# Patient Record
Sex: Male | Born: 1975
Health system: Southern US, Community
[De-identification: ages and names within clinical notes are randomized; demographics above are authoritative.]

## PROBLEM LIST (undated history)

## (undated) DIAGNOSIS — E119 Type 2 diabetes mellitus without complications: Secondary | ICD-10-CM

## (undated) DIAGNOSIS — I10 Essential (primary) hypertension: Secondary | ICD-10-CM

## (undated) DIAGNOSIS — Q893 Situs inversus: Secondary | ICD-10-CM

## (undated) DIAGNOSIS — E669 Obesity, unspecified: Secondary | ICD-10-CM

## (undated) DIAGNOSIS — E785 Hyperlipidemia, unspecified: Secondary | ICD-10-CM

## (undated) DIAGNOSIS — J45909 Unspecified asthma, uncomplicated: Secondary | ICD-10-CM

## (undated) DIAGNOSIS — R809 Proteinuria, unspecified: Secondary | ICD-10-CM

## (undated) DIAGNOSIS — E1169 Type 2 diabetes mellitus with other specified complication: Secondary | ICD-10-CM

## (undated) HISTORY — DX: Obesity, unspecified: E66.9

## (undated) HISTORY — DX: Type 2 diabetes mellitus without complications: E11.9

## (undated) HISTORY — DX: Essential (primary) hypertension: I10

## (undated) HISTORY — DX: Proteinuria, unspecified: R80.9

## (undated) HISTORY — DX: Hyperlipidemia, unspecified: E78.5

## (undated) HISTORY — DX: Type 2 diabetes mellitus with other specified complication: E11.69

## (undated) HISTORY — PX: RHINOPLASTY: SUR1284

## (undated) HISTORY — PX: NASAL SINUS SURGERY: SHX719

## (undated) HISTORY — DX: Unspecified asthma, uncomplicated: J45.909

## (undated) HISTORY — PX: BILIARY TUBE PLACEMENT (ARMC HX): HXRAD1689

---

## 2004-10-17 ENCOUNTER — Emergency Department: Payer: Self-pay | Admitting: Internal Medicine

## 2005-03-19 ENCOUNTER — Emergency Department: Payer: Self-pay | Admitting: Emergency Medicine

## 2006-10-14 ENCOUNTER — Emergency Department: Payer: Self-pay | Admitting: Emergency Medicine

## 2007-05-20 ENCOUNTER — Emergency Department: Payer: Self-pay | Admitting: Emergency Medicine

## 2007-10-14 ENCOUNTER — Emergency Department: Payer: Self-pay | Admitting: Emergency Medicine

## 2008-06-16 ENCOUNTER — Emergency Department: Payer: Self-pay | Admitting: Emergency Medicine

## 2010-05-27 ENCOUNTER — Emergency Department: Payer: Self-pay | Admitting: Emergency Medicine

## 2012-02-05 ENCOUNTER — Emergency Department: Payer: Self-pay | Admitting: Emergency Medicine

## 2012-08-03 ENCOUNTER — Emergency Department: Payer: Self-pay | Admitting: Emergency Medicine

## 2013-03-19 ENCOUNTER — Emergency Department: Payer: Self-pay | Admitting: Emergency Medicine

## 2013-05-04 ENCOUNTER — Ambulatory Visit: Payer: Self-pay | Admitting: Emergency Medicine

## 2013-05-04 LAB — DOT URINE DIP
Blood: NEGATIVE
Glucose,UR: 100 mg/dL (ref 0–75)
Protein: NEGATIVE

## 2014-04-25 ENCOUNTER — Emergency Department: Payer: Self-pay | Admitting: Emergency Medicine

## 2014-08-18 ENCOUNTER — Emergency Department: Payer: Self-pay | Admitting: Emergency Medicine

## 2014-08-18 LAB — CBC
HCT: 40.1 % (ref 40.0–52.0)
HGB: 13.5 g/dL (ref 13.0–18.0)
MCH: 28.7 pg (ref 26.0–34.0)
MCHC: 33.6 g/dL (ref 32.0–36.0)
MCV: 86 fL (ref 80–100)
PLATELETS: 324 10*3/uL (ref 150–440)
RBC: 4.69 10*6/uL (ref 4.40–5.90)
RDW: 14 % (ref 11.5–14.5)
WBC: 11.5 10*3/uL — ABNORMAL HIGH (ref 3.8–10.6)

## 2014-08-18 LAB — COMPREHENSIVE METABOLIC PANEL
ALK PHOS: 84 U/L
ALT: 21 U/L
Albumin: 3.3 g/dL — ABNORMAL LOW (ref 3.4–5.0)
Anion Gap: 9 (ref 7–16)
BUN: 17 mg/dL (ref 7–18)
Bilirubin,Total: 0.5 mg/dL (ref 0.2–1.0)
CHLORIDE: 104 mmol/L (ref 98–107)
Calcium, Total: 9 mg/dL (ref 8.5–10.1)
Co2: 25 mmol/L (ref 21–32)
Creatinine: 1.12 mg/dL (ref 0.60–1.30)
EGFR (African American): >60
EGFR (Non-African Amer.): >60
Glucose: 131 mg/dL — ABNORMAL HIGH (ref 65–99)
Osmolality: 279 (ref 275–301)
POTASSIUM: 3.7 mmol/L (ref 3.5–5.1)
SGOT(AST): 26 U/L (ref 15–37)
SODIUM: 138 mmol/L (ref 136–145)
TOTAL PROTEIN: 7.9 g/dL (ref 6.4–8.2)

## 2014-08-18 LAB — APTT: ACTIVATED PTT: 31 s (ref 23.6–35.9)

## 2014-08-18 LAB — PROTIME-INR
INR: 1
PROTHROMBIN TIME: 12.9 s (ref 11.5–14.7)

## 2015-03-30 DIAGNOSIS — I10 Essential (primary) hypertension: Secondary | ICD-10-CM | POA: Insufficient documentation

## 2015-03-30 DIAGNOSIS — J4520 Mild intermittent asthma, uncomplicated: Secondary | ICD-10-CM | POA: Insufficient documentation

## 2015-03-30 DIAGNOSIS — R809 Proteinuria, unspecified: Secondary | ICD-10-CM | POA: Insufficient documentation

## 2015-03-30 DIAGNOSIS — J45909 Unspecified asthma, uncomplicated: Secondary | ICD-10-CM

## 2015-03-30 DIAGNOSIS — H9190 Unspecified hearing loss, unspecified ear: Secondary | ICD-10-CM | POA: Insufficient documentation

## 2015-03-30 DIAGNOSIS — E1169 Type 2 diabetes mellitus with other specified complication: Secondary | ICD-10-CM

## 2015-03-30 DIAGNOSIS — E785 Hyperlipidemia, unspecified: Secondary | ICD-10-CM | POA: Insufficient documentation

## 2015-03-30 DIAGNOSIS — E66811 Obesity, class 1: Secondary | ICD-10-CM | POA: Insufficient documentation

## 2015-03-30 DIAGNOSIS — E669 Obesity, unspecified: Secondary | ICD-10-CM | POA: Insufficient documentation

## 2015-03-30 HISTORY — DX: Proteinuria, unspecified: R80.9

## 2015-03-30 HISTORY — DX: Type 2 diabetes mellitus with other specified complication: E11.69

## 2015-03-30 HISTORY — DX: Unspecified asthma, uncomplicated: J45.909

## 2015-03-30 HISTORY — DX: Type 2 diabetes mellitus with other specified complication: E66.9

## 2015-03-30 HISTORY — DX: Essential (primary) hypertension: I10

## 2015-06-13 ENCOUNTER — Ambulatory Visit (INDEPENDENT_AMBULATORY_CARE_PROVIDER_SITE_OTHER): Payer: BLUE CROSS/BLUE SHIELD | Admitting: Family Medicine

## 2015-06-13 ENCOUNTER — Encounter (INDEPENDENT_AMBULATORY_CARE_PROVIDER_SITE_OTHER): Payer: Self-pay

## 2015-06-13 ENCOUNTER — Encounter: Payer: Self-pay | Admitting: Family Medicine

## 2015-06-13 VITALS — BP 114/80 | HR 58 | Temp 97.5°F | Ht 70.0 in | Wt 223.8 lb

## 2015-06-13 DIAGNOSIS — E119 Type 2 diabetes mellitus without complications: Secondary | ICD-10-CM | POA: Diagnosis not present

## 2015-06-13 DIAGNOSIS — E785 Hyperlipidemia, unspecified: Secondary | ICD-10-CM | POA: Diagnosis not present

## 2015-06-13 DIAGNOSIS — E669 Obesity, unspecified: Secondary | ICD-10-CM

## 2015-06-13 DIAGNOSIS — I10 Essential (primary) hypertension: Secondary | ICD-10-CM | POA: Diagnosis not present

## 2015-06-13 DIAGNOSIS — E1169 Type 2 diabetes mellitus with other specified complication: Secondary | ICD-10-CM

## 2015-06-13 MED ORDER — AMLODIPINE BESYLATE 10 MG PO TABS: 10.0000 mg | ORAL_TABLET | Freq: Every day | ORAL | Status: DC

## 2015-06-13 MED ORDER — ATORVASTATIN CALCIUM 20 MG PO TABS: 20.0000 mg | ORAL_TABLET | Freq: Every day | ORAL | Status: DC

## 2015-06-13 NOTE — Progress Notes (Signed)
Name: Andrew Sawyer   MRN: [id number]    DOB: 11-12-1976   Date:06/13/2015       Progress Note  Subjective  Chief Complaint  Chief Complaint  Patient presents with  . Follow-up    diabetes and hypertension    Diabetes He presents for his follow-up diabetic visit. He has type 2 diabetes mellitus. His disease course has been stable (Last A1c of 6.1%). There are no hypoglycemic associated symptoms. Pertinent negatives for hypoglycemia include no dizziness, headaches, hunger or nervousness/anxiousness. Pertinent negatives for diabetes include no chest pain, no fatigue, no foot paresthesias, no polydipsia and no polyuria. There are no hypoglycemic complications. There are no diabetic complications. Pertinent negatives for diabetic complications include no CVA. Current diabetic treatment includes oral agent (monotherapy). His weight is stable. He is following a diabetic diet. He rarely participates in exercise. His breakfast blood glucose is taken between 8-9 am. His breakfast blood glucose range is generally 110-130 mg/dl. An ACE inhibitor/angiotensin II receptor blocker is not being taken. He does not see a podiatrist.Eye exam is not current.  Hypertension This is a chronic problem. The problem is unchanged. The problem is controlled. Pertinent negatives include no chest pain, headaches, malaise/fatigue, palpitations or peripheral edema. Past treatments include calcium channel blockers. The current treatment provides significant improvement. There are no compliance problems.  There is no history of angina, kidney disease, CAD/MI or CVA.  Hyperlipidemia This is a chronic problem. The problem is uncontrolled. Recent lipid tests were reviewed and are high. Exacerbating diseases include diabetes and obesity. He has no history of hypothyroidism. Pertinent negatives include no chest pain. Current antihyperlipidemic treatment includes statins. The current treatment provides moderate improvement of lipids.  There are no compliance problems.       Past Medical History  Diagnosis Date  . Diabetes mellitus without complication   . Hyperlipidemia   . Hypertension     Past Surgical History  Procedure Laterality Date  . Rhinoplasty      Family History  Problem Relation Age of Onset  . Diabetes Mother   . Hypertension Father   . Diabetes Brother   . Depression Paternal Grandmother   . Hypertension Paternal Grandfather   . Heart disease Paternal Grandfather   . Cancer Paternal Grandfather     Prostate    History   Social History  . Marital Status: Married    Spouse Name: N/A  . Number of Children: N/A  . Years of Education: N/A   Occupational History  . Not on file.   Social History Main Topics  . Smoking status: Never Smoker   . Smokeless tobacco: Never Used  . Alcohol Use: No  . Drug Use: No  . Sexual Activity: Not on file   Other Topics Concern  . Not on file   Social History Narrative     Current outpatient prescriptions:  .  amLODipine (NORVASC) 10 MG tablet, Take by mouth., Disp: , Rfl:  .  atorvastatin (LIPITOR) 20 MG tablet, Take by mouth., Disp: , Rfl:  .  metFORMIN (GLUCOPHAGE) 500 MG tablet, Take by mouth., Disp: , Rfl:   No Known Allergies   Review of Systems  Constitutional: Negative for malaise/fatigue and fatigue.  Cardiovascular: Negative for chest pain and palpitations.  Neurological: Negative for dizziness and headaches.  Endo/Heme/Allergies: Negative for polydipsia.  Psychiatric/Behavioral: The patient is not nervous/anxious.       Objective  Filed Vitals:   06/13/15 0803  BP: 114/80  Pulse: 58  Temp:  97.5 F (36.4 C)  TempSrc: Oral  Height:  (1.778 m)  Weight: 223 lb 12.8 oz (101.515 kg)  SpO2: 95%    Physical Exam  Constitutional: He is well-developed, well-nourished, and in no distress.  HENT:  Head: Normocephalic and atraumatic.  Eyes: Conjunctivae are normal. Pupils are equal, round, and reactive to light.   Cardiovascular: Normal rate and regular rhythm.   Pulmonary/Chest: Effort normal and breath sounds normal.  Abdominal: Soft. Bowel sounds are normal.  Nursing note and vitals reviewed.      No results found for this or any previous visit (from the past 2160 hour(s)).   Assessment & Plan 1. Benign hypertension Continue present management blood pressure is well controlled. - amLODipine (NORVASC) 10 MG tablet; Take 1 tablet (10 mg total) by mouth daily.  Dispense: 90 tablet; Refill: 0  2. Diabetes mellitus type 2 in obese Repeat A1c and patient will continue on present dose of metformin. - HgB A1c  3. Dyslipidemia  - atorvastatin (LIPITOR) 20 MG tablet; Take 1 tablet (20 mg total) by mouth daily at 6 PM.  Dispense: 90 tablet; Refill: 0 - Lipid Profile - Comprehensive Metabolic Panel (CMET)  There are no diagnoses linked to this encounter.  Odelia Graciano Asad A[facility] 06/13/2015 8:19 AM

## 2015-06-14 ENCOUNTER — Other Ambulatory Visit: Payer: Self-pay | Admitting: *Deleted

## 2015-06-14 DIAGNOSIS — R748 Abnormal levels of other serum enzymes: Secondary | ICD-10-CM

## 2015-06-14 LAB — COMPREHENSIVE METABOLIC PANEL
A/G RATIO: 2 (ref 1.1–2.5)
ALK PHOS: 84 IU/L (ref 39–117)
ALT: 53 IU/L — ABNORMAL HIGH (ref 0–44)
AST: 29 IU/L (ref 0–40)
Albumin: 5 g/dL (ref 3.5–5.5)
BILIRUBIN TOTAL: 0.6 mg/dL (ref 0.0–1.2)
BUN / CREAT RATIO: 17 (ref 9–20)
BUN: 17 mg/dL (ref 6–24)
CO2: 24 mmol/L (ref 18–29)
CREATININE: 1.01 mg/dL (ref 0.76–1.27)
Calcium: 9.9 mg/dL (ref 8.7–10.2)
Chloride: 99 mmol/L (ref 97–108)
GFR calc non Af Amer: 93 mL/min/{1.73_m2} (ref >59)
GFR, EST AFRICAN AMERICAN: 107 mL/min/{1.73_m2} (ref >59)
GLOBULIN, TOTAL: 2.5 g/dL (ref 1.5–4.5)
Glucose: 122 mg/dL — ABNORMAL HIGH (ref 65–99)
Potassium: 4.6 mmol/L (ref 3.5–5.2)
Sodium: 141 mmol/L (ref 134–144)
Total Protein: 7.5 g/dL (ref 6.0–8.5)

## 2015-06-14 LAB — HEMOGLOBIN A1C
ESTIMATED AVERAGE GLUCOSE: 131 mg/dL
Hgb A1c MFr Bld: 6.2 % — ABNORMAL HIGH (ref 4.8–5.6)

## 2015-06-14 LAB — LIPID PANEL
Chol/HDL Ratio: 4.1 ratio units (ref 0.0–5.0)
Cholesterol, Total: 150 mg/dL (ref 100–199)
HDL: 37 mg/dL — ABNORMAL LOW (ref >39)
LDL Calculated: 81 mg/dL (ref 0–99)
Triglycerides: 159 mg/dL — ABNORMAL HIGH (ref 0–149)
VLDL Cholesterol Cal: 32 mg/dL (ref 5–40)

## 2015-06-14 NOTE — Progress Notes (Signed)
Called pt., informed of lab results and need to have CMP in 4 weeks.  Issued order for future labs.

## 2015-07-11 ENCOUNTER — Other Ambulatory Visit: Payer: Self-pay | Admitting: Family Medicine

## 2015-07-12 LAB — COMPREHENSIVE METABOLIC PANEL
ALBUMIN: 4.6 g/dL (ref 3.5–5.5)
ALT: 70 IU/L — AB (ref 0–44)
AST: 38 IU/L (ref 0–40)
Albumin/Globulin Ratio: 1.8 (ref 1.1–2.5)
Alkaline Phosphatase: 81 IU/L (ref 39–117)
BUN/Creatinine Ratio: 13 (ref 9–20)
BUN: 14 mg/dL (ref 6–24)
Bilirubin Total: 0.6 mg/dL (ref 0.0–1.2)
CALCIUM: 9.7 mg/dL (ref 8.7–10.2)
CO2: 22 mmol/L (ref 18–29)
Chloride: 101 mmol/L (ref 97–108)
Creatinine, Ser: 1.09 mg/dL (ref 0.76–1.27)
GFR calc non Af Amer: 84 mL/min/{1.73_m2} (ref >59)
GFR, EST AFRICAN AMERICAN: 98 mL/min/{1.73_m2} (ref >59)
GLOBULIN, TOTAL: 2.6 g/dL (ref 1.5–4.5)
Glucose: 130 mg/dL — ABNORMAL HIGH (ref 65–99)
Potassium: 4.5 mmol/L (ref 3.5–5.2)
Sodium: 141 mmol/L (ref 134–144)
TOTAL PROTEIN: 7.2 g/dL (ref 6.0–8.5)

## 2015-07-25 ENCOUNTER — Encounter: Payer: Self-pay | Admitting: Family Medicine

## 2015-07-25 ENCOUNTER — Ambulatory Visit (INDEPENDENT_AMBULATORY_CARE_PROVIDER_SITE_OTHER): Payer: BLUE CROSS/BLUE SHIELD | Admitting: Family Medicine

## 2015-07-25 VITALS — BP 119/68 | HR 66 | Temp 97.9°F | Resp 19 | Ht 70.0 in | Wt 223.6 lb

## 2015-07-25 DIAGNOSIS — R748 Abnormal levels of other serum enzymes: Secondary | ICD-10-CM | POA: Insufficient documentation

## 2015-07-25 NOTE — Progress Notes (Signed)
Name: Andrew Sawyer   MRN: [id number]    DOB: [birth year]   Date:07/25/2015       Progress Note  Subjective  Chief Complaint  Chief Complaint  Patient presents with  . Annual Exam    CPE  . Diabetes  . Asthma  . Hyperlipidemia  . Hypertension    HPI Elevated liver enzymes. ALT is progressively elevated from 53 in June 2016 to 70 in July 2016. He has no hx of liver disease know, denies alcohol use, takes Tylenol 500 mg approx 10 times a month. No nausea, vomiting, jaundice, abdominal pain, or other symptoms. Past Medical History  Diagnosis Date  . Diabetes mellitus without complication   . Hyperlipidemia   . Hypertension     Past Surgical History  Procedure Laterality Date  . Rhinoplasty      Family History  Problem Relation Age of Onset  . Diabetes Mother   . Hypertension Father   . Diabetes Brother   . Depression Paternal Grandmother   . Hypertension Paternal Grandfather   . Heart disease Paternal Grandfather   . Cancer Paternal Grandfather     Prostate    History   Social History  . Marital Status: Married    Spouse Name: N/A  . Number of Children: N/A  . Years of Education: N/A   Occupational History  . Not on file.   Social History Main Topics  . Smoking status: Never Smoker   . Smokeless tobacco: Never Used  . Alcohol Use: No  . Drug Use: No  . Sexual Activity: Not on file   Other Topics Concern  . Not on file   Social History Narrative     Current outpatient prescriptions:  .  amLODipine (NORVASC) 10 MG tablet, Take 1 tablet (10 mg total) by mouth daily., Disp: 90 tablet, Rfl: 0 .  aspirin 81 MG chewable tablet, Chew 81 mg by mouth daily., Disp: , Rfl:  .  atorvastatin (LIPITOR) 20 MG tablet, Take 1 tablet (20 mg total) by mouth daily at 6 PM., Disp: 90 tablet, Rfl: 0 .  metFORMIN (GLUCOPHAGE) 500 MG tablet, Take by mouth., Disp: , Rfl:   No Known Allergies   Review of Systems  Constitutional: Negative for fever, chills and weight  loss.  Gastrointestinal: Negative for nausea, vomiting, abdominal pain and blood in stool.  Genitourinary: Negative for dysuria, urgency and frequency.  Musculoskeletal: Negative for back pain and neck pain.     Objective  Filed Vitals:   07/25/15 1137  BP: 119/68  Pulse: 66  Temp: 97.9 F (36.6 C)  TempSrc: Oral  Resp: 19  Height: 5\' 10"  (1.778 m)  Weight: 223 lb 9.6 oz (101.424 kg)  SpO2: 96%    Physical Exam  Constitutional: He is well-developed, well-nourished, and in no distress.  Cardiovascular: Normal rate and regular rhythm.   Pulmonary/Chest: Effort normal and breath sounds normal.  Abdominal: Soft. Bowel sounds are normal. There is no hepatomegaly. There is no tenderness.  Pt. Has situs inversus and he has been told that all internal organs are opposite of where they should be.  Nursing note and vitals reviewed.    Assessment & Plan 1. Elevated liver enzymes Obtain workup for elevated liver enzymes. We will advise on statin therapy after obtaining repeat enzymes today. - Basic Metabolic Panel (BMET) - Hepatic function panel - Gamma GT - Hepatitis panel, acute - US Abdomen Complete; Future    Sayda Grable [facility] 07/25/2015 12:29 PM

## 2015-07-26 LAB — HEPATIC FUNCTION PANEL
ALK PHOS: 94 IU/L (ref 39–117)
ALT: 79 IU/L — AB (ref 0–44)
AST: 43 IU/L — AB (ref 0–40)
Albumin: 5.1 g/dL (ref 3.5–5.5)
Bilirubin Total: 0.6 mg/dL (ref 0.0–1.2)
Bilirubin, Direct: 0.18 mg/dL (ref 0.00–0.40)
TOTAL PROTEIN: 8 g/dL (ref 6.0–8.5)

## 2015-07-26 LAB — BASIC METABOLIC PANEL
BUN/Creatinine Ratio: 16 (ref 9–20)
BUN: 16 mg/dL (ref 6–24)
CHLORIDE: 97 mmol/L (ref 97–108)
CO2: 25 mmol/L (ref 18–29)
CREATININE: 1 mg/dL (ref 0.76–1.27)
Calcium: 10.3 mg/dL — ABNORMAL HIGH (ref 8.7–10.2)
GFR calc Af Amer: 108 mL/min/{1.73_m2} (ref >59)
GFR, EST NON AFRICAN AMERICAN: 94 mL/min/{1.73_m2} (ref >59)
Glucose: 98 mg/dL (ref 65–99)
POTASSIUM: 4.8 mmol/L (ref 3.5–5.2)
SODIUM: 139 mmol/L (ref 134–144)

## 2015-07-26 LAB — HEPATITIS PANEL, ACUTE
Hep A IgM: NEGATIVE
Hep B C IgM: NEGATIVE
Hepatitis B Surface Ag: NEGATIVE

## 2015-07-26 LAB — GAMMA GT: GGT: 72 IU/L — AB (ref 0–65)

## 2015-07-29 ENCOUNTER — Ambulatory Visit
Admission: RE | Admit: 2015-07-29 | Discharge: 2015-07-29 | Disposition: A | Discharge status: Home / Self Care | Payer: BLUE CROSS/BLUE SHIELD | Source: Ambulatory Visit | Attending: Family Medicine | Admitting: Family Medicine

## 2015-07-29 DIAGNOSIS — R945 Abnormal results of liver function studies: Secondary | ICD-10-CM | POA: Insufficient documentation

## 2015-07-29 DIAGNOSIS — Q893 Situs inversus: Secondary | ICD-10-CM | POA: Diagnosis present

## 2015-07-29 DIAGNOSIS — K769 Liver disease, unspecified: Secondary | ICD-10-CM | POA: Diagnosis not present

## 2015-07-29 DIAGNOSIS — R748 Abnormal levels of other serum enzymes: Secondary | ICD-10-CM

## 2015-08-15 ENCOUNTER — Other Ambulatory Visit: Payer: Self-pay | Admitting: Family Medicine

## 2015-08-15 DIAGNOSIS — R748 Abnormal levels of other serum enzymes: Secondary | ICD-10-CM

## 2015-08-16 LAB — COMPREHENSIVE METABOLIC PANEL
ALBUMIN: 4.6 g/dL (ref 3.5–5.5)
ALT: 89 IU/L — ABNORMAL HIGH (ref 0–44)
AST: 43 IU/L — AB (ref 0–40)
Albumin/Globulin Ratio: 1.7 (ref 1.1–2.5)
Alkaline Phosphatase: 80 IU/L (ref 39–117)
BUN / CREAT RATIO: 18 (ref 9–20)
BUN: 19 mg/dL (ref 6–24)
Bilirubin Total: 0.5 mg/dL (ref 0.0–1.2)
CALCIUM: 9.5 mg/dL (ref 8.7–10.2)
CO2: 23 mmol/L (ref 18–29)
CREATININE: 1.06 mg/dL (ref 0.76–1.27)
Chloride: 99 mmol/L (ref 97–108)
GFR calc Af Amer: 101 mL/min/{1.73_m2} (ref >59)
GFR calc non Af Amer: 87 mL/min/{1.73_m2} (ref >59)
GLOBULIN, TOTAL: 2.7 g/dL (ref 1.5–4.5)
Glucose: 129 mg/dL — ABNORMAL HIGH (ref 65–99)
Potassium: 4.3 mmol/L (ref 3.5–5.2)
SODIUM: 141 mmol/L (ref 134–144)
Total Protein: 7.3 g/dL (ref 6.0–8.5)

## 2015-08-17 NOTE — Addendum Note (Signed)
Addended bySherryll Burger, Charon Akamine A A on: 08/17/2015 06:33 PM   Modules accepted: Orders

## 2015-08-19 ENCOUNTER — Encounter: Payer: Self-pay | Admitting: Gastroenterology

## 2015-08-21 ENCOUNTER — Emergency Department: Payer: BLUE CROSS/BLUE SHIELD

## 2015-08-21 ENCOUNTER — Encounter: Payer: Self-pay | Admitting: Emergency Medicine

## 2015-08-21 ENCOUNTER — Emergency Department
Admission: EM | Admit: 2015-08-21 | Discharge: 2015-08-21 | Disposition: A | Discharge status: Home / Self Care | Payer: BLUE CROSS/BLUE SHIELD | Attending: Emergency Medicine | Admitting: Emergency Medicine

## 2015-08-21 DIAGNOSIS — E119 Type 2 diabetes mellitus without complications: Secondary | ICD-10-CM | POA: Insufficient documentation

## 2015-08-21 DIAGNOSIS — Z79899 Other long term (current) drug therapy: Secondary | ICD-10-CM | POA: Diagnosis not present

## 2015-08-21 DIAGNOSIS — M545 Low back pain, unspecified: Secondary | ICD-10-CM

## 2015-08-21 DIAGNOSIS — Z87828 Personal history of other (healed) physical injury and trauma: Secondary | ICD-10-CM | POA: Diagnosis not present

## 2015-08-21 DIAGNOSIS — Z7982 Long term (current) use of aspirin: Secondary | ICD-10-CM | POA: Insufficient documentation

## 2015-08-21 DIAGNOSIS — I10 Essential (primary) hypertension: Secondary | ICD-10-CM | POA: Insufficient documentation

## 2015-08-21 MED ORDER — DIAZEPAM 2 MG PO TABS
2.0000 mg | ORAL_TABLET | Freq: Once | ORAL | Status: AC
  Administered 2015-08-21: 2 mg via ORAL
  Filled 2015-08-21: qty 1

## 2015-08-21 MED ORDER — ETODOLAC 400 MG PO TABS: 400.0000 mg | ORAL_TABLET | Freq: Two times a day (BID) | ORAL | Status: DC

## 2015-08-21 MED ORDER — KETOROLAC TROMETHAMINE 60 MG/2ML IM SOLN
60.0000 mg | Freq: Once | INTRAMUSCULAR | Status: AC
  Administered 2015-08-21: 60 mg via INTRAMUSCULAR
  Filled 2015-08-21: qty 2

## 2015-08-21 NOTE — ED Notes (Signed)
AAOx3.  Skin warm and dry.  NAD.  Ambulates with easy and steady gait.  Moving all extremities.  Posture relaxed.

## 2015-08-21 NOTE — ED Provider Notes (Signed)
[facility] Emergency Department Provider Note  ____________________________________________  Time seen: Approximately 9:30 PM  I have reviewed the triage vital signs and the nursing notes.   HISTORY  Chief Complaint Back Pain   HPI Andrew Sawyer is a 39 y.o. male is here with complaint of back pain for the last week. He states he has not had any recent injury but does recall he had an injury 15 years ago. He generally sees chiropractor for this. He has taken occasional over-the-counter medication but not recently. He denies any urinary symptoms. There is been no incontinence of bowel or bladder. He denies any paresthesias into his lower extremities. He continues to walk without any difficulty. Currently his pain is 7 out of 10.   Past Medical History  Diagnosis Date  . Diabetes mellitus without complication   . Hyperlipidemia   . Hypertension     Patient Active Problem List   Diagnosis Date Noted  . Elevated liver enzymes 07/25/2015  . Asthma, mild 03/30/2015  . Diabetes mellitus type 2 in obese 03/30/2015  . Routine general medical examination at a health care facility 03/30/2015  . Difficulty hearing 03/30/2015  . Brash 03/30/2015  . Elevated cholesterol with elevated triglycerides 03/30/2015  . Blood glucose elevated 03/30/2015  . Benign hypertension 03/30/2015  . Flu vaccine need 03/30/2015  . Adiposity 03/30/2015  . Pneumococcal vaccination given 03/30/2015  . Abnormal presence of protein in urine 03/30/2015    Past Surgical History  Procedure Laterality Date  . Rhinoplasty    . Nasal sinus surgery      Current Outpatient Rx  Name  Route  Sig  Dispense  Refill  . amLODipine (NORVASC) 10 MG tablet   Oral   Take 1 tablet (10 mg total) by mouth daily.   90 tablet   0   . aspirin 81 MG chewable tablet   Oral   Chew 81 mg by mouth daily.         Marland Kitchen atorvastatin (LIPITOR) 20 MG tablet   Oral   Take 1 tablet (20 mg total) by  mouth daily at 6 PM.   90 tablet   0   . etodolac (LODINE) 400 MG tablet   Oral   Take 1 tablet (400 mg total) by mouth 2 (two) times daily.   20 tablet   0   . metFORMIN (GLUCOPHAGE) 500 MG tablet   Oral   Take by mouth.           Allergies Review of patient's allergies indicates no known allergies.  Family History  Problem Relation Age of Onset  . Diabetes Mother   . Hypertension Father   . Diabetes Brother   . Depression Paternal Grandmother   . Hypertension Paternal Grandfather   . Heart disease Paternal Grandfather   . Cancer Paternal Grandfather     Prostate    Social History Social History  Substance Use Topics  . Smoking status: Never Smoker   . Smokeless tobacco: Never Used  . Alcohol Use: No    Review of Systems Constitutional: No fever/chills Cardiovascular: Denies chest pain. Respiratory: Denies shortness of breath. Gastrointestinal: No abdominal pain.  No nausea, no vomiting.  Genitourinary: Negative for dysuria. Musculoskeletal: Positive for back pain. Skin: Negative for rash. Neurological: Negative for headaches, focal weakness or numbness. 10-point ROS otherwise negative.  ____________________________________________   PHYSICAL EXAM:  VITAL SIGNS: ED Triage Vitals  Enc Vitals Group     BP 08/21/15 2038 132/83 mmHg  Pulse Rate 08/21/15 2038 85     Resp 08/21/15 2038 18     Temp 08/21/15 2038 98.2 F (36.8 C)     Temp Source 08/21/15 2038 Oral     SpO2 08/21/15 2038 96 %     Weight 08/21/15 2038 220 lb (99.791 kg)     Height 08/21/15 2038 5\' 10"  (1.778 m)     Head Cir --      Peak Flow --      Pain Score 08/21/15 2039 7     Pain Loc --      Pain Edu? --      Excl. in GC? --     Constitutional: Alert and oriented. Well appearing and in no acute distress. Eyes: Conjunctivae are normal. PERRL. EOMI. Head: Atraumatic. Nose: No congestion/rhinnorhea. Neck: No stridor.   Cardiovascular: Normal rate, regular rhythm. Grossly  normal heart sounds.  Good peripheral circulation. Respiratory: Normal respiratory effort.  No retractions. Lungs CTAB. Gastrointestinal: Soft and nontender. No distention.  No CVA tenderness. Musculoskeletal: Back exam no gross deformity but range of motion is slightly restricted secondary to pain. There is no active muscle spasm seen. Moderate tenderness on palpation of the lumbar spine along with bilateral paravertebral muscles. Straight leg raises were negative. No lower extremity tenderness nor edema.  No joint effusions. Neurologic:  Normal speech and language. No gross focal neurologic deficits are appreciated. No gait instability. Reflexes 1+ bilaterally Skin:  Skin is warm, dry and intact. No rash noted. Psychiatric: Mood and affect are normal. Speech and behavior are normal.  ____________________________________________   LABS (all labs ordered are listed, but only abnormal results are displayed)  Labs Reviewed - No data to display _RADIOLOGY  Lumbar spine per radiologist negative ____________________________________________   PROCEDURES  Procedure(s) performed: None  Critical Care performed: No  ____________________________________________   INITIAL IMPRESSION / ASSESSMENT AND PLAN / ED COURSE  Pertinent labs & imaging results that were available during my care of the patient were reviewed by me and considered in my medical decision making (see chart for details).  Patient was reassured that his x-rays were normal. Patient was started on etodolac after receiving Toradol 60 mg IM in the emergency room. He is to follow-up with Dr. Hyacinth Meeker if any continued problems with his back. ____________________________________________   FINAL CLINICAL IMPRESSION(S) / ED DIAGNOSES  Final diagnoses:  Bilateral low back pain without sciatica      Tommi Rumps, PA-C 08/21/15 2302  Emily Filbert, MD 08/22/15 [phone]

## 2015-08-21 NOTE — ED Notes (Signed)
Pt. States chronic back pain worse in the past week.

## 2015-09-12 ENCOUNTER — Ambulatory Visit: Payer: BLUE CROSS/BLUE SHIELD | Admitting: Family Medicine

## 2015-09-12 ENCOUNTER — Other Ambulatory Visit: Payer: Self-pay | Admitting: Family Medicine

## 2015-09-12 ENCOUNTER — Telehealth: Payer: Self-pay | Admitting: Family Medicine

## 2015-09-12 DIAGNOSIS — E669 Obesity, unspecified: Secondary | ICD-10-CM

## 2015-09-12 DIAGNOSIS — I10 Essential (primary) hypertension: Secondary | ICD-10-CM

## 2015-09-12 DIAGNOSIS — E1169 Type 2 diabetes mellitus with other specified complication: Secondary | ICD-10-CM

## 2015-09-12 MED ORDER — AMLODIPINE BESYLATE 10 MG PO TABS: 10.0000 mg | ORAL_TABLET | Freq: Every day | ORAL | Status: DC

## 2015-09-12 MED ORDER — METFORMIN HCL 500 MG PO TABS: 500.0000 mg | ORAL_TABLET | Freq: Two times a day (BID) | ORAL | Status: DC

## 2015-09-12 NOTE — Telephone Encounter (Signed)
Prescription for metformin and amlodipine sent to pharmacy

## 2015-09-12 NOTE — Telephone Encounter (Signed)
Patient is completely out of Metformin  and amlodipine . Please send to [facility] [address]. 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

## 2015-09-12 NOTE — Telephone Encounter (Signed)
Pt aware that medication refill has been completed.[place]

## 2015-09-26 ENCOUNTER — Encounter: Payer: Self-pay | Admitting: Family Medicine

## 2015-09-26 ENCOUNTER — Other Ambulatory Visit: Payer: Self-pay

## 2015-09-26 ENCOUNTER — Encounter: Payer: Self-pay | Admitting: Gastroenterology

## 2015-09-26 ENCOUNTER — Ambulatory Visit (INDEPENDENT_AMBULATORY_CARE_PROVIDER_SITE_OTHER): Payer: BLUE CROSS/BLUE SHIELD | Admitting: Gastroenterology

## 2015-09-26 ENCOUNTER — Ambulatory Visit (INDEPENDENT_AMBULATORY_CARE_PROVIDER_SITE_OTHER): Payer: BLUE CROSS/BLUE SHIELD | Admitting: Family Medicine

## 2015-09-26 VITALS — BP 143/75 | HR 78 | Temp 97.9°F | Ht 70.0 in | Wt 226.0 lb

## 2015-09-26 VITALS — BP 130/77 | HR 87 | Temp 98.1°F | Resp 18 | Ht 70.0 in | Wt 226.3 lb

## 2015-09-26 DIAGNOSIS — I10 Essential (primary) hypertension: Secondary | ICD-10-CM | POA: Diagnosis not present

## 2015-09-26 DIAGNOSIS — E669 Obesity, unspecified: Secondary | ICD-10-CM | POA: Diagnosis not present

## 2015-09-26 DIAGNOSIS — E1169 Type 2 diabetes mellitus with other specified complication: Secondary | ICD-10-CM

## 2015-09-26 DIAGNOSIS — R748 Abnormal levels of other serum enzymes: Secondary | ICD-10-CM | POA: Diagnosis not present

## 2015-09-26 DIAGNOSIS — E119 Type 2 diabetes mellitus without complications: Secondary | ICD-10-CM | POA: Diagnosis not present

## 2015-09-26 LAB — POCT GLYCOSYLATED HEMOGLOBIN (HGB A1C): Hemoglobin A1C: 5.9

## 2015-09-26 NOTE — Progress Notes (Signed)
Gastroenterology Consultation  Referring Provider:     Ellyn Hack, MD Primary Care Physician:  Brayton El, MD Primary Gastroenterologist:  Dr. Servando Snare     Reason for Consultation:     Abnormal Liver enzymes        HPI:   Andrew Sawyer is a 39 y.o. y/o male referred for consultation & management of abnormal liver enzymes by Dr. Brayton El, MD.  This patient comes today with a history of Situs inversus totalis. He was found to have abnormal liver enzymes. The patient suffers from hypercholesterolemia and diabetes. The patient is also overweight. The patient denies any alcohol abuse. There is no report of any abdominal pain nausea vomiting fevers or chills. The patient does report that he has long-standing heartburn controlled by diet and over-the-counter acid suppression medication. The patient has not had any unexplained weight loss. He denies ever being told that he had abnormal liver enzymes in the past. The patient's hepatitis A, B, and C were tested and were negative.  Past Medical History  Diagnosis Date  . Diabetes mellitus without complication (HCC)   . Hyperlipidemia   . Hypertension   . Benign hypertension 03/30/2015  . Asthma, mild 03/30/2015  . Diabetes mellitus type 2 in obese (HCC) 03/30/2015    Past Surgical History  Procedure Laterality Date  . Rhinoplasty    . Nasal sinus surgery      Prior to Admission medications   Medication Sig Start Date End Date Taking? Authorizing Provider  amLODipine (NORVASC) 10 MG tablet Take 1 tablet (10 mg total) by mouth daily. 09/12/15  Yes Ellyn Hack, MD  aspirin 81 MG chewable tablet Chew 81 mg by mouth daily.   Yes Historical Provider, MD  metFORMIN (GLUCOPHAGE) 500 MG tablet Take 1 tablet (500 mg total) by mouth 2 (two) times daily with a meal. 09/12/15  Yes Ellyn Hack, MD  atorvastatin (LIPITOR) 20 MG tablet Take 1 tablet (20 mg total) by mouth daily at 6 PM. Patient not taking: Reported on 09/26/2015 06/13/15   Ellyn Hack, MD  etodolac (LODINE) 400 MG tablet Take 1 tablet (400 mg total) by mouth 2 (two) times daily. Patient not taking: Reported on 09/26/2015 08/21/15   Tommi Rumps, PA-C    Family History  Problem Relation Age of Onset  . Diabetes Mother   . Hypertension Father   . Diabetes Brother   . Depression Paternal Grandmother   . Hypertension Paternal Grandfather   . Heart disease Paternal Grandfather   . Cancer Paternal Grandfather     Prostate     Social History  Substance Use Topics  . Smoking status: Never Smoker   . Smokeless tobacco: Never Used  . Alcohol Use: No    Allergies as of 09/26/2015  . (No Known Allergies)    Review of Systems:    All systems reviewed and negative except where noted in HPI.   Physical Exam:  BP 143/75 mmHg  Pulse 78  Temp(Src) 97.9 F (36.6 C) (Oral)  Ht  (1.778 m)  Wt 226 lb (102.513 kg)  BMI 32.43 kg/m2 No LMP for male patient. Psych:  Alert and cooperative. Normal mood and affect. General:   Alert,  Well-developed, well-nourished, pleasant and cooperative in NAD Head:  Normocephalic and atraumatic. Eyes:  Sclera clear, no icterus.   Conjunctiva pink. Ears:  Normal auditory acuity. Nose:  No deformity, discharge, or lesions. Mouth:  No deformity  or lesions,oropharynx pink & moist. Neck:  Supple; no masses or thyromegaly. Lungs:  Respirations even and unlabored.  Clear throughout to auscultation.   No wheezes, crackles, or rhonchi. No acute distress. Heart:  Regular rate and rhythm; no murmurs, clicks, rubs, or gallops. Of note the heart was on the opposite side Abdomen:  Normal bowel sounds.  No bruits.  Soft, non-tender and non-distended without masses, hepatosplenomegaly or hernias noted.  No guarding or rebound tenderness.  Negative Carnett sign.  Of note the intestinal organs were on the opposite side Rectal:  Deferred.  Msk:  Symmetrical without gross deformities.  Good, equal movement & strength bilaterally. Pulses:   Normal pulses noted. Extremities:  No clubbing or edema.  No cyanosis. Neurologic:  Alert and oriented x3;  grossly normal neurologically. Skin:  Intact without significant lesions or rashes.  No jaundice. Lymph Nodes:  No significant cervical adenopathy. Psych:  Alert and cooperative. Normal mood and affect.  Imaging Studies: No results found.  Assessment and Plan:   Andrew Sawyer is a 39 y.o. y/o male who comes in today with a ultrasound showed fatty liver and abnormal liver enzymes. The patient also has hypercholesterolemia and diabetes. The patient will have his blood sent off for other possible causes of abnormal liver enzymes. He will also have his liver enzymes checked again. If everything is negative the patient will likely have nonalcoholic steatohepatitis and has been told to keep his cholesterol diabetes and weight under control to best decrease the amount of fat in his liver. The patient has been explained the plan and agrees with it.   Note: This dictation was prepared with Dragon dictation along with smaller phrase technology. Any transcriptional errors that result from this process are unintentional.

## 2015-09-26 NOTE — Progress Notes (Signed)
Name: Andrew Sawyer   MRN: [id number]    DOB: [birth year]   Date:09/26/2015       Progress Note  Subjective  Chief Complaint  Chief Complaint  Patient presents with  . Follow-up    3 mo  . Diabetes    Diabetes He presents for his follow-up diabetic visit. He has type 2 diabetes mellitus. There are no hypoglycemic associated symptoms. Pertinent negatives for hypoglycemia include no headaches. Pertinent negatives for diabetes include no blurred vision, no chest pain, no fatigue, no polydipsia and no polyuria. Pertinent negatives for diabetic complications include no CVA. Current diabetic treatment includes oral agent (monotherapy). His weight is stable. His breakfast blood glucose range is generally 110-130 mg/dl.  Hypertension This is a chronic problem. The problem is unchanged. The problem is controlled. Pertinent negatives include no blurred vision, chest pain, headaches, palpitations or shortness of breath. Past treatments include calcium channel blockers. There is no history of kidney disease, CAD/MI or CVA.  Hyperlipidemia This is a chronic problem. The problem is controlled. Recent lipid tests were reviewed and are normal. Exacerbating diseases include diabetes. Pertinent negatives include no chest pain or shortness of breath. Current antihyperlipidemic treatment includes statins (Lipitor on hold until work up for elevated liver enzymes is complete.).   Past Medical History  Diagnosis Date  . Diabetes mellitus without complication (HCC)   . Hyperlipidemia   . Hypertension   . Benign hypertension 03/30/2015  . Asthma, mild 03/30/2015  . Diabetes mellitus type 2 in obese (HCC) 03/30/2015    Past Surgical History  Procedure Laterality Date  . Rhinoplasty    . Nasal sinus surgery     Family History  Problem Relation Age of Onset  . Diabetes Mother   . Hypertension Father   . Diabetes Brother   . Depression Paternal Grandmother   . Hypertension Paternal Grandfather   . Heart  disease Paternal Grandfather   . Cancer Paternal Grandfather     Prostate    Social History   Social History  . Marital Status: Married    Spouse Name: N/A  . Number of Children: N/A  . Years of Education: N/A   Occupational History  . Not on file.   Social History Main Topics  . Smoking status: Never Smoker   . Smokeless tobacco: Never Used  . Alcohol Use: No  . Drug Use: No  . Sexual Activity: Not on file   Other Topics Concern  . Not on file   Social History Narrative     Current outpatient prescriptions:  .  amLODipine (NORVASC) 10 MG tablet, Take 1 tablet (10 mg total) by mouth daily., Disp: 90 tablet, Rfl: 0 .  aspirin 81 MG chewable tablet, Chew 81 mg by mouth daily., Disp: , Rfl:  .  atorvastatin (LIPITOR) 20 MG tablet, Take 1 tablet (20 mg total) by mouth daily at 6 PM., Disp: 90 tablet, Rfl: 0 .  etodolac (LODINE) 400 MG tablet, Take 1 tablet (400 mg total) by mouth 2 (two) times daily., Disp: 20 tablet, Rfl: 0 .  metFORMIN (GLUCOPHAGE) 500 MG tablet, Take 1 tablet (500 mg total) by mouth 2 (two) times daily with a meal., Disp: 180 tablet, Rfl: 0  No Known Allergies  Review of Systems  Constitutional: Negative for fatigue.  Eyes: Negative for blurred vision.  Respiratory: Negative for shortness of breath.   Cardiovascular: Negative for chest pain and palpitations.  Neurological: Negative for headaches.  Endo/Heme/Allergies: Negative for polydipsia.  Objective  Filed Vitals:   09/26/15 1231  BP: 130/77  Pulse: 87  Temp: 98.1 F (36.7 C)  TempSrc: Oral  Resp: 18  Height:  (1.778 m)  Weight: 226 lb 4.8 oz (102.649 kg)  SpO2: 94%    Physical Exam  Constitutional: He is well-developed, well-nourished, and in no distress.  HENT:  Head: Normocephalic and atraumatic.  Cardiovascular: Normal rate and regular rhythm.   Pulmonary/Chest: Effort normal and breath sounds normal.  Nursing note and vitals reviewed.  Assessment & Plan  1.  Diabetes mellitus type 2 in obese (HCC)  - POCT HgB A1C  2. Benign hypertension Blood pressure is stable and controlled on present therapy.  3. Elevated liver enzymes Patient he'll follow up with gastroenterology for elevated liver enzymes. Lipitor is on hold for now until further workup is complete.     Savahna Casados Asad A[facility] 09/26/2015 12:54 PM

## 2015-09-27 LAB — FERRITIN: FERRITIN: 690 ng/mL — AB (ref 30–400)

## 2015-09-27 LAB — HEPATIC FUNCTION PANEL
ALBUMIN: 4.8 g/dL (ref 3.5–5.5)
ALK PHOS: 86 IU/L (ref 39–117)
ALT: 80 IU/L — ABNORMAL HIGH (ref 0–44)
AST: 48 IU/L — ABNORMAL HIGH (ref 0–40)
BILIRUBIN TOTAL: 0.7 mg/dL (ref 0.0–1.2)
BILIRUBIN, DIRECT: 0.18 mg/dL (ref 0.00–0.40)
TOTAL PROTEIN: 7.8 g/dL (ref 6.0–8.5)

## 2015-09-27 LAB — CERULOPLASMIN: CERULOPLASMIN: 29.2 mg/dL (ref 16.0–31.0)

## 2015-09-27 LAB — EXTRACTABLE NUCLEAR ANTIGEN ANTIBODY
ENA RNP Ab: 0.6 AI (ref 0.0–0.9)
ENA SM Ab Ser-aCnc: <0.2 AI (ref 0.0–0.9)
ENA SSA (RO) Ab: <0.2 AI (ref 0.0–0.9)
ENA SSB (LA) Ab: <0.2 AI (ref 0.0–0.9)
Scleroderma SCL-70: <0.2 AI (ref 0.0–0.9)

## 2015-09-27 LAB — ALPHA-1-ANTITRYPSIN: A1 ANTITRYPSIN: 131 mg/dL (ref 90–200)

## 2015-09-27 LAB — IRON AND TIBC
IRON SATURATION: 19 % (ref 15–55)
Iron: 62 ug/dL (ref 38–169)
Total Iron Binding Capacity: 319 ug/dL (ref 250–450)
UIBC: 257 ug/dL (ref 111–343)

## 2015-09-27 LAB — ANTI-SMOOTH MUSCLE ANTIBODY, IGG: SMOOTH MUSCLE AB: 9 U (ref 0–19)

## 2015-09-27 LAB — MITOCHONDRIAL ANTIBODIES: Mitochondrial Ab: 23.1 Units — ABNORMAL HIGH (ref 0.0–20.0)

## 2015-10-04 ENCOUNTER — Other Ambulatory Visit: Payer: Self-pay

## 2015-10-04 ENCOUNTER — Telehealth: Payer: Self-pay

## 2015-10-04 DIAGNOSIS — R748 Abnormal levels of other serum enzymes: Secondary | ICD-10-CM

## 2015-10-04 NOTE — Telephone Encounter (Signed)
Pt notified of results. Contacted Revonda Standard in Radiology to schedule liver biopsy. She will call me back this afternoon with date.

## 2015-10-04 NOTE — Telephone Encounter (Signed)
Liver biopsy scheduled for Monday, Oct 17th at 10:00am at [facility]. Pt was given instructions for prep as well as date, time and location. All questions were answered.

## 2015-10-04 NOTE — Telephone Encounter (Signed)
-----   Message from Darren Wohl, MD sent at 10/04/2015  6:14 AM EDT ----- Let the patient know that one of his blood test was weakly positive for another possible cause of the increased liver enzymes besides fatty liver. The treatment would be different and he should be set up for a liver biopsy to see if he has it. 

## 2015-10-05 ENCOUNTER — Telehealth: Payer: Self-pay

## 2015-10-05 NOTE — Telephone Encounter (Signed)
Pt notified. See other messages for liver biopsy appt information.

## 2015-10-05 NOTE — Telephone Encounter (Signed)
-----   Message from Midge Miniumarren Wohl, MD sent at 10/04/2015  6:14 AM EDT ----- Let the patient know that one of his blood test was weakly positive for another possible cause of the increased liver enzymes besides fatty liver. The treatment would be different and he should be set up for a liver biopsy to see if he has it.

## 2015-10-07 ENCOUNTER — Other Ambulatory Visit: Payer: Self-pay | Admitting: Radiology

## 2015-10-10 ENCOUNTER — Ambulatory Visit
Admission: RE | Admit: 2015-10-10 | Discharge: 2015-10-10 | Disposition: A | Discharge status: Home / Self Care | Payer: BLUE CROSS/BLUE SHIELD | Source: Ambulatory Visit | Attending: Gastroenterology | Admitting: Gastroenterology

## 2015-10-10 DIAGNOSIS — R748 Abnormal levels of other serum enzymes: Secondary | ICD-10-CM

## 2015-10-10 HISTORY — DX: Situs inversus: Q89.3

## 2015-10-10 LAB — CBC
HCT: 45.2 % (ref 40.0–52.0)
HEMOGLOBIN: 15.5 g/dL (ref 13.0–18.0)
MCH: 28.7 pg (ref 26.0–34.0)
MCHC: 34.2 g/dL (ref 32.0–36.0)
MCV: 84 fL (ref 80.0–100.0)
PLATELETS: 326 10*3/uL (ref 150–440)
RBC: 5.38 MIL/uL (ref 4.40–5.90)
RDW: 14 % (ref 11.5–14.5)
WBC: 9.6 10*3/uL (ref 3.8–10.6)

## 2015-10-10 LAB — PROTIME-INR
INR: 0.98
PROTHROMBIN TIME: 13.2 s (ref 11.4–15.0)

## 2015-10-10 LAB — APTT: aPTT: 27 seconds (ref 24–36)

## 2015-10-10 LAB — GLUCOSE, CAPILLARY: GLUCOSE-CAPILLARY: 80 mg/dL (ref 65–99)

## 2015-10-10 MED ORDER — MIDAZOLAM HCL 5 MG/5ML IJ SOLN
INTRAMUSCULAR | Status: AC
  Filled 2015-10-10: qty 10

## 2015-10-10 MED ORDER — HYDROCODONE-ACETAMINOPHEN 5-325 MG PO TABS
ORAL_TABLET | ORAL | Status: AC
  Filled 2015-10-10: qty 1

## 2015-10-10 MED ORDER — HYDROCODONE-ACETAMINOPHEN 5-325 MG PO TABS
1.0000 | ORAL_TABLET | ORAL | Status: DC | PRN
  Administered 2015-10-10: 1 via ORAL
  Filled 2015-10-10: qty 2

## 2015-10-10 MED ORDER — SODIUM CHLORIDE 0.9 % IV SOLN
INTRAVENOUS | Status: DC
  Administered 2015-10-10: 10 mL/h via INTRAVENOUS

## 2015-10-10 MED ORDER — MIDAZOLAM HCL 2 MG/2ML IJ SOLN
INTRAMUSCULAR | Status: AC | PRN
  Administered 2015-10-10: 1 mg via INTRAVENOUS

## 2015-10-10 MED ORDER — FENTANYL CITRATE (PF) 100 MCG/2ML IJ SOLN
INTRAMUSCULAR | Status: AC
  Filled 2015-10-10: qty 4

## 2015-10-10 MED ORDER — FENTANYL CITRATE (PF) 100 MCG/2ML IJ SOLN
INTRAMUSCULAR | Status: AC | PRN
  Administered 2015-10-10: 25 ug via INTRAVENOUS
  Administered 2015-10-10: 50 ug via INTRAVENOUS
  Administered 2015-10-10: 25 ug via INTRAVENOUS

## 2015-10-10 NOTE — Discharge Instructions (Signed)
Liver Biopsy °The liver is a large organ in the upper right-hand side of your abdomen. A liver biopsy is a procedure in which a tissue sample is taken from the liver and examined under a microscope. The procedure is done to confirm a suspected problem. °There are three types of liver biopsies: °· Percutaneous. In this type, an incision is made in your abdomen. The sample is removed through the incision with a needle. °· Laparoscopic. In this type, several incisions are made in the abdomen. A tiny camera is passed through one of the incisions to help guide the health care provider. The sample is removed through the other incision or incisions. °· Transjugular. In this type, an incision is made in the neck. A tube is passed through the incision to the liver. The sample is removed through the tube with a needle. °LET YOUR HEALTH CARE PROVIDER KNOW ABOUT: °· Any allergies you have. °· All medicines you are taking, including vitamins, herbs, eye drops, creams, and over-the-counter medicines. °· Previous problems you or members of your family have had with the use of anesthetics. °· Any blood disorders you have. °· Previous surgeries you have had. °· Medical conditions you have. °· Possibility of pregnancy, if this applies. °RISKS AND COMPLICATIONS °Generally, this is a safe procedure. However, problems can occur and include: °· Bleeding. °· Infection. °· Bruising. °· Collapsed lung. °· Leak of digestive juices (bile) from the liver or gallbladder. °· Problems with heart rhythm. °· Pain at the biopsy site or in the right shoulder. °· Low blood pressure (hypotension). °· Injury to nearby organs or tissues. °BEFORE THE PROCEDURE °· Your health care provider may do some blood or urine tests. These will help your health care provider learn how well your kidneys and liver are working and how well your blood clots. °· Ask your health care provider if you will be able to go home the day of the procedure. Arrange for someone to  take you home and stay with you for at least 24 hours. °· Do not eat or drink anything after midnight on the night before the procedure or as directed by your health care provider. °· Ask your health care provider about: °¨ Changing or stopping your regular medicines. This is especially important if you are taking diabetes medicines or blood thinners. °¨ Taking medicines such as aspirin and ibuprofen. These medicines can thin your blood. Do not take these medicines before your procedure if your health care provider asks you not to. °PROCEDURE °Regardless of the type of biopsy that will be done, you will have an IV line placed. Through this line, you will receive fluids and medicine to relax you. If you will be having a laparoscopic biopsy, you may also receive medicine through this line to make you sleep during the procedure (general anesthetic). °Percutaneous Liver Biopsy °· You will positioned on your back, with your right hand over your head. °· A health care provider will locate your liver by tapping and pressing on the right side of your abdomen or with the help of an ultrasound machine or CT scan. °· An area at the bottom of your last right rib will be numbed. °· An incision will be made in the numbed area. °· The biopsy needle will be inserted into the incision. °· Several samples of liver tissue will be taken with the biopsy needle. You will be asked to hold your breath as each sample is taken. °Laparoscopic Liver Biopsy °· You will be   positioned on your back. °· Several small incisions will be made in your abdomen. °· Your doctor will pass a tiny camera through one incision. The camera will allow the liver to be viewed on a TV monitor in the operating room. °· Tools will be passed through the other incision or incisions. These tools will be used to remove samples of liver tissue. °Transjugular Liver Biopsy °· You will be positioned on your back on an X-ray table, with your head turned to your left. °· An  area on your neck just over your jugular vein will be numbed. °· An incision will be made in the numbed area. °· A tiny tube will be inserted through the incision. It will be pushed through the jugular vein to a blood vessel in the liver called the hepatic vein. °· Dye will be inserted through the tube, and X-rays will be taken. The dye will make the blood vessels in the liver light up on the X-rays. °· The biopsy needle will be pushed through the tube until it reaches the liver. °· Samples of liver tissue will be taken with the biopsy needle. °· The needle and the tube will be removed. °After the samples are obtained, the incision or incisions will be closed. °AFTER THE PROCEDURE °· You will be taken to a recovery area. °· You may have to lie on your right side for 1-2 hours. This will prevent bleeding from the biopsy site. °· Your progress will be watched. Your blood pressure, pulse, and the biopsy site will be checked often. °· You may have some pain or feel sick. If this happens, tell your health care provider. °· As you begin to feel better, you will be offered ice and beverages. °· You may be allowed to go home when the medicines have worn off and you can walk, drink, eat, and use the bathroom. °  °This information is not intended to replace advice given to you by your health care provider. Make sure you discuss any questions you have with your health care provider. °  °Document Released: 03/01/2004 Document Revised: 12/31/2014 Document Reviewed: 02/05/2014 °Elsevier Interactive Patient Education ©2016 [employer] ° °

## 2015-10-10 NOTE — Procedures (Signed)
Procedure and risks discussed with patient. Informed consent obtained. Will perform US-guided liver biopsy. 

## 2015-10-10 NOTE — Procedures (Signed)
Under US guidance, core biopsy was performed of hepatic parenchyma. No immediate complications.

## 2015-10-11 LAB — SURGICAL PATHOLOGY

## 2015-11-22 ENCOUNTER — Encounter: Payer: Self-pay | Admitting: Gastroenterology

## 2015-11-22 ENCOUNTER — Ambulatory Visit: Payer: Self-pay | Admitting: Gastroenterology

## 2015-11-22 ENCOUNTER — Ambulatory Visit (INDEPENDENT_AMBULATORY_CARE_PROVIDER_SITE_OTHER): Payer: BLUE CROSS/BLUE SHIELD | Admitting: Gastroenterology

## 2015-11-22 VITALS — BP 130/79 | HR 82 | Temp 98.0°F | Ht 70.0 in | Wt 224.4 lb

## 2015-11-22 DIAGNOSIS — K7581 Nonalcoholic steatohepatitis (NASH): Secondary | ICD-10-CM

## 2015-11-22 NOTE — Progress Notes (Signed)
   Primary Care Physician: Brayton ElSyed Shah, MD  Primary Gastroenterologist:  Dr. Midge Miniumarren Letonia Stead  Chief Complaint  Patient presents with  . Follow up liver biopsy results    HPI: Andrew HarryJoseph A Fudala is a 39 y.o. male here for follow-up of his liver biopsy. The patient was found to have abnormal liver enzymes and suffers from diabetes and high cholesterol. The patient had a liver biopsy that showed a grade 1 stage 1 biopsy. The patient has been told that he has mild inflammation with mild fibrosis and should try to keep his diabetes under control, keep his weight under control and keep his cholesterol under control.  Current Outpatient Prescriptions  Medication Sig Dispense Refill  . amLODipine (NORVASC) 10 MG tablet Take 1 tablet (10 mg total) by mouth daily. 90 tablet 0  . aspirin 81 MG chewable tablet Chew 81 mg by mouth daily.    . metFORMIN (GLUCOPHAGE) 500 MG tablet Take 1 tablet (500 mg total) by mouth 2 (two) times daily with a meal. 180 tablet 0  . atorvastatin (LIPITOR) 20 MG tablet Take 1 tablet (20 mg total) by mouth daily at 6 PM. (Patient not taking: Reported on 09/26/2015) 90 tablet 0  . etodolac (LODINE) 400 MG tablet Take 1 tablet (400 mg total) by mouth 2 (two) times daily. (Patient not taking: Reported on 09/26/2015) 20 tablet 0   No current facility-administered medications for this visit.    Allergies as of 11/22/2015  . (No Known Allergies)    ROS:  General: Negative for anorexia, weight loss, fever, chills, fatigue, weakness. ENT: Negative for hoarseness, difficulty swallowing , nasal congestion. CV: Negative for chest pain, angina, palpitations, dyspnea on exertion, peripheral edema.  Respiratory: Negative for dyspnea at rest, dyspnea on exertion, cough, sputum, wheezing.  GI: See history of present illness. GU:  Negative for dysuria, hematuria, urinary incontinence, urinary frequency, nocturnal urination.  Endo: Negative for unusual weight change.    Physical  Examination:   BP 130/79 mmHg  Pulse 82  Temp(Src) 98 F (36.7 C) (Oral)  Ht 5\' 10"  (1.778 m)  Wt 224 lb 6.4 oz (101.787 kg)  BMI 32.20 kg/m2  General: Well-nourished, well-developed in no acute distress.  Eyes: No icterus. Conjunctivae pink. Mouth: Oropharyngeal mucosa moist and pink , no lesions erythema or exudate. Lungs: Clear to auscultation bilaterally. Non-labored. Neuro: Alert and oriented x 3.  Grossly intact. Skin: Warm and dry, no jaundice.   Psych: Alert and cooperative, normal mood and affect.  Labs:    Imaging Studies: No results found.  Assessment and Plan:   Andrew Sawyer is a 39 y.o. y/o male who comes in today after liver biopsy. The patient's liver biopsy showed steatohepatitis with minimal fibrosis and minimal inflammation. The patient has been given the results and has been told to keep his diabetes and hypercholesterolemia under control. The patient has also been told to try and lose weight or at least keep his weight down to what he is at now. The patient has been explained the plan and agrees with it.   Note: This dictation was prepared with Dragon dictation along with smaller phrase technology. Any transcriptional errors that result from this process are unintentional.

## 2015-12-05 ENCOUNTER — Other Ambulatory Visit: Payer: Self-pay | Admitting: Family Medicine

## 2016-01-02 ENCOUNTER — Ambulatory Visit: Payer: BLUE CROSS/BLUE SHIELD | Admitting: Family Medicine

## 2016-01-06 ENCOUNTER — Telehealth: Payer: Self-pay | Admitting: Family Medicine

## 2016-01-06 DIAGNOSIS — E785 Hyperlipidemia, unspecified: Secondary | ICD-10-CM

## 2016-01-06 MED ORDER — ATORVASTATIN CALCIUM 20 MG PO TABS: 20.0000 mg | ORAL_TABLET | Freq: Every day | ORAL | Status: DC

## 2016-01-06 NOTE — Telephone Encounter (Signed)
Medication has been refilled and sent to [facility] [address] 

## 2016-01-06 NOTE — Telephone Encounter (Signed)
Patient is requesting refill on Atorvastatin. [facility] Have enough for a couple of days.

## 2016-01-23 ENCOUNTER — Ambulatory Visit (INDEPENDENT_AMBULATORY_CARE_PROVIDER_SITE_OTHER): Payer: BLUE CROSS/BLUE SHIELD | Admitting: Family Medicine

## 2016-01-23 ENCOUNTER — Encounter: Payer: Self-pay | Admitting: Family Medicine

## 2016-01-23 VITALS — BP 127/79 | HR 77 | Temp 97.9°F | Resp 19 | Ht 70.0 in | Wt 225.3 lb

## 2016-01-23 DIAGNOSIS — E119 Type 2 diabetes mellitus without complications: Secondary | ICD-10-CM

## 2016-01-23 DIAGNOSIS — E1169 Type 2 diabetes mellitus with other specified complication: Secondary | ICD-10-CM

## 2016-01-23 DIAGNOSIS — R748 Abnormal levels of other serum enzymes: Secondary | ICD-10-CM

## 2016-01-23 DIAGNOSIS — I10 Essential (primary) hypertension: Secondary | ICD-10-CM | POA: Diagnosis not present

## 2016-01-23 DIAGNOSIS — L309 Dermatitis, unspecified: Secondary | ICD-10-CM | POA: Diagnosis not present

## 2016-01-23 DIAGNOSIS — E785 Hyperlipidemia, unspecified: Secondary | ICD-10-CM

## 2016-01-23 DIAGNOSIS — Z23 Encounter for immunization: Secondary | ICD-10-CM | POA: Diagnosis not present

## 2016-01-23 DIAGNOSIS — E669 Obesity, unspecified: Secondary | ICD-10-CM

## 2016-01-23 LAB — POCT GLYCOSYLATED HEMOGLOBIN (HGB A1C): HEMOGLOBIN A1C: 6.3

## 2016-01-23 LAB — GLUCOSE, POCT (MANUAL RESULT ENTRY): POC GLUCOSE: 156 mg/dL — AB (ref 70–99)

## 2016-01-23 MED ORDER — TRIAMCINOLONE ACETONIDE 0.5 % EX OINT: 1.0000 "application " | TOPICAL_OINTMENT | Freq: Two times a day (BID) | CUTANEOUS | Status: DC

## 2016-01-23 MED ORDER — METFORMIN HCL 850 MG PO TABS: 850.0000 mg | ORAL_TABLET | Freq: Two times a day (BID) | ORAL | Status: DC

## 2016-01-23 NOTE — Progress Notes (Signed)
Name: Andrew Sawyer   MRN: [id number]    DOB: Feb 09, 1976   Date:01/23/2016       Progress Note  Subjective  Chief Complaint  Chief Complaint  Patient presents with  . Follow-up    3 mo  . Diabetes  . Hypertension  . Hyperlipidemia    Diabetes He presents for his follow-up diabetic visit. He has type 2 diabetes mellitus. His disease course has been stable. Pertinent negatives for hypoglycemia include no headaches. Pertinent negatives for diabetes include no chest pain and no fatigue. Symptoms are stable. Current diabetic treatment includes oral agent (monotherapy).  Hypertension This is a chronic problem. The problem is unchanged. The problem is controlled. Pertinent negatives include no chest pain, headaches, palpitations or shortness of breath. Past treatments include calcium channel blockers.  Hyperlipidemia This is a chronic problem. The problem is controlled. Recent lipid tests were reviewed and are normal. Pertinent negatives include no chest pain, leg pain, myalgias or shortness of breath. Current antihyperlipidemic treatment includes statins (Restarted taking Lipitor after clearance from GI, has steatohepatitis, elevated liver enzymes.). The current treatment provides moderate improvement of lipids.  Rash This is a chronic problem. The current episode started more than 1 month ago (6 months ago). The problem has been waxing and waning since onset. The affected locations include the left lower leg and right lowerleg. The rash is characterized by itchiness, dryness and redness. Pertinent negatives include no fatigue, fever, joint pain or shortness of breath. Past treatments include moisturizer (Has used moisturizer cream which has not helped.).    Past Medical History  Diagnosis Date  . Diabetes mellitus without complication (HCC)   . Hyperlipidemia   . Hypertension   . Benign hypertension 03/30/2015  . Asthma, mild 03/30/2015  . Diabetes mellitus type 2 in obese (HCC) 03/30/2015  .  Situs inversus     Past Surgical History  Procedure Laterality Date  . Rhinoplasty    . Nasal sinus surgery      Family History  Problem Relation Age of Onset  . Diabetes Mother   . Hypertension Father   . Diabetes Brother   . Depression Paternal Grandmother   . Hypertension Paternal Grandfather   . Heart disease Paternal Grandfather   . Cancer Paternal Grandfather     Prostate    Social History   Social History  . Marital Status: Married    Spouse Name: N/A  . Number of Children: N/A  . Years of Education: N/A   Occupational History  . Not on file.   Social History Main Topics  . Smoking status: Never Smoker   . Smokeless tobacco: Never Used  . Alcohol Use: No  . Drug Use: No  . Sexual Activity: Not on file   Other Topics Concern  . Not on file   Social History Narrative     Current outpatient prescriptions:  .  amLODipine (NORVASC) 10 MG tablet, TAKE ONE TABLET BY MOUTH ONCE DAILY, Disp: 90 tablet, Rfl: 0 .  aspirin 81 MG chewable tablet, Chew 81 mg by mouth daily., Disp: , Rfl:  .  atorvastatin (LIPITOR) 20 MG tablet, Take 1 tablet (20 mg total) by mouth daily at 6 PM., Disp: 30 tablet, Rfl: 0 .  etodolac (LODINE) 400 MG tablet, Take 1 tablet (400 mg total) by mouth 2 (two) times daily., Disp: 20 tablet, Rfl: 0 .  metFORMIN (GLUCOPHAGE) 500 MG tablet, TAKE ONE TABLET BY MOUTH TWICE DAILY WITH MEALS, Disp: 180 tablet, Rfl: 0  No Known Allergies   Review of Systems  Constitutional: Negative for fever and fatigue.  Respiratory: Negative for shortness of breath.   Cardiovascular: Negative for chest pain and palpitations.  Musculoskeletal: Negative for myalgias and joint pain.  Skin: Positive for rash.  Neurological: Negative for headaches.    Objective  Filed Vitals:   01/23/16 1132  BP: 127/79  Pulse: 77  Temp: 97.9 F (36.6 C)  TempSrc: Oral  Resp: 19  Height:  (1.778 m)  Weight: 225 lb 4.8 oz (102.195 kg)  SpO2: 97%    Physical  Exam  Constitutional: He is oriented to person, place, and time and well-developed, well-nourished, and in no distress.  Cardiovascular: Normal rate, regular rhythm and normal heart sounds.   Pulmonary/Chest: Effort normal and breath sounds normal.  Abdominal: Soft. Bowel sounds are normal. There is no tenderness.  Neurological: He is alert and oriented to person, place, and time.  Skin:     maculo-papular erythematous pruritic rash on posterior lower extremities behind the knees.  Psychiatric: Mood, memory, affect and judgment normal.  Nursing note and vitals reviewed.   Assessment & Plan  1. Need for immunization against influenza  - Flu Vaccine QUAD 36+ mos PF IM (Fluarix & Fluzone Quad PF)  2. Diabetes mellitus type 2 in obese (HCC) A1c increased from 5.9% to 6.3%over last 3 months. Increase Metformin from 500 twice a day to 850 twice a day.  - metFORMIN (GLUCOPHAGE) 850 MG tablet; Take 1 tablet (850 mg total) by mouth 2 (two) times daily with a meal.  Dispense: 60 tablet; Refill: 2  3. Benign hypertension BP stable and controlled on pharmacotherapy.  4. Elevated liver enzymes Seen by GI, diagnosed with steatohepatitis. Recheck enzymes today. - Comprehensive Metabolic Panel (CMET)  5. Dermatitis of lower extremity  - triamcinolone ointment (KENALOG) 0.5 %; Apply 1 application topically 2 (two) times daily.  Dispense: 30 g; Refill: 0  6. Dyslipidemia Has been restarted on Lipitor after approval from gastroenterologist. Recheck lipids today. - Lipid Profile   Andrew Sawyer A. [facility] 01/23/2016 11:42 AM

## 2016-02-01 LAB — COMPREHENSIVE METABOLIC PANEL
A/G RATIO: 2.1 (ref 1.1–2.5)
ALK PHOS: 76 IU/L (ref 39–117)
ALT: 42 IU/L (ref 0–44)
AST: 22 IU/L (ref 0–40)
Albumin: 4.8 g/dL (ref 3.5–5.5)
BUN / CREAT RATIO: 18 (ref 9–20)
BUN: 17 mg/dL (ref 6–24)
Bilirubin Total: 0.4 mg/dL (ref 0.0–1.2)
CO2: 25 mmol/L (ref 18–29)
CREATININE: 0.97 mg/dL (ref 0.76–1.27)
Calcium: 9.3 mg/dL (ref 8.7–10.2)
Chloride: 98 mmol/L (ref 96–106)
GFR calc Af Amer: 112 mL/min/{1.73_m2} (ref >59)
GFR calc non Af Amer: 97 mL/min/{1.73_m2} (ref >59)
GLOBULIN, TOTAL: 2.3 g/dL (ref 1.5–4.5)
Glucose: 116 mg/dL — ABNORMAL HIGH (ref 65–99)
Potassium: 4.2 mmol/L (ref 3.5–5.2)
SODIUM: 141 mmol/L (ref 134–144)
Total Protein: 7.1 g/dL (ref 6.0–8.5)

## 2016-02-01 LAB — LIPID PANEL
CHOL/HDL RATIO: 4.5 ratio (ref 0.0–5.0)
CHOLESTEROL TOTAL: 153 mg/dL (ref 100–199)
HDL: 34 mg/dL — ABNORMAL LOW (ref >39)
LDL CALC: 82 mg/dL (ref 0–99)
Triglycerides: 187 mg/dL — ABNORMAL HIGH (ref 0–149)
VLDL CHOLESTEROL CAL: 37 mg/dL (ref 5–40)

## 2016-02-07 ENCOUNTER — Other Ambulatory Visit: Payer: Self-pay | Admitting: Family Medicine

## 2016-03-08 ENCOUNTER — Other Ambulatory Visit: Payer: Self-pay | Admitting: Family Medicine

## 2016-03-11 ENCOUNTER — Other Ambulatory Visit: Payer: Self-pay | Admitting: Family Medicine

## 2016-04-12 ENCOUNTER — Other Ambulatory Visit: Payer: Self-pay | Admitting: Family Medicine

## 2016-04-23 ENCOUNTER — Telehealth: Payer: Self-pay

## 2016-04-23 ENCOUNTER — Encounter: Payer: Self-pay | Admitting: Family Medicine

## 2016-04-23 ENCOUNTER — Ambulatory Visit (INDEPENDENT_AMBULATORY_CARE_PROVIDER_SITE_OTHER): Payer: BLUE CROSS/BLUE SHIELD | Admitting: Family Medicine

## 2016-04-23 VITALS — BP 122/73 | HR 69 | Temp 97.4°F | Resp 15 | Ht 70.0 in | Wt 224.8 lb

## 2016-04-23 DIAGNOSIS — E1169 Type 2 diabetes mellitus with other specified complication: Secondary | ICD-10-CM

## 2016-04-23 DIAGNOSIS — E785 Hyperlipidemia, unspecified: Secondary | ICD-10-CM

## 2016-04-23 DIAGNOSIS — E669 Obesity, unspecified: Secondary | ICD-10-CM | POA: Diagnosis not present

## 2016-04-23 DIAGNOSIS — E119 Type 2 diabetes mellitus without complications: Secondary | ICD-10-CM

## 2016-04-23 LAB — GLUCOSE, POCT (MANUAL RESULT ENTRY): POC Glucose: 113 mg/dl — AB (ref 70–99)

## 2016-04-23 LAB — POCT GLYCOSYLATED HEMOGLOBIN (HGB A1C): HEMOGLOBIN A1C: 6

## 2016-04-23 MED ORDER — METFORMIN HCL 850 MG PO TABS: 850.0000 mg | ORAL_TABLET | Freq: Two times a day (BID) | ORAL | Status: DC

## 2016-04-23 NOTE — Telephone Encounter (Signed)
Medication has been refilled and patient has prescription

## 2016-04-23 NOTE — Progress Notes (Signed)
Name: Andrew Sawyer   MRN: [id number]    DOB: December 28, 1975   Date:04/23/2016       Progress Note  Subjective  Chief Complaint  Chief Complaint  Patient presents with  . Follow-up    3 mo  . Diabetes    Diabetes He presents for his follow-up diabetic visit. He has type 2 diabetes mellitus. His disease course has been stable. Associated symptoms include polydipsia and polyuria. Pertinent negatives for diabetes include no chest pain, no fatigue and no foot paresthesias. Current diabetic treatment includes oral agent (monotherapy). He is following a diabetic diet. His breakfast blood glucose range is generally 110-130 mg/dl. An ACE inhibitor/angiotensin II receptor blocker is not being taken.  Hyperlipidemia This is a chronic problem. The problem is uncontrolled. Recent lipid tests were reviewed and are high. Pertinent negatives include no chest pain, leg pain or myalgias. Current antihyperlipidemic treatment includes statins.    Past Medical History  Diagnosis Date  . Diabetes mellitus without complication (HCC)   . Hyperlipidemia   . Hypertension   . Benign hypertension 03/30/2015  . Asthma, mild 03/30/2015  . Diabetes mellitus type 2 in obese (HCC) 03/30/2015  . Situs inversus     Past Surgical History  Procedure Laterality Date  . Rhinoplasty    . Nasal sinus surgery      Family History  Problem Relation Age of Onset  . Diabetes Mother   . Hypertension Father   . Diabetes Brother   . Depression Paternal Grandmother   . Hypertension Paternal Grandfather   . Heart disease Paternal Grandfather   . Cancer Paternal Grandfather     Prostate    Social History   Social History  . Marital Status: Married    Spouse Name: N/A  . Number of Children: N/A  . Years of Education: N/A   Occupational History  . Not on file.   Social History Main Topics  . Smoking status: Never Smoker   . Smokeless tobacco: Never Used  . Alcohol Use: No  . Drug Use: No  . Sexual Activity: Not on  file   Other Topics Concern  . Not on file   Social History Narrative     Current outpatient prescriptions:  .  amLODipine (NORVASC) 10 MG tablet, TAKE ONE TABLET BY MOUTH ONCE DAILY, Disp: 90 tablet, Rfl: 0 .  aspirin 81 MG chewable tablet, Chew 81 mg by mouth daily., Disp: , Rfl:  .  atorvastatin (LIPITOR) 20 MG tablet, TAKE ONE TABLET BY MOUTH ONCE DAILY AT  6  PM, Disp: 30 tablet, Rfl: 0 .  triamcinolone ointment (KENALOG) 0.5 %, Apply 1 application topically 2 (two) times daily., Disp: 30 g, Rfl: 0 .  etodolac (LODINE) 400 MG tablet, Take 1 tablet (400 mg total) by mouth 2 (two) times daily. (Patient not taking: Reported on 04/23/2016), Disp: 20 tablet, Rfl: 0 .  metFORMIN (GLUCOPHAGE) 850 MG tablet, Take 1 tablet (850 mg total) by mouth 2 (two) times daily with a meal., Disp: 60 tablet, Rfl: 2  No Known Allergies   Review of Systems  Constitutional: Negative for fever, chills and fatigue.  Cardiovascular: Negative for chest pain.  Gastrointestinal: Negative for abdominal pain.  Musculoskeletal: Negative for myalgias, back pain and joint pain.  Endo/Heme/Allergies: Positive for polydipsia.    Objective  Filed Vitals:   04/23/16 0922  BP: 122/73  Pulse: 69  Temp: 97.4 F (36.3 C)  TempSrc: Oral  Resp: 15  Height:  (1.778 m)  Weight: 224 lb 12.8 oz (101.969 kg)  SpO2: 99%    Physical Exam  Constitutional: He is oriented to person, place, and time and well-developed, well-nourished, and in no distress.  HENT:  Head: Normocephalic and atraumatic.  Cardiovascular: Normal rate and regular rhythm.   Pulmonary/Chest: Effort normal and breath sounds normal.  Abdominal: Soft. Bowel sounds are normal.  Musculoskeletal: He exhibits no edema.  Neurological: He is alert and oriented to person, place, and time.  Psychiatric: Mood, memory, affect and judgment normal.  Nursing note and vitals reviewed.    Assessment & Plan  1. Diabetes mellitus type 2 in obese  (HCC) A1c is at goal, continue present therapy. - POCT HgB A1C - POCT Glucose (CBG) - POCT UA - Microalbumin  2. Dyslipidemia Continue on statin for primary prevention, obtain FLP. - Lipid Profile   Kelise Kuch Asad A. Faylene KurtzShah [facility] 04/23/2016 9:34 AM

## 2016-04-24 LAB — LIPID PANEL
CHOL/HDL RATIO: 4.2 ratio (ref 0.0–5.0)
Cholesterol, Total: 186 mg/dL (ref 100–199)
HDL: 44 mg/dL (ref >39)
LDL CALC: 105 mg/dL — AB (ref 0–99)
TRIGLYCERIDES: 185 mg/dL — AB (ref 0–149)
VLDL Cholesterol Cal: 37 mg/dL (ref 5–40)

## 2016-05-04 ENCOUNTER — Telehealth: Payer: Self-pay

## 2016-05-04 MED ORDER — ATORVASTATIN CALCIUM 40 MG PO TABS: 40.0000 mg | ORAL_TABLET | Freq: Every day | ORAL | Status: DC

## 2016-05-04 NOTE — Telephone Encounter (Signed)
Lab results have been reported to patient and a prescription for At[facility]vastatin 40 mg at [facility] [address] [facility] Dr. Sherryll BurgerShah, patient has been notified and verbalized understanding

## 2016-06-07 ENCOUNTER — Other Ambulatory Visit: Payer: Self-pay | Admitting: Family Medicine

## 2016-07-17 ENCOUNTER — Other Ambulatory Visit: Payer: Self-pay | Admitting: Family Medicine

## 2016-07-17 DIAGNOSIS — E669 Obesity, unspecified: Principal | ICD-10-CM

## 2016-07-17 DIAGNOSIS — E1169 Type 2 diabetes mellitus with other specified complication: Secondary | ICD-10-CM

## 2016-07-30 ENCOUNTER — Encounter: Payer: Self-pay | Admitting: Family Medicine

## 2016-07-30 ENCOUNTER — Ambulatory Visit (INDEPENDENT_AMBULATORY_CARE_PROVIDER_SITE_OTHER): Payer: BLUE CROSS/BLUE SHIELD | Admitting: Family Medicine

## 2016-07-30 VITALS — BP 120/71 | HR 73 | Temp 98.3°F | Resp 16 | Ht 70.0 in | Wt 223.7 lb

## 2016-07-30 DIAGNOSIS — I10 Essential (primary) hypertension: Secondary | ICD-10-CM | POA: Diagnosis not present

## 2016-07-30 DIAGNOSIS — E119 Type 2 diabetes mellitus without complications: Secondary | ICD-10-CM | POA: Diagnosis not present

## 2016-07-30 DIAGNOSIS — E669 Obesity, unspecified: Secondary | ICD-10-CM

## 2016-07-30 DIAGNOSIS — E785 Hyperlipidemia, unspecified: Secondary | ICD-10-CM | POA: Diagnosis not present

## 2016-07-30 DIAGNOSIS — E1169 Type 2 diabetes mellitus with other specified complication: Secondary | ICD-10-CM

## 2016-07-30 LAB — POCT GLYCOSYLATED HEMOGLOBIN (HGB A1C): HEMOGLOBIN A1C: 6.6

## 2016-07-30 LAB — GLUCOSE, POCT (MANUAL RESULT ENTRY): POC GLUCOSE: 139 mg/dL — AB (ref 70–99)

## 2016-07-30 MED ORDER — ATORVASTATIN CALCIUM 40 MG PO TABS: 40.0000 mg | ORAL_TABLET | Freq: Every day | ORAL | 0 refills | Status: DC

## 2016-07-30 NOTE — Progress Notes (Signed)
Name: Andrew Sawyer   MRN: [id number]    DOB: May 29, 1976   Date:07/30/2016       Progress Note  Subjective  Chief Complaint  Chief Complaint  Patient presents with  . Follow-up    3 mo  . Medication Refill    Diabetes  He presents for his follow-up diabetic visit. He has type 2 diabetes mellitus. His disease course has been stable. Pertinent negatives for hypoglycemia include no headaches. Pertinent negatives for diabetes include no blurred vision, no chest pain, no foot paresthesias, no polydipsia and no polyuria. Current diabetic treatment includes oral agent (monotherapy). His breakfast blood glucose range is generally 110-130 mg/dl. An ACE inhibitor/angiotensin II receptor blocker is not being taken.     Past Medical History:  Diagnosis Date  . Asthma, mild 03/30/2015  . Benign hypertension 03/30/2015  . Diabetes mellitus type 2 in obese (HCC) 03/30/2015  . Diabetes mellitus without complication (HCC)   . Hyperlipidemia   . Hypertension   . Situs inversus     Past Surgical History:  Procedure Laterality Date  . NASAL SINUS SURGERY    . RHINOPLASTY      Family History  Problem Relation Age of Onset  . Diabetes Mother   . Hypertension Father   . Diabetes Brother   . Depression Paternal Grandmother   . Hypertension Paternal Grandfather   . Heart disease Paternal Grandfather   . Cancer Paternal Grandfather     Prostate    Social History   Social History  . Marital status: Married    Spouse name: N/A  . Number of children: N/A  . Years of education: N/A   Occupational History  . Not on file.   Social History Main Topics  . Smoking status: Never Smoker  . Smokeless tobacco: Never Used  . Alcohol use No  . Drug use: No  . Sexual activity: Not on file   Other Topics Concern  . Not on file   Social History Narrative  . No narrative on file     Current Outpatient Prescriptions:  .  amLODipine (NORVASC) 10 MG tablet, TAKE ONE TABLET BY MOUTH ONCE DAILY,  Disp: 90 tablet, Rfl: 0 .  aspirin 81 MG chewable tablet, Chew 81 mg by mouth daily., Disp: , Rfl:  .  atorvastatin (LIPITOR) 40 MG tablet, Take 1 tablet (40 mg total) by mouth at bedtime., Disp: 90 tablet, Rfl: 0 .  metFORMIN (GLUCOPHAGE) 850 MG tablet, TAKE ONE TABLET BY MOUTH TWICE DAILY WITH MEALS, Disp: 60 tablet, Rfl: 0 .  triamcinolone ointment (KENALOG) 0.5 %, Apply 1 application topically 2 (two) times daily., Disp: 30 g, Rfl: 0  No Known Allergies   Review of Systems  Eyes: Negative for blurred vision.  Cardiovascular: Negative for chest pain and leg swelling.  Neurological: Negative for headaches.  Endo/Heme/Allergies: Negative for polydipsia.    Objective  Vitals:   07/30/16 0842  BP: 120/71  Pulse: 73  Resp: 16  Temp: 98.3 F (36.8 C)  TempSrc: Oral  SpO2: 96%  Weight: 223 lb 11.2 oz (101.5 kg)  Height:  (1.778 m)    Physical Exam  Constitutional: He is oriented to person, place, and time and well-developed, well-nourished, and in no distress.  HENT:  Head: Normocephalic and atraumatic.  Cardiovascular: Normal rate, regular rhythm and normal heart sounds.   No murmur heard. Pulmonary/Chest: Effort normal and breath sounds normal. He has no wheezes.  Musculoskeletal: He exhibits no edema.  Neurological: He  is alert and oriented to person, place, and time.  Psychiatric: Mood, memory, affect and judgment normal.  Nursing note and vitals reviewed.     Assessment & Plan  1. Dyslipidemia Elevated triglycerides and below normal HDL on lab work from February 2017. Recheck today. - Comprehensive Metabolic Panel (CMET) - Lipid Profile - atorvastatin (LIPITOR) 40 MG tablet; Take 1 tablet (40 mg total) by mouth at bedtime.  Dispense: 90 tablet; Refill: 0  2. Diabetes mellitus type 2 in obese (HCC) Hemoglobin A1c 6.6%, at goal. - POCT Glucose (CBG) - POCT HgB A1C - Urine Microalbumin w/creat. ratio  3. Benign hypertension BP stable and controlled on  anti-hypertensive therapy.   Andrew Sawyer A. Andrew Sawyer [facility] 07/30/2016 8:59 AM

## 2016-07-31 LAB — COMPREHENSIVE METABOLIC PANEL
ALBUMIN: 4.9 g/dL (ref 3.6–5.1)
ALT: 49 U/L — ABNORMAL HIGH (ref 9–46)
AST: 29 U/L (ref 10–40)
Alkaline Phosphatase: 67 U/L (ref 40–115)
BUN: 16 mg/dL (ref 7–25)
CALCIUM: 9.8 mg/dL (ref 8.6–10.3)
CHLORIDE: 102 mmol/L (ref 98–110)
CO2: 27 mmol/L (ref 20–31)
Creat: 0.98 mg/dL (ref 0.60–1.35)
GLUCOSE: 121 mg/dL — AB (ref 65–99)
Potassium: 4.3 mmol/L (ref 3.5–5.3)
SODIUM: 140 mmol/L (ref 135–146)
Total Bilirubin: 0.8 mg/dL (ref 0.2–1.2)
Total Protein: 7.1 g/dL (ref 6.1–8.1)

## 2016-07-31 LAB — LIPID PANEL
CHOL/HDL RATIO: 3.9 ratio (ref <=5.0)
CHOLESTEROL: 162 mg/dL (ref 125–200)
HDL: 42 mg/dL (ref >=40)
LDL Cholesterol: 77 mg/dL (ref <130)
TRIGLYCERIDES: 215 mg/dL — AB (ref <150)
VLDL: 43 mg/dL — ABNORMAL HIGH (ref <30)

## 2016-07-31 LAB — MICROALBUMIN / CREATININE URINE RATIO
Creatinine, Urine: 273 mg/dL (ref 20–370)
MICROALB UR: 3 mg/dL
Microalb Creat Ratio: 11 mcg/mg creat (ref <30)

## 2016-08-13 ENCOUNTER — Ambulatory Visit (INDEPENDENT_AMBULATORY_CARE_PROVIDER_SITE_OTHER): Payer: BLUE CROSS/BLUE SHIELD | Admitting: Family Medicine

## 2016-08-13 ENCOUNTER — Encounter: Payer: Self-pay | Admitting: Family Medicine

## 2016-08-13 DIAGNOSIS — E669 Obesity, unspecified: Secondary | ICD-10-CM | POA: Diagnosis not present

## 2016-08-13 DIAGNOSIS — E119 Type 2 diabetes mellitus without complications: Secondary | ICD-10-CM | POA: Diagnosis not present

## 2016-08-13 DIAGNOSIS — E1169 Type 2 diabetes mellitus with other specified complication: Secondary | ICD-10-CM

## 2016-08-13 DIAGNOSIS — E781 Pure hyperglyceridemia: Secondary | ICD-10-CM | POA: Diagnosis not present

## 2016-08-13 DIAGNOSIS — E785 Hyperlipidemia, unspecified: Secondary | ICD-10-CM

## 2016-08-13 LAB — TSH: TSH: 1.6 m[IU]/L (ref 0.40–4.50)

## 2016-08-13 MED ORDER — METFORMIN HCL 850 MG PO TABS: 850.0000 mg | ORAL_TABLET | Freq: Two times a day (BID) | ORAL | 0 refills | Status: DC

## 2016-08-13 MED ORDER — ICOSAPENT ETHYL 1 G PO CAPS: 2.0000 | ORAL_CAPSULE | Freq: Two times a day (BID) | ORAL | 0 refills | Status: DC

## 2016-08-13 NOTE — Progress Notes (Signed)
Name: Andrew Sawyer   MRN: [id number]    DOB: 13-Jul-1976   Date:08/13/2016       Progress Note  Subjective  Chief Complaint  Chief Complaint  Patient presents with  . Hyperlipidemia    elevated labs    HPI  Hypertriglyceridemia: Pt. Presents for elevated triglycerides levels, now over 200mg /dL, steadily increasing over the last 9 months. Patient has Diabetes Mellitus and is considered obese based on BMI which are two well-known risk factors for elevated triglycerides.    Past Medical History:  Diagnosis Date  . Asthma, mild 03/30/2015  . Benign hypertension 03/30/2015  . Diabetes mellitus type 2 in obese (HCC) 03/30/2015  . Diabetes mellitus without complication (HCC)   . Hyperlipidemia   . Hypertension   . Situs inversus     Past Surgical History:  Procedure Laterality Date  . NASAL SINUS SURGERY    . RHINOPLASTY      Family History  Problem Relation Age of Onset  . Diabetes Mother   . Hypertension Father   . Diabetes Brother   . Depression Paternal Grandmother   . Hypertension Paternal Grandfather   . Heart disease Paternal Grandfather   . Cancer Paternal Grandfather     Prostate    Social History   Social History  . Marital status: Married    Spouse name: N/A  . Number of children: N/A  . Years of education: N/A   Occupational History  . Not on file.   Social History Main Topics  . Smoking status: Never Smoker  . Smokeless tobacco: Never Used  . Alcohol use No  . Drug use: No  . Sexual activity: Not on file   Other Topics Concern  . Not on file   Social History Narrative  . No narrative on file     Current Outpatient Prescriptions:  .  amLODipine (NORVASC) 10 MG tablet, TAKE ONE TABLET BY MOUTH ONCE DAILY, Disp: 90 tablet, Rfl: 0 .  aspirin 81 MG chewable tablet, Chew 81 mg by mouth daily., Disp: , Rfl:  .  atorvastatin (LIPITOR) 40 MG tablet, Take 1 tablet (40 mg total) by mouth at bedtime., Disp: 90 tablet, Rfl: 0 .  metFORMIN (GLUCOPHAGE)  850 MG tablet, TAKE ONE TABLET BY MOUTH TWICE DAILY WITH MEALS, Disp: 60 tablet, Rfl: 0 .  triamcinolone ointment (KENALOG) 0.5 %, Apply 1 application topically 2 (two) times daily. (Patient not taking: Reported on 08/13/2016), Disp: 30 g, Rfl: 0  No Known Allergies   Review of Systems  Constitutional: Negative for chills and fever.  Respiratory: Negative for shortness of breath.   Cardiovascular: Negative for chest pain.  Gastrointestinal: Negative for abdominal pain, nausea and vomiting.      Objective  Vitals:   08/13/16 1053  BP: 138/70  Pulse: 80  Resp: 18  Temp: 98 F (36.7 C)  TempSrc: Oral  SpO2: 96%  Weight: 226 lb 9.6 oz (102.8 kg)  Height: 5\' 10"  (1.778 m)    Physical Exam  Constitutional: He is oriented to person, place, and time and well-developed, well-nourished, and in no distress.  Cardiovascular: Normal rate, regular rhythm and normal heart sounds.   No murmur heard. Pulmonary/Chest: Effort normal and breath sounds normal. He has no wheezes.  Abdominal: Soft. Bowel sounds are normal. There is no tenderness.  Neurological: He is alert and oriented to person, place, and time.  Psychiatric: Mood, memory, affect and judgment normal.  Nursing note and vitals reviewed.  Assessment & Plan  1. Hypertriglyceridemia Obtaining workup for elevated triglycerides, start on Vascepa.  - TSH - Icosapent Ethyl (VASCEPA) 1 g CAPS; Take 2 capsules by mouth 2 (two) times daily after a meal.  Dispense: 360 capsule; Refill: 0  2. Diabetes mellitus type 2 in obese (HCC) Refills for metformin provided - metFORMIN (GLUCOPHAGE) 850 MG tablet; Take 1 tablet (850 mg total) by mouth 2 (two) times daily with a meal.  Dispense: 180 tablet; Refill: 0   Paraskevi Funez Asad A. Faylene KurtzShah [facility] 08/13/2016 11:27 AM

## 2016-08-14 ENCOUNTER — Telehealth: Payer: Self-pay | Admitting: Family Medicine

## 2016-08-14 NOTE — Telephone Encounter (Signed)
He should find out from his pharmacy and insurance plan as to which alternative medications to lower triglycerides are covered

## 2016-08-15 NOTE — Telephone Encounter (Signed)
Told him that and he is to call us back once he finds that information out

## 2016-08-17 NOTE — Telephone Encounter (Signed)
Gemfibrozil cannot be added on because he is already on high-dose statin therapy. If his insurance company does not approve Vascepa, he can continue with Atorvastatin, increase physical activity, avoid fatty foods and recheck in 3 months.

## 2016-08-20 NOTE — Telephone Encounter (Signed)
Pt informed and understands directions

## 2016-09-09 ENCOUNTER — Other Ambulatory Visit: Payer: Self-pay | Admitting: Family Medicine

## 2016-09-23 ENCOUNTER — Emergency Department: Payer: BLUE CROSS/BLUE SHIELD

## 2016-09-23 ENCOUNTER — Emergency Department
Admission: EM | Admit: 2016-09-23 | Discharge: 2016-09-23 | Disposition: A | Discharge status: Home / Self Care | Payer: BLUE CROSS/BLUE SHIELD | Attending: Emergency Medicine | Admitting: Emergency Medicine

## 2016-09-23 ENCOUNTER — Encounter: Payer: Self-pay | Admitting: Emergency Medicine

## 2016-09-23 DIAGNOSIS — Z7982 Long term (current) use of aspirin: Secondary | ICD-10-CM | POA: Diagnosis not present

## 2016-09-23 DIAGNOSIS — R05 Cough: Secondary | ICD-10-CM | POA: Diagnosis not present

## 2016-09-23 DIAGNOSIS — I10 Essential (primary) hypertension: Secondary | ICD-10-CM | POA: Insufficient documentation

## 2016-09-23 DIAGNOSIS — E119 Type 2 diabetes mellitus without complications: Secondary | ICD-10-CM | POA: Insufficient documentation

## 2016-09-23 DIAGNOSIS — R042 Hemoptysis: Secondary | ICD-10-CM

## 2016-09-23 DIAGNOSIS — Z7984 Long term (current) use of oral hypoglycemic drugs: Secondary | ICD-10-CM | POA: Diagnosis not present

## 2016-09-23 DIAGNOSIS — J45909 Unspecified asthma, uncomplicated: Secondary | ICD-10-CM | POA: Insufficient documentation

## 2016-09-23 DIAGNOSIS — Z79899 Other long term (current) drug therapy: Secondary | ICD-10-CM | POA: Diagnosis not present

## 2016-09-23 LAB — COMPREHENSIVE METABOLIC PANEL
ALT: 62 U/L (ref 17–63)
AST: 32 U/L (ref 15–41)
Albumin: 4.6 g/dL (ref 3.5–5.0)
Alkaline Phosphatase: 66 U/L (ref 38–126)
Anion gap: 8 (ref 5–15)
BUN: 12 mg/dL (ref 6–20)
CALCIUM: 9.3 mg/dL (ref 8.9–10.3)
CHLORIDE: 106 mmol/L (ref 101–111)
CO2: 25 mmol/L (ref 22–32)
CREATININE: 0.94 mg/dL (ref 0.61–1.24)
Glucose, Bld: 128 mg/dL — ABNORMAL HIGH (ref 65–99)
Potassium: 3.9 mmol/L (ref 3.5–5.1)
Sodium: 139 mmol/L (ref 135–145)
Total Bilirubin: 0.6 mg/dL (ref 0.3–1.2)
Total Protein: 7.9 g/dL (ref 6.5–8.1)

## 2016-09-23 LAB — CBC WITH DIFFERENTIAL/PLATELET
Basophils Absolute: 0.1 10*3/uL (ref 0–0.1)
Basophils Relative: 1 %
EOS PCT: 4 %
Eosinophils Absolute: 0.4 10*3/uL (ref 0–0.7)
HCT: 46.4 % (ref 40.0–52.0)
Hemoglobin: 15.5 g/dL (ref 13.0–18.0)
LYMPHS ABS: 2.5 10*3/uL (ref 1.0–3.6)
LYMPHS PCT: 25 %
MCH: 28.2 pg (ref 26.0–34.0)
MCHC: 33.5 g/dL (ref 32.0–36.0)
MCV: 84.2 fL (ref 80.0–100.0)
MONO ABS: 0.8 10*3/uL (ref 0.2–1.0)
MONOS PCT: 9 %
Neutro Abs: 6.2 10*3/uL (ref 1.4–6.5)
Neutrophils Relative %: 61 %
PLATELETS: 305 10*3/uL (ref 150–440)
RBC: 5.52 MIL/uL (ref 4.40–5.90)
RDW: 13.8 % (ref 11.5–14.5)
WBC: 10 10*3/uL (ref 3.8–10.6)

## 2016-09-23 MED ORDER — IOPAMIDOL (ISOVUE-370) INJECTION 76%
75.0000 mL | Freq: Once | INTRAVENOUS | Status: AC | PRN
  Administered 2016-09-23: 75 mL via INTRAVENOUS
  Filled 2016-09-23: qty 75

## 2016-09-23 NOTE — ED Notes (Signed)
Sputum sample sent to lab

## 2016-09-23 NOTE — ED Triage Notes (Signed)
States coughed up blood this am.

## 2016-09-23 NOTE — ED Triage Notes (Signed)
Pt coughed and spit up mucous that was noted to be bloody

## 2016-09-23 NOTE — ED Provider Notes (Signed)
[facility] Emergency Department Provider Note   ____________________________________________    I have reviewed the triage vital signs and the nursing notes.   HISTORY  Chief Complaint Hemoptysis    HPI Andrew Sawyer is a 40 y.o. male who presents with complaints of hemoptysis. Patient reports he developed a cough this morning and initially it was mucus and then turned to primarily blood. Otherwise he feels well. He denies pleuritic pain. No fevers or chills. No history of DVT or PE. Patient is a Marine scientistlong-haul truck driver. He denies shortness of breath. No calf pain or swelling. History of situs inversus   Past Medical History:  Diagnosis Date  . Asthma, mild 03/30/2015  . Benign hypertension 03/30/2015  . Diabetes mellitus type 2 in obese (HCC) 03/30/2015  . Diabetes mellitus without complication (HCC)   . Hyperlipidemia   . Hypertension   . Situs inversus     Patient Active Problem List   Diagnosis Date Noted  . Hypertriglyceridemia 08/13/2016  . Dermatitis of lower extremity 01/23/2016  . Elevated liver enzymes 07/25/2015  . Asthma, mild 03/30/2015  . Diabetes mellitus type 2 in obese (HCC) 03/30/2015  . Routine general medical examination at a health care facility 03/30/2015  . Difficulty hearing 03/30/2015  . Brash 03/30/2015  . Dyslipidemia 03/30/2015  . Blood glucose elevated 03/30/2015  . Benign hypertension 03/30/2015  . Flu vaccine need 03/30/2015  . Adiposity 03/30/2015  . Pneumococcal vaccination given 03/30/2015  . Abnormal presence of protein in urine 03/30/2015    Past Surgical History:  Procedure Laterality Date  . NASAL SINUS SURGERY    . RHINOPLASTY      Prior to Admission medications   Medication Sig Start Date End Date Taking? Authorizing Provider  amLODipine (NORVASC) 10 MG tablet TAKE ONE TABLET BY MOUTH ONCE DAILY 09/10/16   Ellyn HackSyed Asad A Shah, MD  aspirin 81 MG chewable tablet Chew 81 mg by mouth daily.     Historical Provider, MD  atorvastatin (LIPITOR) 40 MG tablet Take 1 tablet (40 mg total) by mouth at bedtime. 07/30/16   Ellyn HackSyed Asad A Shah, MD  Icosapent Ethyl (VASCEPA) 1 g CAPS Take 2 capsules by mouth 2 (two) times daily after a meal. 08/13/16   Ellyn HackSyed Asad A Shah, MD  metFORMIN (GLUCOPHAGE) 850 MG tablet Take 1 tablet (850 mg total) by mouth 2 (two) times daily with a meal. 08/13/16   Ellyn HackSyed Asad A Shah, MD  triamcinolone ointment (KENALOG) 0.5 % Apply 1 application topically 2 (two) times daily. Patient not taking: Reported on 08/13/2016 01/23/16   Ellyn HackSyed Asad A Shah, MD     Allergies Review of patient's allergies indicates no known allergies.  Family History  Problem Relation Age of Onset  . Diabetes Mother   . Hypertension Father   . Diabetes Brother   . Depression Paternal Grandmother   . Hypertension Paternal Grandfather   . Heart disease Paternal Grandfather   . Cancer Paternal Grandfather     Prostate    Social History Social History  Substance Use Topics  . Smoking status: Never Smoker  . Smokeless tobacco: Never Used  . Alcohol use No    Review of Systems  Constitutional: No fever/chills Eyes: No visual changes.  ENT: No sore throat. Cardiovascular: Denies chest pain. Respiratory: Denies shortness of breath.Hemoptysis as above Gastrointestinal: No abdominal pain.   Musculoskeletal: Negative for back pain. Skin: Negative for rash. Neurological: Negative for headaches or weakness  10-point ROS otherwise  negative.  ____________________________________________   PHYSICAL EXAM:  VITAL SIGNS: ED Triage Vitals [09/23/16 1525]  Enc Vitals Group     BP (!) 139/97     Pulse Rate 92     Resp 18     Temp 98.4 F (36.9 C)     Temp src      SpO2 98 %     Weight 210 lb (95.3 kg)     Height 5\' 10"  (1.778 m)     Head Circumference      Peak Flow      Pain Score      Pain Loc      Pain Edu?      Excl. in GC?     Constitutional: Alert and oriented. No acute  distress. Pleasant and interactive Eyes: Conjunctivae are normal.   Nose: No congestion/rhinnorhea. Mouth/Throat: Mucous membranes are moist.    Cardiovascular: Normal rate, regular rhythm. Grossly normal heart sounds.  Good peripheral circulation. Respiratory: Normal respiratory effort.  No retractions. Lungs CTAB. Gastrointestinal: Soft and nontender. No distention.  No CVA tenderness. Genitourinary: deferred Musculoskeletal: No lower extremity tenderness nor edema.  Warm and well perfused Neurologic:  Normal speech and language. No gross focal neurologic deficits are appreciated.  Skin:  Skin is warm, dry and intact. No rash noted. Psychiatric: Mood and affect are normal. Speech and behavior are normal.  ____________________________________________   LABS (all labs ordered are listed, but only abnormal results are displayed)  Labs Reviewed  COMPREHENSIVE METABOLIC PANEL - Abnormal; Notable for the following:       Result Value   Glucose, Bld 128 (*)    All other components within normal limits  CBC WITH DIFFERENTIAL/PLATELET   ____________________________________________  EKG  None ____________________________________________  RADIOLOGY  Chest x-ray no acute distress ____________________________________________   PROCEDURES  Procedure(s) performed: No    Critical Care performed: No ____________________________________________   INITIAL IMPRESSION / ASSESSMENT AND PLAN / ED COURSE  Pertinent labs & imaging results that were available during my care of the patient were reviewed by me and considered in my medical decision making (see chart for details).  Patient with unremarkable x-ray, benign labs. However he has had acute onset of hemoptysis and he is a Marine scientist. Given his risk factors I feel ruling out PE is necessary, we will send for CT angiography.  Clinical Course  ____________________________________________ Patient CT scan  unremarkable, no PE. No hemoptysis in the emergency department. Stable for discharge. Return precautions discussed  FINAL CLINICAL IMPRESSION(S) / ED DIAGNOSES  Final diagnoses:  Hemoptysis      NEW MEDICATIONS STARTED DURING THIS VISIT:  New Prescriptions   No medications on file     Note:  This document was prepared using Dragon voice recognition software and may include unintentional dictation errors.    Jene Every, MD 09/23/16 [phone]

## 2016-10-29 ENCOUNTER — Encounter: Payer: Self-pay | Admitting: Family Medicine

## 2016-10-29 ENCOUNTER — Ambulatory Visit (INDEPENDENT_AMBULATORY_CARE_PROVIDER_SITE_OTHER): Payer: BLUE CROSS/BLUE SHIELD | Admitting: Family Medicine

## 2016-10-29 VITALS — BP 128/71 | HR 71 | Temp 97.9°F | Resp 16 | Ht 70.0 in | Wt 227.7 lb

## 2016-10-29 DIAGNOSIS — Z23 Encounter for immunization: Secondary | ICD-10-CM | POA: Diagnosis not present

## 2016-10-29 DIAGNOSIS — E1169 Type 2 diabetes mellitus with other specified complication: Secondary | ICD-10-CM

## 2016-10-29 DIAGNOSIS — E785 Hyperlipidemia, unspecified: Secondary | ICD-10-CM

## 2016-10-29 DIAGNOSIS — E669 Obesity, unspecified: Secondary | ICD-10-CM

## 2016-10-29 DIAGNOSIS — I10 Essential (primary) hypertension: Secondary | ICD-10-CM

## 2016-10-29 LAB — LIPID PANEL
CHOL/HDL RATIO: 4.5 ratio (ref <5.0)
CHOLESTEROL: 172 mg/dL (ref <200)
HDL: 38 mg/dL — AB (ref >40)
LDL CALC: 80 mg/dL
TRIGLYCERIDES: 272 mg/dL — AB (ref <150)
VLDL: 54 mg/dL — AB (ref <30)

## 2016-10-29 LAB — POCT GLYCOSYLATED HEMOGLOBIN (HGB A1C): Hemoglobin A1C: 6.1

## 2016-10-29 MED ORDER — METFORMIN HCL 850 MG PO TABS: 850.0000 mg | ORAL_TABLET | Freq: Two times a day (BID) | ORAL | 0 refills | Status: DC

## 2016-10-29 MED ORDER — AMLODIPINE BESYLATE 10 MG PO TABS: 10.0000 mg | ORAL_TABLET | Freq: Every day | ORAL | 0 refills | Status: DC

## 2016-10-29 MED ORDER — ATORVASTATIN CALCIUM 40 MG PO TABS: 40.0000 mg | ORAL_TABLET | Freq: Every day | ORAL | 0 refills | Status: DC

## 2016-10-29 NOTE — Progress Notes (Signed)
Name: Andrew Sawyer A Sewell   MRN: [id number]    DOB: [birth year]   Date:10/29/2016       Progress Note  Subjective  Chief Complaint  Chief Complaint  Patient presents with  . Follow-up    3 mo  . Medication Refill    atorvastatin     Diabetes  He presents for his follow-up diabetic visit. He has type 2 diabetes mellitus. His disease course has been improving. There are no hypoglycemic associated symptoms. Pertinent negatives for hypoglycemia include no headaches. Pertinent negatives for diabetes include no blurred vision, no chest pain, no fatigue, no polydipsia and no polyuria. Pertinent negatives for diabetic complications include no CVA. Current diabetic treatment includes oral agent (monotherapy). He is following a diabetic diet. An ACE inhibitor/angiotensin II receptor blocker is not being taken. Eye exam is not current.  Hypertension  This is a chronic problem. The problem is unchanged. The problem is controlled. Pertinent negatives include no blurred vision, chest pain, headaches, palpitations or shortness of breath. Past treatments include calcium channel blockers. There is no history of kidney disease, CAD/MI or CVA.  Hyperlipidemia  This is a chronic problem. The problem is controlled. Recent lipid tests were reviewed and are normal. Pertinent negatives include no chest pain, leg pain, myalgias or shortness of breath. Current antihyperlipidemic treatment includes statins.     Past Medical History:  Diagnosis Date  . Asthma, mild 03/30/2015  . Benign hypertension 03/30/2015  . Diabetes mellitus type 2 in obese (HCC) 03/30/2015  . Diabetes mellitus without complication (HCC)   . Hyperlipidemia   . Hypertension   . Situs inversus     Past Surgical History:  Procedure Laterality Date  . NASAL SINUS SURGERY    . RHINOPLASTY      Family History  Problem Relation Age of Onset  . Diabetes Mother   . Hypertension Father   . Diabetes Brother   . Depression Paternal Grandmother   .  Hypertension Paternal Grandfather   . Heart disease Paternal Grandfather   . Cancer Paternal Grandfather     Prostate    Social History   Social History  . Marital status: Married    Spouse name: N/A  . Number of children: N/A  . Years of education: N/A   Occupational History  . Not on file.   Social History Main Topics  . Smoking status: Never Smoker  . Smokeless tobacco: Never Used  . Alcohol use No  . Drug use: No  . Sexual activity: Not on file   Other Topics Concern  . Not on file   Social History Narrative  . No narrative on file     Current Outpatient Prescriptions:  .  amLODipine (NORVASC) 10 MG tablet, TAKE ONE TABLET BY MOUTH ONCE DAILY, Disp: 90 tablet, Rfl: 0 .  aspirin 81 MG chewable tablet, Chew 81 mg by mouth daily., Disp: , Rfl:  .  atorvastatin (LIPITOR) 40 MG tablet, Take 1 tablet (40 mg total) by mouth at bedtime., Disp: 90 tablet, Rfl: 0 .  Icosapent Ethyl (VASCEPA) 1 g CAPS, Take 2 capsules by mouth 2 (two) times daily after a meal., Disp: 360 capsule, Rfl: 0 .  metFORMIN (GLUCOPHAGE) 850 MG tablet, Take 1 tablet (850 mg total) by mouth 2 (two) times daily with a meal., Disp: 180 tablet, Rfl: 0 .  triamcinolone ointment (KENALOG) 0.5 %, Apply 1 application topically 2 (two) times daily., Disp: 30 g, Rfl: 0  No Known Allergies   Review of  Systems  Constitutional: Negative for fatigue.  Eyes: Negative for blurred vision.  Respiratory: Negative for shortness of breath.   Cardiovascular: Negative for chest pain and palpitations.  Musculoskeletal: Negative for myalgias.  Neurological: Negative for headaches.  Endo/Heme/Allergies: Negative for polydipsia.      Objective  Vitals:   10/29/16 0810  BP: 128/71  Pulse: 71  Resp: 16  Temp: 97.9 F (36.6 C)  TempSrc: Oral  SpO2: 98%  Weight: 227 lb 11.2 oz (103.3 kg)  Height: 5\' 10"  (1.778 m)    Physical Exam  Constitutional: He is oriented to person, place, and time and well-developed,  well-nourished, and in no distress.  HENT:  Head: Normocephalic and atraumatic.  Cardiovascular: Normal rate, regular rhythm and normal heart sounds.   No murmur heard. Pulmonary/Chest: Effort normal and breath sounds normal. He has no wheezes.  Musculoskeletal: He exhibits no edema.  Neurological: He is alert and oriented to person, place, and time.  Psychiatric: Mood, memory, affect and judgment normal.  Nursing note and vitals reviewed.   Assessment & Plan  1. Diabetes mellitus type 2 in obese (HCC) A1c is 6.1%, diabetes at goal, continue present pharmacotherapy - metFORMIN (GLUCOPHAGE) 850 MG tablet; Take 1 tablet (850 mg total) by mouth 2 (two) times daily with a meal.  Dispense: 180 tablet; Refill: 0 - POCT HgB A1C  2. Dyslipidemia Repeat fasting lipid profile, consider treatment for elevated triglycerides - atorvastatin (LIPITOR) 40 MG tablet; Take 1 tablet (40 mg total) by mouth at bedtime.  Dispense: 90 tablet; Refill: 0 - Lipid Profile  3. Benign hypertension  - amLODipine (NORVASC) 10 MG tablet; Take 1 tablet (10 mg total) by mouth daily.  Dispense: 90 tablet; Refill: 0  4. Need for influenza vaccination  - Flu Vaccine QUAD 36+ mos PF IM (Fluarix & Fluzone Quad PF)   Nadezhda Pollitt Asad A. Faylene KurtzShah [facility] 10/29/2016 8:28 AM

## 2016-12-10 DIAGNOSIS — E119 Type 2 diabetes mellitus without complications: Secondary | ICD-10-CM | POA: Diagnosis not present

## 2016-12-21 ENCOUNTER — Encounter: Payer: Self-pay | Admitting: Family Medicine

## 2017-01-09 IMAGING — US US ABDOMEN COMPLETE
1 series · 14 of 25 positions shown · non-contrast
Comparison: None in PACs

CLINICAL DATA: Elevated liver enzymes, known solitus and versus

EXAM:
ULTRASOUND ABDOMEN COMPLETE

[Series 1: us abdomen complete · 0.26mm/px · 14 of 82 slices shown]
[im 1/82]
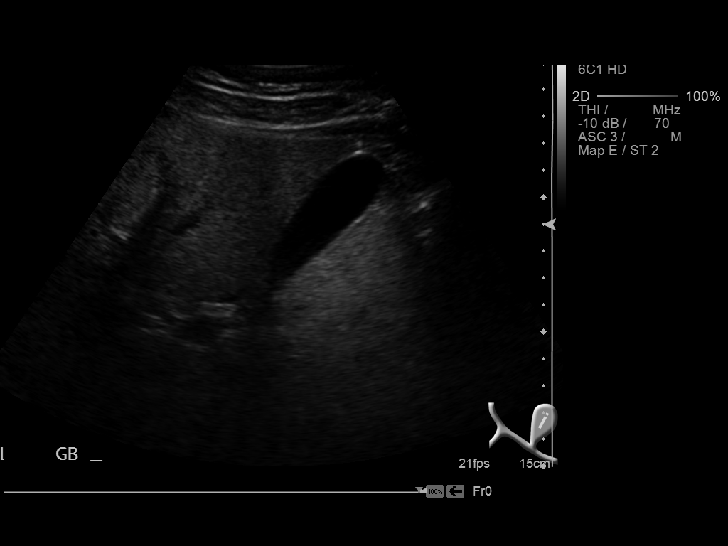
[im 7/82]
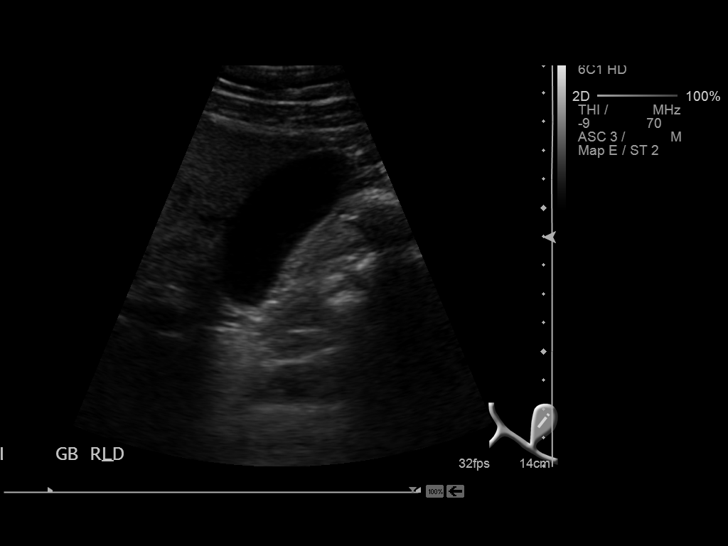
[im 14/82]
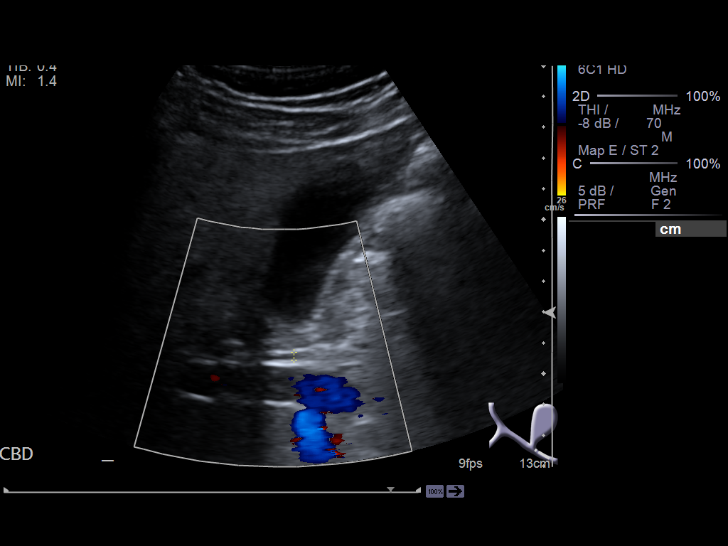
[im 21/82]
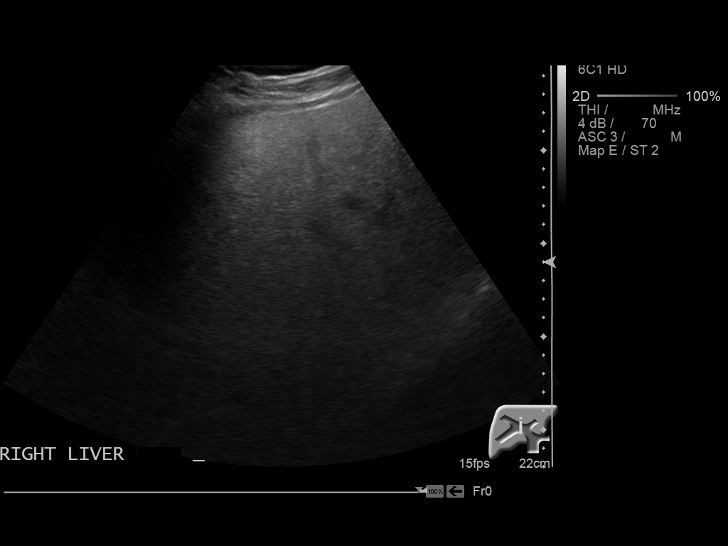
[im 28/82]
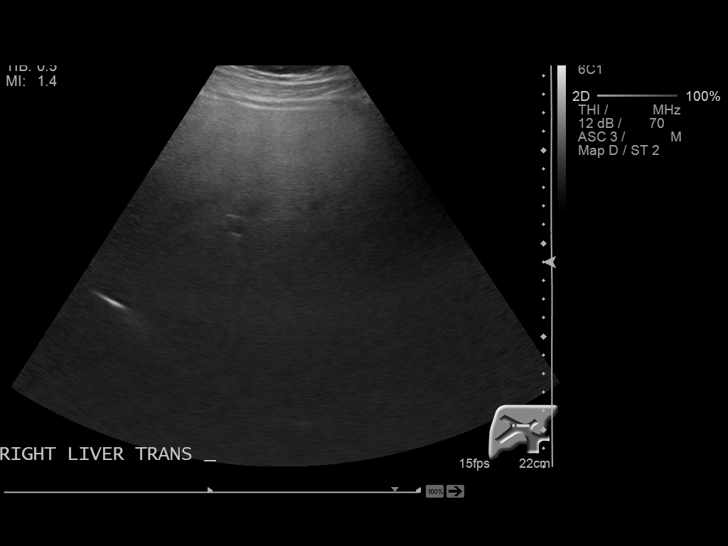
[im 31/82]
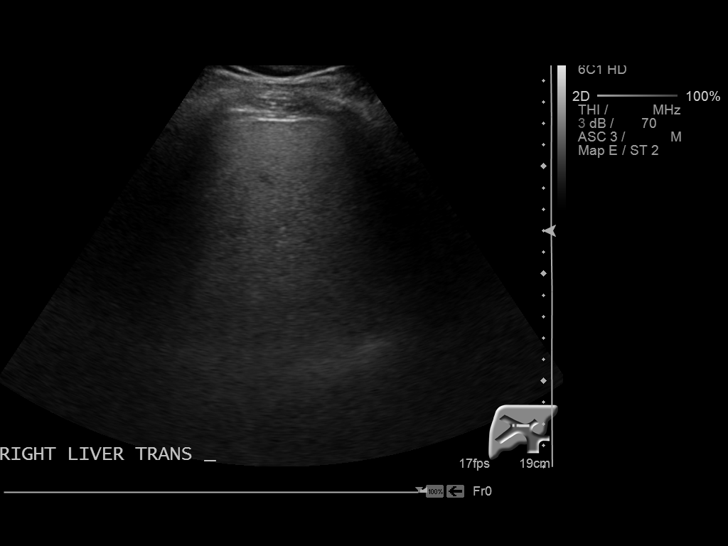
[im 38/82]
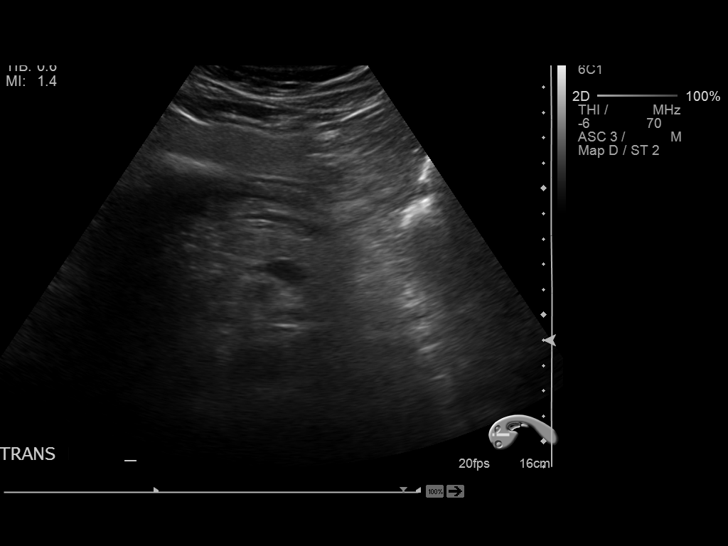
[im 44/82]
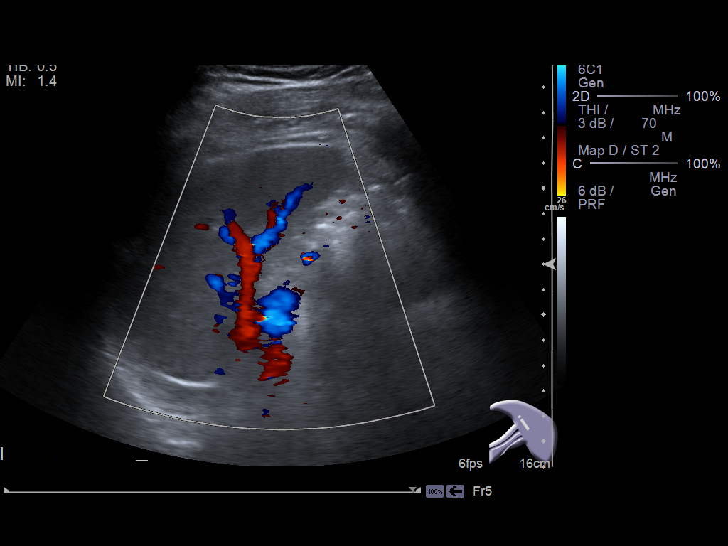
[im 51/82]
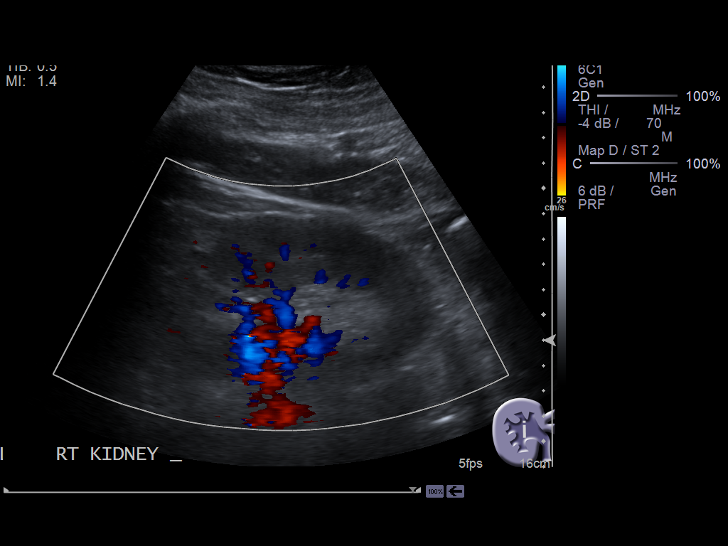
[im 55/82]
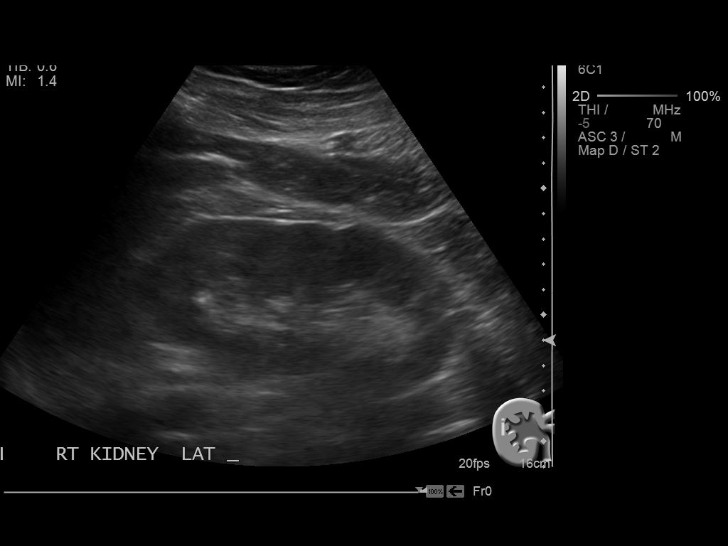
[im 61/82]
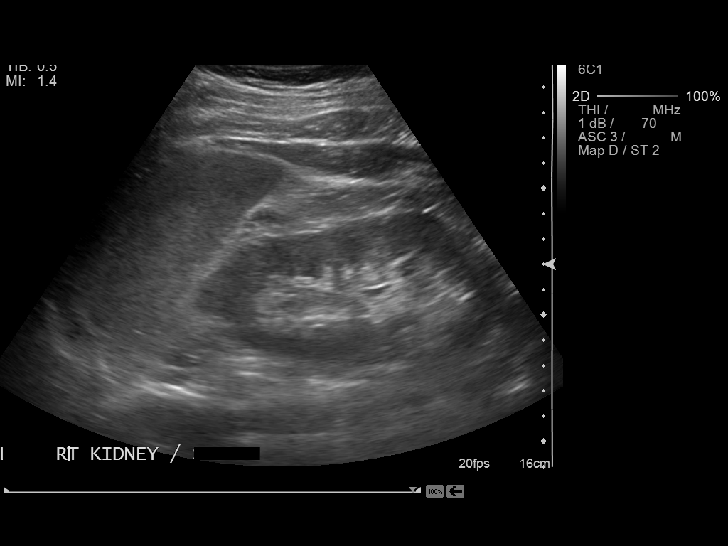
[im 68/82]
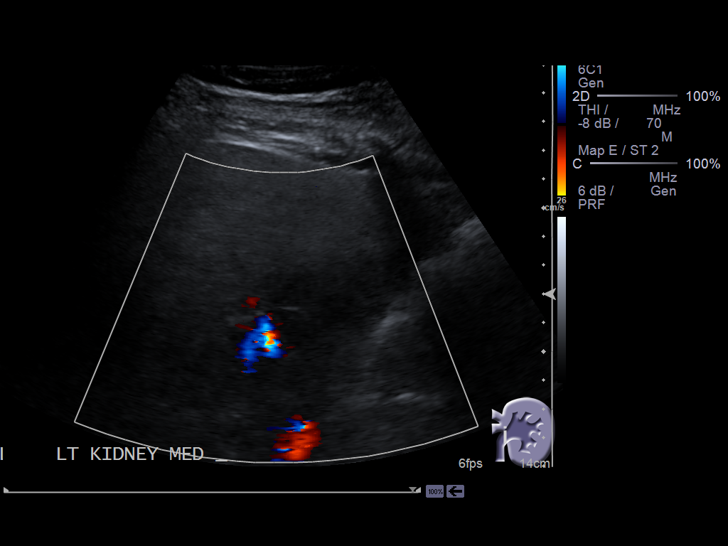
[im 75/82]
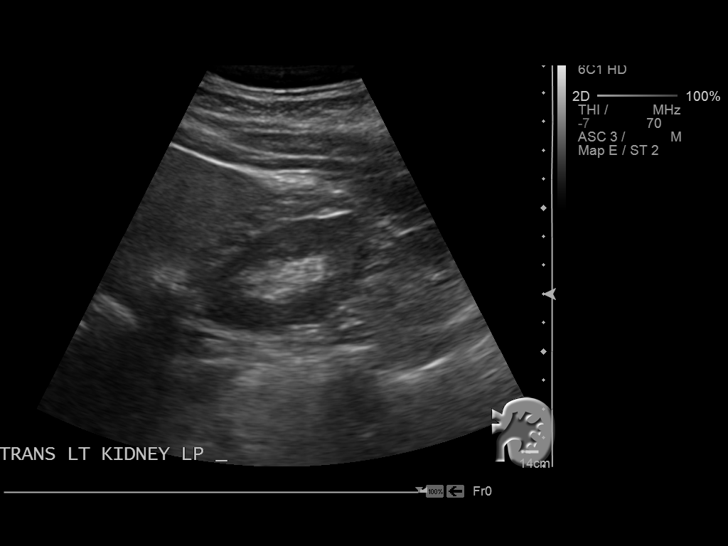
[im 82/82]
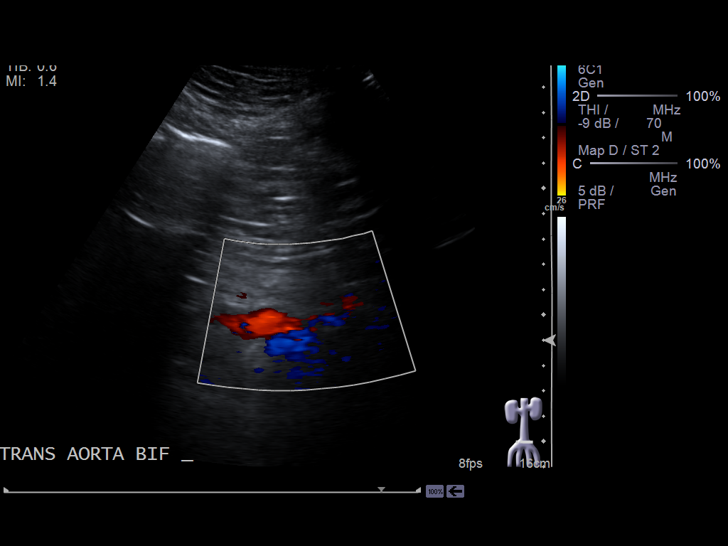

[14 of 25 positions shown; findings below may reference images not displayed]

FINDINGS: Gallbladder: No gallstones or wall thickening visualized. No
sonographic Murphy sign noted.

Common bile duct: Diameter: 2.5 mm

Liver: The which are too the hepatic tissue is situated to the left
of midline. The hepatic echotexture is mildly increased. There is no
focal mass or ductal dilation.

IVC: No abnormality visualized.

Pancreas: Visualized portion unremarkable. The tail was obscured by
bowel gas.

Spleen: The spleen is located to the right of midline. Size and
appearance within normal limits.

Right Kidney: Length: 12.8 cm. Echogenicity within normal limits. No
mass or hydronephrosis visualized.

Left Kidney: Length: 12.9 cm. Echogenicity within normal limits. No
mass or hydronephrosis visualized.

Abdominal aorta: No aneurysm visualized.

Other findings: There is no ascites.
IMPRESSION: 1. Situs inversus of the abdominal organs.
2. Increased hepatic echotexture consistent with fatty infiltrative
change.

## 2017-02-04 ENCOUNTER — Ambulatory Visit (INDEPENDENT_AMBULATORY_CARE_PROVIDER_SITE_OTHER): Payer: BLUE CROSS/BLUE SHIELD | Admitting: Family Medicine

## 2017-02-04 ENCOUNTER — Ambulatory Visit
Admission: EM | Admit: 2017-02-04 | Discharge: 2017-02-04 | Discharge status: Absent without leave | Payer: BLUE CROSS/BLUE SHIELD

## 2017-02-04 ENCOUNTER — Encounter: Payer: Self-pay | Admitting: Family Medicine

## 2017-02-04 DIAGNOSIS — I10 Essential (primary) hypertension: Secondary | ICD-10-CM | POA: Diagnosis not present

## 2017-02-04 DIAGNOSIS — E785 Hyperlipidemia, unspecified: Secondary | ICD-10-CM | POA: Diagnosis not present

## 2017-02-04 DIAGNOSIS — E669 Obesity, unspecified: Secondary | ICD-10-CM | POA: Diagnosis not present

## 2017-02-04 DIAGNOSIS — E1169 Type 2 diabetes mellitus with other specified complication: Secondary | ICD-10-CM | POA: Diagnosis not present

## 2017-02-04 LAB — COMPLETE METABOLIC PANEL WITH GFR
ALBUMIN: 4.6 g/dL (ref 3.6–5.1)
ALK PHOS: 72 U/L (ref 40–115)
ALT: 42 U/L (ref 9–46)
AST: 25 U/L (ref 10–40)
BUN: 14 mg/dL (ref 7–25)
CHLORIDE: 103 mmol/L (ref 98–110)
CO2: 27 mmol/L (ref 20–31)
Calcium: 9.5 mg/dL (ref 8.6–10.3)
Creat: 0.96 mg/dL (ref 0.60–1.35)
GFR, Est African American: >89 mL/min (ref >=60)
GLUCOSE: 128 mg/dL — AB (ref 65–99)
POTASSIUM: 4.1 mmol/L (ref 3.5–5.3)
SODIUM: 139 mmol/L (ref 135–146)
Total Bilirubin: 0.5 mg/dL (ref 0.2–1.2)
Total Protein: 7.6 g/dL (ref 6.1–8.1)

## 2017-02-04 LAB — LIPID PANEL
CHOL/HDL RATIO: 4 ratio (ref <5.0)
Cholesterol: 161 mg/dL (ref <200)
HDL: 40 mg/dL — AB (ref >40)
LDL Cholesterol: 90 mg/dL (ref <100)
Triglycerides: 155 mg/dL — ABNORMAL HIGH (ref <150)
VLDL: 31 mg/dL — AB (ref <30)

## 2017-02-04 LAB — POCT CBG (FASTING - GLUCOSE)-MANUAL ENTRY: GLUCOSE FASTING, POC: 139 mg/dL — AB (ref 70–99)

## 2017-02-04 LAB — POCT GLYCOSYLATED HEMOGLOBIN (HGB A1C): HEMOGLOBIN A1C: 6.6

## 2017-02-04 MED ORDER — METFORMIN HCL 850 MG PO TABS: 850.0000 mg | ORAL_TABLET | Freq: Two times a day (BID) | ORAL | 0 refills | Status: DC

## 2017-02-04 MED ORDER — ATORVASTATIN CALCIUM 40 MG PO TABS
40.0000 mg | ORAL_TABLET | Freq: Every day | ORAL | 0 refills | Status: DC
Start: 2017-02-04 — End: 2017-05-06

## 2017-02-04 MED ORDER — AMLODIPINE BESYLATE 10 MG PO TABS: 10.0000 mg | ORAL_TABLET | Freq: Every day | ORAL | 0 refills | Status: DC

## 2017-02-04 NOTE — Progress Notes (Signed)
Name: Andrew HarryJoseph A Sawyer   MRN: [id number]    DOB: 1976-09-09   Date:02/04/2017       Progress Note  Subjective  Chief Complaint  Chief Complaint  Patient presents with  . Follow-up    3 mo  . Medication Refill    Diabetes  He presents for his follow-up diabetic visit. He has type 2 diabetes mellitus. His disease course has been stable. There are no hypoglycemic associated symptoms. Pertinent negatives for hypoglycemia include no headaches. Pertinent negatives for diabetes include no blurred vision, no chest pain, no fatigue, no polydipsia and no polyuria. Pertinent negatives for diabetic complications include no CVA. Current diabetic treatment includes oral agent (monotherapy). He is following a generally healthy diet. His breakfast blood glucose range is generally 110-130 mg/dl. An ACE inhibitor/angiotensin II receptor blocker is not being taken. Eye exam is not current.  Hypertension  This is a chronic problem. The problem is unchanged. The problem is controlled. Pertinent negatives include no blurred vision, chest pain, headaches, palpitations or shortness of breath. Past treatments include calcium channel blockers. There is no history of kidney disease, CAD/MI or CVA.  Hyperlipidemia  This is a chronic problem. The problem is controlled. Recent lipid tests were reviewed and are normal. Pertinent negatives include no chest pain, leg pain, myalgias or shortness of breath. Current antihyperlipidemic treatment includes statins (Takes Atorvasttain plus OTC Fish oil.).     Past Medical History:  Diagnosis Date  . Asthma, mild 03/30/2015  . Benign hypertension 03/30/2015  . Diabetes mellitus type 2 in obese (HCC) 03/30/2015  . Diabetes mellitus without complication (HCC)   . Hyperlipidemia   . Hypertension   . Situs inversus     Past Surgical History:  Procedure Laterality Date  . NASAL SINUS SURGERY    . RHINOPLASTY      Family History  Problem Relation Age of Onset  . Diabetes Mother    . Hypertension Father   . Diabetes Brother   . Depression Paternal Grandmother   . Hypertension Paternal Grandfather   . Heart disease Paternal Grandfather   . Cancer Paternal Grandfather     Prostate    Social History   Social History  . Marital status: Married    Spouse name: N/A  . Number of children: N/A  . Years of education: N/A   Occupational History  . Not on file.   Social History Main Topics  . Smoking status: Never Smoker  . Smokeless tobacco: Never Used  . Alcohol use No  . Drug use: No  . Sexual activity: Not on file   Other Topics Concern  . Not on file   Social History Narrative  . No narrative on file     Current Outpatient Prescriptions:  .  amLODipine (NORVASC) 10 MG tablet, Take 1 tablet (10 mg total) by mouth daily., Disp: 90 tablet, Rfl: 0 .  aspirin 81 MG chewable tablet, Chew 81 mg by mouth daily., Disp: , Rfl:  .  atorvastatin (LIPITOR) 40 MG tablet, Take 1 tablet (40 mg total) by mouth at bedtime., Disp: 90 tablet, Rfl: 0 .  Icosapent Ethyl (VASCEPA) 1 g CAPS, Take 2 capsules by mouth 2 (two) times daily after a meal., Disp: 360 capsule, Rfl: 0 .  metFORMIN (GLUCOPHAGE) 850 MG tablet, Take 1 tablet (850 mg total) by mouth 2 (two) times daily with a meal., Disp: 180 tablet, Rfl: 0 .  triamcinolone ointment (KENALOG) 0.5 %, Apply 1 application topically 2 (two) times daily.,  Disp: 30 g, Rfl: 0  No Known Allergies   Review of Systems  Constitutional: Negative for fatigue.  Eyes: Negative for blurred vision.  Respiratory: Negative for shortness of breath.   Cardiovascular: Negative for chest pain and palpitations.  Musculoskeletal: Negative for myalgias.  Neurological: Negative for headaches.  Endo/Heme/Allergies: Negative for polydipsia.     Objective  Vitals:   02/04/17 0817  BP: 128/78  Pulse: 68  Resp: 16  Temp: 98 F (36.7 C)  TempSrc: Oral  SpO2: 96%  Weight: 226 lb 1.6 oz (102.6 kg)  Height: 5\' 10"  (1.778 m)     Physical Exam  Constitutional: He is oriented to person, place, and time and well-developed, well-nourished, and in no distress.  HENT:  Head: Normocephalic and atraumatic.  Cardiovascular: Normal rate, regular rhythm and normal heart sounds.   No murmur heard. Pulmonary/Chest: Effort normal and breath sounds normal. He has no wheezes. He has no rales.  Abdominal: Soft. Bowel sounds are normal. There is no tenderness.  Musculoskeletal: He exhibits no edema.  Neurological: He is alert and oriented to person, place, and time.  Psychiatric: Mood, memory, affect and judgment normal.  Nursing note and vitals reviewed.       Assessment & Plan  1. Benign hypertension Blood pressure is well controlled on present therapy - amLODipine (NORVASC) 10 MG tablet; Take 1 tablet (10 mg total) by mouth daily.  Dispense: 90 tablet; Refill: 0  2. Diabetes mellitus type 2 in obese (HCC) A1c 6.6%, well-controlled diabetes, continuing pharmacotherapy as prescribed - metFORMIN (GLUCOPHAGE) 850 MG tablet; Take 1 tablet (850 mg total) by mouth 2 (two) times daily with a meal.  Dispense: 180 tablet; Refill: 0 - POCT CBG (Fasting - Glucose) - POCT glycosylated hemoglobin (Hb A1C)  3. Dyslipidemia Obtain FLP, patient is now taking OTC fish oil. - atorvastatin (LIPITOR) 40 MG tablet; Take 1 tablet (40 mg total) by mouth at bedtime.  Dispense: 90 tablet; Refill: 0 - COMPLETE METABOLIC PANEL WITH GFR - Lipid panel   Morganne Haile Asad A. Faylene Kurtz [facility] 02/04/2017 8:30 AM

## 2017-05-06 ENCOUNTER — Ambulatory Visit: Payer: BLUE CROSS/BLUE SHIELD | Admitting: Family Medicine

## 2017-05-06 ENCOUNTER — Encounter: Payer: Self-pay | Admitting: Family Medicine

## 2017-05-06 ENCOUNTER — Ambulatory Visit (INDEPENDENT_AMBULATORY_CARE_PROVIDER_SITE_OTHER): Payer: BLUE CROSS/BLUE SHIELD | Admitting: Family Medicine

## 2017-05-06 VITALS — BP 124/76 | HR 65 | Temp 98.1°F | Resp 18 | Ht 70.0 in | Wt 232.9 lb

## 2017-05-06 DIAGNOSIS — E669 Obesity, unspecified: Secondary | ICD-10-CM

## 2017-05-06 DIAGNOSIS — E785 Hyperlipidemia, unspecified: Secondary | ICD-10-CM

## 2017-05-06 DIAGNOSIS — L309 Dermatitis, unspecified: Secondary | ICD-10-CM | POA: Diagnosis not present

## 2017-05-06 DIAGNOSIS — E1169 Type 2 diabetes mellitus with other specified complication: Secondary | ICD-10-CM

## 2017-05-06 DIAGNOSIS — I10 Essential (primary) hypertension: Secondary | ICD-10-CM | POA: Diagnosis not present

## 2017-05-06 LAB — GLUCOSE, POCT (MANUAL RESULT ENTRY): POC GLUCOSE: 143 mg/dL — AB (ref 70–99)

## 2017-05-06 LAB — POCT GLYCOSYLATED HEMOGLOBIN (HGB A1C): HEMOGLOBIN A1C: 6.9

## 2017-05-06 MED ORDER — METFORMIN HCL 1000 MG PO TABS: 1000.0000 mg | ORAL_TABLET | Freq: Two times a day (BID) | ORAL | 0 refills | Status: DC

## 2017-05-06 MED ORDER — TRIAMCINOLONE ACETONIDE 0.5 % EX OINT: 1.0000 "application " | TOPICAL_OINTMENT | Freq: Two times a day (BID) | CUTANEOUS | 0 refills | Status: DC

## 2017-05-06 MED ORDER — AMLODIPINE BESYLATE 10 MG PO TABS: 10.0000 mg | ORAL_TABLET | Freq: Every day | ORAL | 0 refills | Status: DC

## 2017-05-06 MED ORDER — ATORVASTATIN CALCIUM 40 MG PO TABS: 40.0000 mg | ORAL_TABLET | Freq: Every day | ORAL | 0 refills | Status: DC

## 2017-05-06 NOTE — Progress Notes (Addendum)
Name: Andrew Sawyer   MRN: [id number]    DOB: Oct 15, 1976   Date:05/06/2017       Progress Note  Subjective  Chief Complaint  Chief Complaint  Patient presents with  . Diabetes    follow up, med refill  . Hypertension  . Hyperlipidemia    HPI  Diabetes  He presents for his follow-up diabetic visit. He has type 2 diabetes mellitus. His A1C is up today from his last visit - A1C on 02/04/17 6.6% and is 6.9 today. There are no hypoglycemic associated symptoms. Pertinent negatives for hypoglycemia include no headaches,  blurred vision, no chest pain, no fatigue, no polydipsia. Polyphagia, or polyuria. Current diabetic treatment includes oral agent (monotherapy). 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 His am blood glucose range is generally 100-150 mg/dl. An ACE inhibitor/angiotensin II receptor blocker is not being taken. Pt states had eye exam in December 2017 - Report in chart and states no retinopathy.  Hypertension   This is a chronic problem. The problem is unchanged. The problem is controlled. Pertinent negatives include no blurred vision, chest pain, headaches, palpitations or shortness of breath, or edema. Current treatment is Amlodipine. There is no history of kidney disease, CAD/MI or CVA.  Hyperlipidemia  This is a chronic problem. The problem is controlled. Recent lipid tests were reviewed and were improved overall. Pertinent negatives include no chest pain, leg pain, myalgias or shortness of breath. 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 Triamcinolone cream works well for him.  Patient Active Problem List   Diagnosis  Date Noted  . Hypertriglyceridemia 08/13/2016  . Dermatitis of lower extremity 01/23/2016  . Elevated liver enzymes 07/25/2015  . Asthma, mild 03/30/2015  . Diabetes mellitus type 2 in obese (HCC) 03/30/2015  . Routine general medical examination at a health care facility 03/30/2015  . Difficulty hearing 03/30/2015  . Brash 03/30/2015  . Dyslipidemia 03/30/2015  . Blood glucose elevated 03/30/2015  . Benign hypertension 03/30/2015  . Flu vaccine need 03/30/2015  . Adiposity 03/30/2015  . Pneumococcal vaccination given 03/30/2015  . Abnormal presence of protein in urine 03/30/2015    Past Surgical History:  Procedure Laterality Date  . NASAL SINUS SURGERY    . RHINOPLASTY      Family History  Problem Relation Age of Onset  . Diabetes Mother   . Hypertension Father   . Diabetes Brother   . Depression Paternal Grandmother   . Hypertension Paternal Grandfather   . Heart disease Paternal Grandfather   . Cancer Paternal Grandfather        Prostate    Social History   Social History  . Marital status: Married    Spouse name: N/A  . Number of children: N/A  . Years of education: N/A   Occupational History  . Not on file.   Social History Main Topics  . Smoking status: Never Smoker  . Smokeless tobacco: Never Used  . Alcohol use No  . Drug use: No  . Sexual activity: Not on file   Other Topics Concern  . Not on file   Social History Narrative  . No narrative on  file     Current Outpatient Prescriptions:  .  amLODipine (NORVASC) 10 MG tablet, Take 1 tablet (10 mg total) by mouth daily., Disp: 90 tablet, Rfl: 0 .  aspirin 81 MG chewable tablet, Chew 81 mg by mouth daily., Disp: , Rfl:  .  atorvastatin (LIPITOR) 40 MG tablet, Take 1 tablet (40 mg total) by mouth at bedtime., Disp: 90 tablet, Rfl: 0 .  triamcinolone ointment (KENALOG) 0.5 %, Apply 1 application topically 2 (two) times daily., Disp: 30 g, Rfl: 0 .  metFORMIN (GLUCOPHAGE) 1000 MG tablet, Take 1  tablet (1,000 mg total) by mouth 2 (two) times daily with a meal., Disp: 180 tablet, Rfl: 0  No Known Allergies   ROS  Constitutional: Negative for fever or weight change.  Respiratory: Negative for cough and shortness of breath.   Cardiovascular: Negative for chest pain or palpitations.  Gastrointestinal: Negative for abdominal pain, no bowel changes.  Musculoskeletal: Negative for gait problem or joint swelling.  Skin: Negative for rash.  Neurological: Negative for dizziness or headache.  No other specific complaints in a complete review of systems (except as listed in HPI above).  Objective  Vitals:   05/06/17 0823  BP: 124/76  Pulse: 65  Resp: 18  Temp: 98.1 F (36.7 C)  TempSrc: Oral  SpO2: 97%  Weight: 232 lb 14.4 oz (105.6 kg)  Height: 5\' 10"  (1.778 m)    Body mass index is 33.42 kg/m.  Physical Exam Constitutional: Patient appears well-developed and well-nourished. Obese No distress.  HEENT: head atraumatic, normocephalic Cardiovascular: Normal rate, regular rhythm and normal heart sounds.  No murmur heard. Mild non-pitting BLE edema. Pulmonary/Chest: Effort normal and breath sounds normal. No respiratory distress. Abdominal: Soft.  There is no tenderness. Psychiatric: Patient has a normal mood and affect. behavior is normal. Judgment and thought content normal.  Recent Results (from the past 2160 hour(s))  POCT Glucose (CBG)     Status: Abnormal   Collection Time: 05/06/17  8:25 AM  Result Value Ref Range   POC Glucose 143 (A) 70 - 99 mg/dl  POCT HgB W2N     Status: None   Collection Time: 05/06/17  9:07 AM  Result Value Ref Range   Hemoglobin A1C 6.9     Diabetic Foot Exam: Diabetic Foot Exam - Simple   Simple Foot Form Visual Inspection No deformities, no ulcerations, no other skin breakdown bilaterally:  Yes Sensation Testing Intact to touch and monofilament testing bilaterally:  Yes Pulse Check Posterior Tibialis and Dorsalis pulse intact  bilaterally:  Yes Comments    PHQ2/9: Depression screen [facility] 2/9 02/04/2017 10/29/2016 04/23/2016 01/23/2016 09/26/2015  Decreased Interest 0 0 0 0 0  Down, Depressed, Hopeless 0 0 0 0 0  PHQ - 2 Score 0 0 0 0 0    Fall Risk: Fall Risk  02/04/2017 10/29/2016 04/23/2016 01/23/2016 09/26/2015  Falls in the past year? No No No No No    Assessment & Plan  1. Diabetes mellitus type 2 in obese (HCC) - POCT HgB A1C - POCT Glucose (CBG) - metFORMIN (GLUCOPHAGE) 1000 MG tablet; Take 1 tablet (1,000 mg total) by mouth 2 (two) times daily with a meal.  Dispense: 180 tablet; Refill: 0 - Increased dose of Metformin from 850mg  BID to 1000mg  BID due to slight increase in A1C. - Discussed diet and exercise at length, patient will try to walk a few times a week and choose healthier options while on the road. Utilizes the nutritionist through his  workplace every 3 months.  2. Dyslipidemia  - atorvastatin (LIPITOR) 40 MG tablet; Take 1 tablet (40 mg total) by mouth at bedtime.  Dispense: 90 tablet; Refill: 0  3. Benign hypertension  - amLODipine (NORVASC) 10 MG tablet; Take 1 tablet (10 mg total) by mouth daily.  Dispense: 90 tablet; Refill: 0 - Discussed elevated BLE at night while sleeping.  4. Dermatitis of lower extremity  - triamcinolone ointment (KENALOG) 0.5 %; Apply 1 application topically 2 (two) times daily.  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   -Reviewed Health Maintenance: Foot exam and eye exam updated in chart.  I have reviewed this encounter including the documentation in this note and/or discussed this patient with the Deboraha Sprang, FNP, NP-C. I am certifying that I agree with the content of this note as supervising physician.  Alba Cory, MD [facility] 05/06/2017, 5:13 PM

## 2017-05-06 NOTE — Patient Instructions (Addendum)
Carbohydrate Counting for Diabetes Mellitus, Adult Carbohydrate counting is a method for keeping track of how many carbohydrates you eat. Eating carbohydrates naturally increases the amount of sugar (glucose) in the blood. Counting how many carbohydrates you eat helps keep your blood glucose within normal limits, which helps you manage your diabetes (diabetes mellitus). It is important to know how many carbohydrates you can safely have in each meal. This is different for every person. A diet and nutrition specialist (registered dietitian) can help you make a meal plan and calculate how many carbohydrates you should have at each meal and snack. Carbohydrates are found in the following foods:  Grains, such as breads and cereals.  Dried beans and soy products.  Starchy vegetables, such as potatoes, peas, and corn.  Fruit and fruit juices.  Milk and yogurt.  Sweets and snack foods, such as cake, cookies, candy, chips, and soft drinks. How do I count carbohydrates? There are two ways to count carbohydrates in food. You can use either of the methods or a combination of both. Reading "Nutrition Facts" on packaged food  The "Nutrition Facts" list is included on the labels of almost all packaged foods and beverages in the [place] It includes:  The serving size.  Information about nutrients in each serving, including the grams (g) of carbohydrate per serving. To use the "Nutrition Facts":  Decide how many servings you will have.  Multiply the number of servings by the number of carbohydrates per serving.  The resulting number is the total amount of carbohydrates that you will be having. Learning standard serving sizes of other foods  When you eat foods containing carbohydrates that are not packaged or do not include "Nutrition Facts" on the label, you need to measure the servings in order to count the amount of carbohydrates:  Measure the foods that you will eat with a food scale or measuring  cup, if needed.  Decide how many standard-size servings you will eat.  Multiply the number of servings by 15. Most carbohydrate-rich foods have about 15 g of carbohydrates per serving.  For example, if you eat 8 oz (170 g) of strawberries, you will have eaten 2 servings and 30 g of carbohydrates (2 servings x 15 g = 30 g).  For foods that have more than one food mixed, such as soups and casseroles, you must count the carbohydrates in each food that is included. The following list contains standard serving sizes of common carbohydrate-rich foods. Each of these servings has about 15 g of carbohydrates:   hamburger bun or  English muffin.   oz (15 mL) syrup.   oz (14 g) jelly.  1 slice of bread.  1 six-inch tortilla.  3 oz (85 g) cooked rice or pasta.  4 oz (113 g) cooked dried beans.  4 oz (113 g) starchy vegetable, such as peas, corn, or potatoes.  4 oz (113 g) hot cereal.  4 oz (113 g) mashed potatoes or  of a large baked potato.  4 oz (113 g) canned or frozen fruit.  4 oz (120 mL) fruit juice.  4-6 crackers.  6 chicken nuggets.  6 oz (170 g) unsweetened dry cereal.  6 oz (170 g) plain fat-free yogurt or yogurt sweetened with artificial sweeteners.  8 oz (240 mL) milk.  8 oz (170 g) fresh fruit or one small piece of fruit.  24 oz (680 g) popped popcorn. Example of carbohydrate counting Sample meal   3 oz (85 g) chicken breast.  6  oz (170 g) brown rice.  4 oz (113 g) corn.  8 oz (240 mL) milk.  8 oz (170 g) strawberries with sugar-free whipped topping. Carbohydrate calculation  1. Identify the foods that contain carbohydrates:  Rice.  Corn.  Milk.  Strawberries. 2. Calculate how many servings you have of each food:  2 servings rice.  1 serving corn.  1 serving milk.  1 serving strawberries. 3. Multiply each number of servings by 15 g:  2 servings rice x 15 g = 30 g.  1 serving corn x 15 g = 15 g.  1 serving milk x 15 g = 15  g.  1 serving strawberries x 15 g = 15 g. 4. Add together all of the amounts to find the total grams of carbohydrates eaten:  30 g + 15 g + 15 g + 15 g = 75 g of carbohydrates total. This information is not intended to replace advice given to you by your health care provider. Make sure you discuss any questions you have with your health care provider. Document Released: 12/10/2005 Document Revised: 06/29/2016 Document Reviewed: 05/23/2016 Elsevier Interactive Patient Education  2017 [employer] Diabetes Mellitus and Food It is important for you to manage your blood sugar (glucose) level. Your blood glucose level can be greatly affected by what you eat. Eating healthier foods in the appropriate amounts throughout the day at about the same time each day will help you control your blood glucose level. It can also help slow or prevent worsening of your diabetes mellitus. Healthy eating may even help you improve the level of your blood pressure and reach or maintain a healthy weight. General recommendations for healthful eating and cooking habits include:  Eating meals and snacks regularly. Avoid going long periods of time without eating to lose weight.  Eating a diet that consists mainly of plant-based foods, such as fruits, vegetables, nuts, legumes, and whole grains.  Using low-heat cooking methods, such as baking, instead of high-heat cooking methods, such as deep frying. Work with your dietitian to make sure you understand how to use the Nutrition Facts information on food labels. How can food affect me? Carbohydrates  Carbohydrates affect your blood glucose level more than any other type of food. Your dietitian will help you determine how many carbohydrates to eat at each meal and teach you how to count carbohydrates. Counting carbohydrates is important to keep your blood glucose at a healthy level, especially if you are using insulin or taking certain medicines for diabetes  mellitus. Alcohol  Alcohol can cause sudden decreases in blood glucose (hypoglycemia), especially if you use insulin or take certain medicines for diabetes mellitus. Hypoglycemia can be a life-threatening condition. Symptoms of hypoglycemia (sleepiness, dizziness, and disorientation) are similar to symptoms of having too much alcohol. If your health care provider has given you approval to drink alcohol, do so in moderation and use the following guidelines:  Women should not have more than one drink per day, and men should not have more than two drinks per day. One drink is equal to:  12 oz of beer.  5 oz of wine.  1 oz of hard liquor.  Do not drink on an empty stomach.  Keep yourself hydrated. Have water, diet soda, or unsweetened iced tea.  Regular soda, juice, and other mixers might contain a lot of carbohydrates and should be counted. What foods are not recommended? As you make food choices, it is important to remember that all foods are not  the same. Some foods have fewer nutrients per serving than other foods, even though they might have the same number of calories or carbohydrates. It is difficult to get your body what it needs when you eat foods with fewer nutrients. Examples of foods that you should avoid that are high in calories and carbohydrates but low in nutrients include:  Trans fats (most processed foods list trans fats on the Nutrition Facts label).  Regular soda.  Juice.  Candy.  Sweets, such as cake, pie, doughnuts, and cookies.  Fried foods. What foods can I eat? Eat nutrient-rich foods, which will nourish your body and keep you healthy. The food you should eat also will depend on several factors, including:  The calories you need.  The medicines you take.  Your weight.  Your blood glucose level.  Your blood pressure level.  Your cholesterol level. You should eat a variety of foods, including:  Protein.  Lean cuts of meat.  Proteins low in  saturated fats, such as fish, egg whites, and beans. Avoid processed meats.  Fruits and vegetables.  Fruits and vegetables that may help control blood glucose levels, such as apples, mangoes, and yams.  Dairy products.  Choose fat-free or low-fat dairy products, such as milk, yogurt, and cheese.  Grains, bread, pasta, and rice.  Choose whole grain products, such as multigrain bread, whole oats, and brown rice. These foods may help control blood pressure.  Fats.  Foods containing healthful fats, such as nuts, avocado, olive oil, canola oil, and fish. Does everyone with diabetes mellitus have the same meal plan? Because every person with diabetes mellitus is different, there is not one meal plan that works for everyone. It is very important that you meet with a dietitian who will help you create a meal plan that is just right for you. This information is not intended to replace advice given to you by your health care provider. Make sure you discuss any questions you have with your health care provider. Document Released: 09/06/2005 Document Revised: 05/17/2016 Document Reviewed: 11/06/2013 Elsevier Interactive Patient Education  2017 [employer].

## 2017-08-01 ENCOUNTER — Encounter: Payer: Self-pay | Admitting: Family Medicine

## 2017-08-01 ENCOUNTER — Other Ambulatory Visit: Payer: Self-pay | Admitting: Family Medicine

## 2017-08-01 DIAGNOSIS — E785 Hyperlipidemia, unspecified: Secondary | ICD-10-CM

## 2017-08-01 DIAGNOSIS — E1169 Type 2 diabetes mellitus with other specified complication: Secondary | ICD-10-CM

## 2017-08-01 DIAGNOSIS — E669 Obesity, unspecified: Secondary | ICD-10-CM

## 2017-08-01 NOTE — Telephone Encounter (Signed)
30-day supply provided to get patient to next appointment on 08/19/2017.  Additional refills to be provided at that time.

## 2017-08-05 ENCOUNTER — Ambulatory Visit: Payer: BLUE CROSS/BLUE SHIELD | Admitting: Family Medicine

## 2017-08-19 ENCOUNTER — Ambulatory Visit (INDEPENDENT_AMBULATORY_CARE_PROVIDER_SITE_OTHER): Payer: BLUE CROSS/BLUE SHIELD | Admitting: Family Medicine

## 2017-08-19 ENCOUNTER — Encounter: Payer: Self-pay | Admitting: Family Medicine

## 2017-08-19 VITALS — BP 122/78 | HR 80 | Temp 98.2°F | Resp 16 | Ht 70.0 in | Wt 227.4 lb

## 2017-08-19 DIAGNOSIS — E669 Obesity, unspecified: Secondary | ICD-10-CM

## 2017-08-19 DIAGNOSIS — E1169 Type 2 diabetes mellitus with other specified complication: Secondary | ICD-10-CM | POA: Diagnosis not present

## 2017-08-19 DIAGNOSIS — I10 Essential (primary) hypertension: Secondary | ICD-10-CM | POA: Diagnosis not present

## 2017-08-19 DIAGNOSIS — E785 Hyperlipidemia, unspecified: Secondary | ICD-10-CM

## 2017-08-19 DIAGNOSIS — Z23 Encounter for immunization: Secondary | ICD-10-CM

## 2017-08-19 LAB — GLUCOSE, POCT (MANUAL RESULT ENTRY): POC GLUCOSE: 136 mg/dL — AB (ref 70–99)

## 2017-08-19 LAB — POCT GLYCOSYLATED HEMOGLOBIN (HGB A1C): HEMOGLOBIN A1C: 6.3

## 2017-08-19 MED ORDER — AMLODIPINE BESYLATE 10 MG PO TABS: 10.0000 mg | ORAL_TABLET | Freq: Every day | ORAL | 0 refills | Status: DC

## 2017-08-19 MED ORDER — ATORVASTATIN CALCIUM 40 MG PO TABS: 40.0000 mg | ORAL_TABLET | Freq: Every day | ORAL | 0 refills | Status: DC

## 2017-08-19 MED ORDER — METFORMIN HCL 1000 MG PO TABS
ORAL_TABLET | ORAL | 0 refills | Status: DC
Start: 2017-08-19 — End: 2017-11-25

## 2017-08-19 NOTE — Progress Notes (Signed)
Name: Andrew Sawyer   MRN: [id number]    DOB: [birth year]   Date:08/19/2017       Progress Note  Subjective  Chief Complaint  Chief Complaint  Patient presents with  . Follow-up    3 mo  . Diabetes    Diabetes  He presents for his follow-up diabetic visit. He has type 2 diabetes mellitus. His disease course has been stable. There are no hypoglycemic associated symptoms. Pertinent negatives for hypoglycemia include no headaches. Pertinent negatives for diabetes include no blurred vision, no chest pain, no fatigue, no polydipsia and no polyuria. Pertinent negatives for diabetic complications include no CVA. Current diabetic treatment includes oral agent (monotherapy). He is following a generally healthy diet. His breakfast blood glucose range is generally 110-130 mg/dl. An ACE inhibitor/angiotensin II receptor blocker is not being taken. Eye exam is not current.  Hypertension  This is a chronic problem. The problem is unchanged. The problem is controlled. Pertinent negatives include no blurred vision, chest pain, headaches, palpitations or shortness of breath. Past treatments include calcium channel blockers. There is no history of kidney disease, CAD/MI or CVA.  Hyperlipidemia  This is a chronic problem. The problem is controlled. Recent lipid tests were reviewed and are normal. Pertinent negatives include no chest pain, leg pain, myalgias or shortness of breath. Current antihyperlipidemic treatment includes statins (Takes Atorvasttain plus OTC Fish oil.).      Past Medical History:  Diagnosis Date  . Asthma, mild 03/30/2015  . Benign hypertension 03/30/2015  . Diabetes mellitus type 2 in obese (HCC) 03/30/2015  . Diabetes mellitus without complication (HCC)   . Hyperlipidemia   . Hypertension   . Situs inversus     Past Surgical History:  Procedure Laterality Date  . NASAL SINUS SURGERY    . RHINOPLASTY      Family History  Problem Relation Age of Onset  . Diabetes Mother   .  Hypertension Father   . Diabetes Brother   . Depression Paternal Grandmother   . Hypertension Paternal Grandfather   . Heart disease Paternal Grandfather   . Cancer Paternal Grandfather        Prostate    Social History   Social History  . Marital status: Married    Spouse name: N/A  . Number of children: N/A  . Years of education: N/A   Occupational History  . Not on file.   Social History Main Topics  . Smoking status: Never Smoker  . Smokeless tobacco: Never Used  . Alcohol use No  . Drug use: No  . Sexual activity: Not on file   Other Topics Concern  . Not on file   Social History Narrative  . No narrative on file     Current Outpatient Prescriptions:  .  amLODipine (NORVASC) 10 MG tablet, Take 1 tablet (10 mg total) by mouth daily., Disp: 90 tablet, Rfl: 0 .  aspirin 81 MG chewable tablet, Chew 81 mg by mouth daily., Disp: , Rfl:  .  atorvastatin (LIPITOR) 40 MG tablet, TAKE 1 TABLET BY MOUTH AT BEDTIME, Disp: 30 tablet, Rfl: 0 .  metFORMIN (GLUCOPHAGE) 1000 MG tablet, TAKE 1 TABLET BY MOUTH TWICE DAILY WITH A MEAL, Disp: 60 tablet, Rfl: 0 .  triamcinolone ointment (KENALOG) 0.5 %, Apply 1 application topically 2 (two) times daily., Disp: 30 g, Rfl: 0  No Known Allergies   Review of Systems  Constitutional: Negative for fatigue.  Eyes: Negative for blurred vision.  Respiratory: Negative for shortness  of breath.   Cardiovascular: Negative for chest pain and palpitations.  Musculoskeletal: Negative for myalgias.  Neurological: Negative for headaches.  Endo/Heme/Allergies: Negative for polydipsia.      Objective  Vitals:   08/19/17 0838  BP: 122/78  Pulse: 80  Resp: 16  Temp: 98.2 F (36.8 C)  TempSrc: Oral  SpO2: 96%  Weight: 227 lb 6.4 oz (103.1 kg)  Height: 5\' 10"  (1.778 m)    Physical Exam  Constitutional: He is oriented to person, place, and time and well-developed, well-nourished, and in no distress.  HENT:  Head: Normocephalic and  atraumatic.  Cardiovascular: Normal rate, regular rhythm and normal heart sounds.   No murmur heard. Pulmonary/Chest: Effort normal and breath sounds normal. He has no wheezes. He has no rales.  Abdominal: Soft. Bowel sounds are normal. There is no tenderness.  Musculoskeletal: He exhibits edema (1+ pitting edema RLE).  Neurological: He is alert and oriented to person, place, and time.  Psychiatric: Mood, memory, affect and judgment normal.  Nursing note and vitals reviewed.    Recent Results (from the past 2160 hour(s))  POCT Glucose (CBG)     Status: Abnormal   Collection Time: 08/19/17  8:43 AM  Result Value Ref Range   POC Glucose 136 (A) 70 - 99 mg/dl  POCT HgB Z6X     Status: Normal   Collection Time: 08/19/17  8:46 AM  Result Value Ref Range   Hemoglobin A1C 6.3      Assessment & Plan  1. Diabetes mellitus type 2 in obese (HCC) Point-of-care A1c 6.3%, well-controlled diabetes, continue on metformin - POCT HgB A1C - POCT Glucose (CBG) - metFORMIN (GLUCOPHAGE) 1000 MG tablet; TAKE 1 TABLET BY MOUTH TWICE DAILY WITH A MEAL  Dispense: 180 tablet; Refill: 0  2. Dyslipidemia Obtain FLP, continue on statin - atorvastatin (LIPITOR) 40 MG tablet; Take 1 tablet (40 mg total) by mouth at bedtime.  Dispense: 90 tablet; Refill: 0 - Lipid panel  3. Benign hypertension  - amLODipine (NORVASC) 10 MG tablet; Take 1 tablet (10 mg total) by mouth daily.  Dispense: 90 tablet; Refill: 0  4. Need for influenza vaccination  - Flu Vaccine QUAD 6+ mos PF IM (Fluarix Quad PF)  Juliana Boling [facility] 08/19/2017 8:54 AM

## 2017-08-20 LAB — LIPID PANEL
CHOL/HDL RATIO: 4 ratio (ref <5.0)
Cholesterol: 172 mg/dL (ref <200)
HDL: 43 mg/dL (ref >40)
LDL Cholesterol: 91 mg/dL (ref <100)
Triglycerides: 190 mg/dL — ABNORMAL HIGH (ref <150)
VLDL: 38 mg/dL — AB (ref <30)

## 2017-09-23 ENCOUNTER — Encounter: Payer: Self-pay | Admitting: Family Medicine

## 2017-09-23 ENCOUNTER — Ambulatory Visit (INDEPENDENT_AMBULATORY_CARE_PROVIDER_SITE_OTHER): Payer: BLUE CROSS/BLUE SHIELD | Admitting: Family Medicine

## 2017-09-23 VITALS — BP 124/78 | HR 73 | Ht 70.0 in | Wt 230.1 lb

## 2017-09-23 DIAGNOSIS — Z0000 Encounter for general adult medical examination without abnormal findings: Secondary | ICD-10-CM | POA: Diagnosis not present

## 2017-09-23 NOTE — Progress Notes (Signed)
Name: Andrew Sawyer   MRN: [id number]    DOB: [birth year]   Date:09/23/2017       Progress Note  Subjective  Chief Complaint  Chief Complaint  Patient presents with  . Annual Exam    HPI  Pt. Presents for complete physical exam. He has no family history of colon cancer, screening colonoscopy at age 41. His grandfather likely had prostate cancer, will order PSA.    Past Medical History:  Diagnosis Date  . Asthma, mild 03/30/2015  . Benign hypertension 03/30/2015  . Diabetes mellitus type 2 in obese (HCC) 03/30/2015  . Diabetes mellitus without complication (HCC)   . Hyperlipidemia   . Hypertension   . Situs inversus     Past Surgical History:  Procedure Laterality Date  . NASAL SINUS SURGERY    . RHINOPLASTY      Family History  Problem Relation Age of Onset  . Diabetes Mother   . Hypertension Father   . Diabetes Brother   . Depression Paternal Grandmother   . Hypertension Paternal Grandfather   . Heart disease Paternal Grandfather   . Cancer Paternal Grandfather        Prostate    Social History   Social History  . Marital status: Married    Spouse name: N/A  . Number of children: N/A  . Years of education: N/A   Occupational History  . Not on file.   Social History Main Topics  . Smoking status: Never Smoker  . Smokeless tobacco: Never Used  . Alcohol use No  . Drug use: No  . Sexual activity: Yes    Birth control/ protection: None, Condom   Other Topics Concern  . Not on file   Social History Narrative  . No narrative on file     Current Outpatient Prescriptions:  .  amLODipine (NORVASC) 10 MG tablet, Take 1 tablet (10 mg total) by mouth daily., Disp: 90 tablet, Rfl: 0 .  aspirin 81 MG chewable tablet, Chew 81 mg by mouth daily., Disp: , Rfl:  .  atorvastatin (LIPITOR) 40 MG tablet, Take 1 tablet (40 mg total) by mouth at bedtime., Disp: 90 tablet, Rfl: 0 .  metFORMIN (GLUCOPHAGE) 1000 MG tablet, TAKE 1 TABLET BY MOUTH TWICE DAILY WITH A MEAL,  Disp: 180 tablet, Rfl: 0 .  triamcinolone ointment (KENALOG) 0.5 %, Apply 1 application topically 2 (two) times daily., Disp: 30 g, Rfl: 0  No Known Allergies   Review of Systems  Constitutional: Negative for chills, fever and malaise/fatigue.  HENT: Positive for congestion (chronic sinus congestion) and sinus pain. Negative for ear pain, nosebleeds and sore throat.   Eyes: Negative for blurred vision and double vision.  Respiratory: Positive for cough (occasional coughing) and sputum production. Negative for shortness of breath.   Cardiovascular: Negative for chest pain, palpitations and leg swelling.  Gastrointestinal: Negative for abdominal pain, blood in stool, constipation, diarrhea, nausea and vomiting.  Genitourinary: Negative for dysuria and hematuria.  Musculoskeletal: Negative for back pain and neck pain.  Neurological: Negative for dizziness and headaches.  Psychiatric/Behavioral: Negative for depression. The patient is not nervous/anxious and does not have insomnia.      Objective  Vitals:   09/23/17 1023  BP: 124/78  Pulse: 73  SpO2: 94%  Weight: 230 lb 1.6 oz (104.4 kg)  Height:  (1.778 m)    Physical Exam  Constitutional: He is oriented to person, place, and time and well-developed, well-nourished, and in no distress.  HENT:  Head: Normocephalic and atraumatic.  Left Ear: External ear normal.  Right ear canal has whitish liquefied debris  Cardiovascular: Normal rate, regular rhythm, S1 normal, S2 normal and normal heart sounds.   No murmur heard. Pulmonary/Chest: Effort normal and breath sounds normal. He has no wheezes. He has no rhonchi.  Abdominal: Soft. Bowel sounds are normal. There is no tenderness.  Musculoskeletal: He exhibits no edema.       Right ankle: He exhibits no swelling.       Left ankle: He exhibits no swelling.  Neurological: He is alert and oriented to person, place, and time.  Psychiatric: Mood, memory, affect and judgment normal.   Nursing note and vitals reviewed.    Recent Results (from the past 2160 hour(s))  POCT Glucose (CBG)     Status: Abnormal   Collection Time: 08/19/17  8:43 AM  Result Value Ref Range   POC Glucose 136 (A) 70 - 99 mg/dl  POCT HgB F6O     Status: Normal   Collection Time: 08/19/17  8:46 AM  Result Value Ref Range   Hemoglobin A1C 6.3   Lipid panel     Status: Abnormal   Collection Time: 08/19/17  9:17 AM  Result Value Ref Range   Cholesterol 172 <200 mg/dL   Triglycerides 130 (H) <150 mg/dL   HDL 43 >86 mg/dL   Total CHOL/HDL Ratio 4.0 <5.0 Ratio   VLDL 38 (H) <30 mg/dL   LDL Cholesterol 91 <578 mg/dL     Assessment & Plan  1. Encounter for annual physical exam Obtain age-appropriate laboratory screenings - CBC with Differential/Platelet - TSH - VITAMIN D 25 Hydroxy (Vit-D Deficiency, Fractures) - PSA   Shawni Volkov [facility] 09/23/2017 10:42 AM

## 2017-09-24 LAB — CBC WITH DIFFERENTIAL/PLATELET
BASOS ABS: 24 {cells}/uL (ref 0–200)
Basophils Relative: 0.3 %
EOS PCT: 5.1 %
Eosinophils Absolute: 403 cells/uL (ref 15–500)
HEMATOCRIT: 45.3 % (ref 38.5–50.0)
Hemoglobin: 15.3 g/dL (ref 13.2–17.1)
LYMPHS ABS: 2386 {cells}/uL (ref 850–3900)
MCH: 28 pg (ref 27.0–33.0)
MCHC: 33.8 g/dL (ref 32.0–36.0)
MCV: 83 fL (ref 80.0–100.0)
MPV: 9.4 fL (ref 7.5–12.5)
Monocytes Relative: 8.8 %
NEUTROS PCT: 55.6 %
Neutro Abs: 4392 cells/uL (ref 1500–7800)
PLATELETS: 356 10*3/uL (ref 140–400)
RBC: 5.46 10*6/uL (ref 4.20–5.80)
RDW: 13.2 % (ref 11.0–15.0)
TOTAL LYMPHOCYTE: 30.2 %
WBC mixed population: 695 cells/uL (ref 200–950)
WBC: 7.9 10*3/uL (ref 3.8–10.8)

## 2017-09-24 LAB — TSH: TSH: 1.65 mIU/L (ref 0.40–4.50)

## 2017-09-24 LAB — VITAMIN D 25 HYDROXY (VIT D DEFICIENCY, FRACTURES): VIT D 25 HYDROXY: 20 ng/mL — AB (ref 30–100)

## 2017-09-24 LAB — PSA: PSA: 0.9 ng/mL (ref <=4.0)

## 2017-09-25 ENCOUNTER — Telehealth: Payer: Self-pay

## 2017-09-25 DIAGNOSIS — E559 Vitamin D deficiency, unspecified: Secondary | ICD-10-CM

## 2017-09-25 MED ORDER — VITAMIN D (ERGOCALCIFEROL) 1.25 MG (50000 UNIT) PO CAPS: 50000.0000 [IU] | ORAL_CAPSULE | ORAL | 0 refills | Status: DC

## 2017-09-25 NOTE — Telephone Encounter (Signed)
-----   Message from Ellyn Hack, MD sent at 09/24/2017  8:59 AM EDT ----- CBC shows normal white count, hemoglobin, hematocrit and platelets TSH is within normal range Vitamin D is below normal at 20 ng per mL, consistent with vitamin D deficiency. He should be started on vitamin D3 50,000 units 1 capsule weekly 12 weeks, recheck levels after completion of treatment PSA is within normal range

## 2017-09-25 NOTE — Telephone Encounter (Signed)
Called pt informed pt of all lab results. RX sent in for vitamin d supplement. Pt gave verbal understanding.

## 2017-11-24 ENCOUNTER — Other Ambulatory Visit: Payer: Self-pay

## 2017-11-24 DIAGNOSIS — E669 Obesity, unspecified: Principal | ICD-10-CM

## 2017-11-24 DIAGNOSIS — E1169 Type 2 diabetes mellitus with other specified complication: Secondary | ICD-10-CM

## 2017-11-25 ENCOUNTER — Ambulatory Visit (INDEPENDENT_AMBULATORY_CARE_PROVIDER_SITE_OTHER): Payer: BLUE CROSS/BLUE SHIELD | Admitting: Family Medicine

## 2017-11-25 ENCOUNTER — Encounter: Payer: Self-pay | Admitting: Family Medicine

## 2017-11-25 DIAGNOSIS — I10 Essential (primary) hypertension: Secondary | ICD-10-CM

## 2017-11-25 DIAGNOSIS — E785 Hyperlipidemia, unspecified: Secondary | ICD-10-CM

## 2017-11-25 DIAGNOSIS — E1169 Type 2 diabetes mellitus with other specified complication: Secondary | ICD-10-CM | POA: Diagnosis not present

## 2017-11-25 DIAGNOSIS — E669 Obesity, unspecified: Secondary | ICD-10-CM

## 2017-11-25 LAB — POCT GLYCOSYLATED HEMOGLOBIN (HGB A1C): HEMOGLOBIN A1C: 6.9

## 2017-11-25 LAB — GLUCOSE, POCT (MANUAL RESULT ENTRY): POC GLUCOSE: 140 mg/dL — AB (ref 70–99)

## 2017-11-25 MED ORDER — METFORMIN HCL 1000 MG PO TABS: ORAL_TABLET | ORAL | 0 refills | Status: DC

## 2017-11-25 MED ORDER — ATORVASTATIN CALCIUM 40 MG PO TABS: 40.0000 mg | ORAL_TABLET | Freq: Every day | ORAL | 0 refills | Status: DC

## 2017-11-25 MED ORDER — AMLODIPINE BESYLATE 10 MG PO TABS: 10.0000 mg | ORAL_TABLET | Freq: Every day | ORAL | 0 refills | Status: DC

## 2017-11-25 NOTE — Progress Notes (Signed)
Name: Andrew HarryJoseph A Sawyer   MRN: [id number]    DOB: [birth year]   Date:11/25/2017       Progress Note  Subjective  Chief Complaint  Chief Complaint  Patient presents with  . Hypertension    f/u  . Diabetes    f/u  . Hyperlipidemia    f/u  . Medication Refill    all maintence meds    Hypertension  This is a chronic problem. The problem is unchanged. The problem is controlled. Pertinent negatives include no blurred vision, chest pain, headaches, orthopnea, palpitations or shortness of breath. Past treatments include calcium channel blockers. There is no history of kidney disease, CAD/MI or CVA.  Diabetes  He presents for his follow-up diabetic visit. He has type 2 diabetes mellitus. His disease course has been stable. Pertinent negatives for hypoglycemia include no headaches. Pertinent negatives for diabetes include no blurred vision, no chest pain, no fatigue, no polyphagia and no polyuria. Pertinent negatives for diabetic complications include no CVA. Current diabetic treatment includes oral agent (monotherapy). He is following a diabetic diet. He monitors blood glucose at home 1-2 x per day. His breakfast blood glucose range is generally 110-130 mg/dl. An ACE inhibitor/angiotensin II receptor blocker is not being taken.  Hyperlipidemia  This is a chronic problem. The problem is controlled. Recent lipid tests were reviewed and are normal. Pertinent negatives include no chest pain, leg pain, myalgias or shortness of breath. Current antihyperlipidemic treatment includes statins.     Past Medical History:  Diagnosis Date  . Asthma, mild 03/30/2015  . Benign hypertension 03/30/2015  . Diabetes mellitus type 2 in obese (HCC) 03/30/2015  . Diabetes mellitus without complication (HCC)   . Hyperlipidemia   . Hypertension   . Situs inversus     Past Surgical History:  Procedure Laterality Date  . NASAL SINUS SURGERY    . RHINOPLASTY      Family History  Problem Relation Age of Onset  .  Diabetes Mother   . Hypertension Father   . Diabetes Brother   . Depression Paternal Grandmother   . Hypertension Paternal Grandfather   . Heart disease Paternal Grandfather   . Cancer Paternal Grandfather        Prostate    Social History   Socioeconomic History  . Marital status: Married    Spouse name: Not on file  . Number of children: Not on file  . Years of education: Not on file  . Highest education level: Not on file  Social Needs  . Financial resource strain: Not on file  . Food insecurity - worry: Not on file  . Food insecurity - inability: Not on file  . Transportation needs - medical: Not on file  . Transportation needs - non-medical: Not on file  Occupational History  . Not on file  Tobacco Use  . Smoking status: Never Smoker  . Smokeless tobacco: Never Used  Substance and Sexual Activity  . Alcohol use: No  . Drug use: No  . Sexual activity: Yes    Birth control/protection: None, Condom  Other Topics Concern  . Not on file  Social History Narrative  . Not on file     Current Outpatient Medications:  .  amLODipine (NORVASC) 10 MG tablet, Take 1 tablet (10 mg total) by mouth daily., Disp: 90 tablet, Rfl: 0 .  aspirin 81 MG chewable tablet, Chew 81 mg by mouth daily., Disp: , Rfl:  .  atorvastatin (LIPITOR) 40 MG tablet, Take 1 tablet (  40 mg total) by mouth at bedtime., Disp: 90 tablet, Rfl: 0 .  metFORMIN (GLUCOPHAGE) 1000 MG tablet, TAKE 1 TABLET BY MOUTH TWICE DAILY WITH A MEAL, Disp: 180 tablet, Rfl: 0 .  triamcinolone ointment (KENALOG) 0.5 %, Apply 1 application topically 2 (two) times daily., Disp: 30 g, Rfl: 0 .  Vitamin D, Ergocalciferol, (DRISDOL) 50000 units CAPS capsule, Take 1 capsule (50,000 Units total) by mouth every 7 (seven) days., Disp: 12 capsule, Rfl: 0 .  vitamin E 400 UNIT capsule, Take 1 capsule by mouth 2 (two) times daily., Disp: , Rfl:   No Known Allergies   Review of Systems  Constitutional: Negative for fatigue.  Eyes:  Negative for blurred vision.  Respiratory: Negative for shortness of breath.   Cardiovascular: Negative for chest pain, palpitations and orthopnea.  Musculoskeletal: Negative for myalgias.  Neurological: Negative for headaches.  Endo/Heme/Allergies: Negative for polyphagia.     Objective  Vitals:   11/25/17 0858  BP: 124/76  Pulse: 74  Resp: 14  Temp: 97.9 F (36.6 C)  TempSrc: Oral  SpO2: 97%  Weight: 232 lb 6.4 oz (105.4 kg)  Height: 5\' 10"  (1.778 m)    Physical Exam  Constitutional: He is oriented to person, place, and time and well-developed, well-nourished, and in no distress.  HENT:  Head: Normocephalic and atraumatic.  Cardiovascular: Normal rate, regular rhythm and normal heart sounds.  No murmur heard. Pulmonary/Chest: Effort normal and breath sounds normal. He has no wheezes.  Neurological: He is alert and oriented to person, place, and time.  Psychiatric: Mood, memory, affect and judgment normal.  Nursing note and vitals reviewed.      Assessment & Plan  1. Diabetes mellitus type 2 in obese (HCC) Point-of-care A1c 6.9%, well-controlled diabetes - POCT Glucose (CBG) - POCT HgB A1C - metFORMIN (GLUCOPHAGE) 1000 MG tablet; TAKE 1 TABLET BY MOUTH TWICE DAILY WITH A MEAL  Dispense: 180 tablet; Refill: 0  2. Benign hypertension  - amLODipine (NORVASC) 10 MG tablet; Take 1 tablet (10 mg total) by mouth daily.  Dispense: 90 tablet; Refill: 0  3. Dyslipidemia  - atorvastatin (LIPITOR) 40 MG tablet; Take 1 tablet (40 mg total) by mouth at bedtime.  Dispense: 90 tablet; Refill: 0    Andrew Sawyer [facility]ad [facility] 11/25/2017 9:29 AM

## 2017-12-13 ENCOUNTER — Telehealth: Payer: Self-pay | Admitting: Family Medicine

## 2017-12-13 NOTE — Telephone Encounter (Signed)
Gearldine BienenstockBrandy, RN from [employer] called to see if faxed papers where there at the office regarding Mr. Andrew Sawyer. She was reassured that they were.

## 2017-12-18 DIAGNOSIS — E119 Type 2 diabetes mellitus without complications: Secondary | ICD-10-CM | POA: Diagnosis not present

## 2017-12-18 DIAGNOSIS — H524 Presbyopia: Secondary | ICD-10-CM | POA: Diagnosis not present

## 2017-12-18 LAB — HM DIABETES EYE EXAM

## 2017-12-20 ENCOUNTER — Encounter: Payer: Self-pay | Admitting: Family Medicine

## 2018-01-08 ENCOUNTER — Telehealth: Payer: Self-pay | Admitting: Family Medicine

## 2018-01-08 NOTE — Telephone Encounter (Signed)
Tried Faxing over all the documents and for some reason its not going through we are getting a failed fax after I fax it couple of times as well as our referral coordinator and it still states failed fax. Called [employer] at number provided and left a message to see if I can get another fax number

## 2018-01-08 NOTE — Telephone Encounter (Signed)
Copied from CRM [phone]. Topic: Quick Communication - See Telephone Encounter >> Jan 08, 2018  1:22 PM Floria RavelingStovall, Shana A wrote: CRM for notification. 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  Can this be faxed again to  [phone]  01/08/18.

## 2018-01-09 ENCOUNTER — Telehealth: Payer: Self-pay

## 2018-01-09 NOTE — Telephone Encounter (Signed)
Copied from CRM [id number]. Topic: Quick Communication - See Telephone Encounter >> Jan 08, 2018  3:47 PM Delanna NoticeParrish, Amber Nichole, CMA wrote: CRM for notification. See Telephone encounter for:  01/08/18. >> Jan 08, 2018  4:05 PM Raquel SarnaHayes, Teresa G wrote: Pt said to hold on to the papers for his company.   He is coming by Monday morning, 1-21 to pick them up since the fax is having issues.

## 2018-01-09 NOTE — Telephone Encounter (Signed)
Form will be up front

## 2018-02-15 ENCOUNTER — Other Ambulatory Visit: Payer: Self-pay

## 2018-02-15 ENCOUNTER — Emergency Department: Payer: BLUE CROSS/BLUE SHIELD

## 2018-02-15 ENCOUNTER — Emergency Department
Admission: EM | Admit: 2018-02-15 | Discharge: 2018-02-16 | Disposition: A | Discharge status: Home / Self Care | Payer: BLUE CROSS/BLUE SHIELD | Attending: Emergency Medicine | Admitting: Emergency Medicine

## 2018-02-15 DIAGNOSIS — Z79899 Other long term (current) drug therapy: Secondary | ICD-10-CM | POA: Insufficient documentation

## 2018-02-15 DIAGNOSIS — Z7982 Long term (current) use of aspirin: Secondary | ICD-10-CM | POA: Insufficient documentation

## 2018-02-15 DIAGNOSIS — I10 Essential (primary) hypertension: Secondary | ICD-10-CM | POA: Diagnosis not present

## 2018-02-15 DIAGNOSIS — J45909 Unspecified asthma, uncomplicated: Secondary | ICD-10-CM | POA: Insufficient documentation

## 2018-02-15 DIAGNOSIS — J181 Lobar pneumonia, unspecified organism: Secondary | ICD-10-CM | POA: Diagnosis not present

## 2018-02-15 DIAGNOSIS — R05 Cough: Secondary | ICD-10-CM | POA: Diagnosis not present

## 2018-02-15 DIAGNOSIS — J189 Pneumonia, unspecified organism: Secondary | ICD-10-CM

## 2018-02-15 DIAGNOSIS — R0981 Nasal congestion: Secondary | ICD-10-CM | POA: Diagnosis present

## 2018-02-15 DIAGNOSIS — E119 Type 2 diabetes mellitus without complications: Secondary | ICD-10-CM | POA: Insufficient documentation

## 2018-02-15 LAB — INFLUENZA PANEL BY PCR (TYPE A & B)
INFLBPCR: NEGATIVE
Influenza A By PCR: NEGATIVE

## 2018-02-15 MED ORDER — AZITHROMYCIN 250 MG PO TABS
ORAL_TABLET | ORAL | 0 refills | Status: DC
Start: 2018-02-15 — End: 2018-03-02

## 2018-02-15 MED ORDER — BENZONATATE 100 MG PO CAPS: 100.0000 mg | ORAL_CAPSULE | Freq: Three times a day (TID) | ORAL | 0 refills | Status: DC | PRN

## 2018-02-15 MED ORDER — CEFTRIAXONE SODIUM 250 MG IJ SOLR
250.0000 mg | Freq: Once | INTRAMUSCULAR | Status: AC
  Administered 2018-02-15: 250 mg via INTRAMUSCULAR
  Filled 2018-02-15: qty 250

## 2018-02-15 MED ORDER — ONDANSETRON 4 MG PO TBDP: 4.0000 mg | ORAL_TABLET | Freq: Three times a day (TID) | ORAL | 0 refills | Status: DC | PRN

## 2018-02-15 MED ORDER — IPRATROPIUM-ALBUTEROL 0.5-2.5 (3) MG/3ML IN SOLN
3.0000 mL | Freq: Once | RESPIRATORY_TRACT | Status: AC
  Administered 2018-02-15: 3 mL via RESPIRATORY_TRACT
  Filled 2018-02-15: qty 3

## 2018-02-15 NOTE — ED Triage Notes (Signed)
Pt reports waking this am with cough, sore throat, body aches and headache; felt like he had a fever but never checked; ibuprofen taken at 10am;

## 2018-02-15 NOTE — Discharge Instructions (Signed)
You are being treated for a pneumonia of the left upper lobe. Take the antibiotic as directed. Take OTC Delsym for cough. Take Mucinex to promote productive coughing. Drink plenty of fluids to prevent dehydration. Follow-up with your provider or return for worsening symptoms.

## 2018-02-15 NOTE — ED Provider Notes (Signed)
[facility] Emergency Department Provider Note ____________________________________________  Time seen: 2244  I have reviewed the triage vital signs and the nursing notes.  HISTORY  Chief Complaint  Cough; Sore Throat; Generalized Body Aches; and Headache  HPI Andrew Sawyer is a 42 y.o. male presents to the ED accompanied by his spouse, for evaluation of sudden onset of sinus congestion, purulent nasal drainage, productive cough, and sore throat.  Patient also notes generalized body aches and sinus headache.  He describes a history of recurrent sinus infections and previous sinus surgeries.  He describes he is felt chilled and has had subjective fevers.  He took ibuprofen this morning at 10 AM.  Past Medical History:  Diagnosis Date  . Asthma, mild 03/30/2015  . Benign hypertension 03/30/2015  . Diabetes mellitus type 2 in obese (HCC) 03/30/2015  . Diabetes mellitus without complication (HCC)   . Hyperlipidemia   . Hypertension   . Situs inversus     Patient Active Problem List   Diagnosis Date Noted  . Hypertriglyceridemia 08/13/2016  . Dermatitis of lower extremity 01/23/2016  . Elevated liver enzymes 07/25/2015  . Asthma, mild 03/30/2015  . Diabetes mellitus type 2 in obese (HCC) 03/30/2015  . Routine general medical examination at a health care facility 03/30/2015  . Difficulty hearing 03/30/2015  . Brash 03/30/2015  . Dyslipidemia 03/30/2015  . Blood glucose elevated 03/30/2015  . Benign hypertension 03/30/2015  . Flu vaccine need 03/30/2015  . Adiposity 03/30/2015  . Pneumococcal vaccination given 03/30/2015  . Abnormal presence of protein in urine 03/30/2015    Past Surgical History:  Procedure Laterality Date  . NASAL SINUS SURGERY    . RHINOPLASTY      Prior to Admission medications   Medication Sig Start Date End Date Taking? Authorizing Provider  amLODipine (NORVASC) 10 MG tablet Take 1 tablet (10 mg total) by mouth daily. 11/25/17    Ellyn Hack, MD  aspirin 81 MG chewable tablet Chew 81 mg by mouth daily.    [provider]  atorvastatin (LIPITOR) 40 MG tablet Take 1 tablet (40 mg total) by mouth at bedtime. 11/25/17   Ellyn Hack, MD  azithromycin (ZITHROMAX Z-PAK) 250 MG tablet Take 2 tablets (500 mg) on  Day 1,  followed by 1 tablet (250 mg) once daily on Days 2 through 5. 02/15/18   Zeriah Baysinger, Charlesetta Ivory, PA-C  benzonatate (TESSALON PERLES) 100 MG capsule Take 1 capsule (100 mg total) by mouth 3 (three) times daily as needed for cough (Take 1-2 per dose). 02/15/18   Renald Haithcock, Charlesetta Ivory, PA-C  metFORMIN (GLUCOPHAGE) 1000 MG tablet TAKE 1 TABLET BY MOUTH TWICE DAILY WITH A MEAL 11/25/17   Ellyn Hack, MD  ondansetron (ZOFRAN ODT) 4 MG disintegrating tablet Take 1 tablet (4 mg total) by mouth every 8 (eight) hours as needed. 02/15/18   Rue Valladares, Charlesetta Ivory, PA-C  triamcinolone ointment (KENALOG) 0.5 % Apply 1 application topically 2 (two) times daily. 05/06/17   Doren Custard, FNP  Vitamin D, Ergocalciferol, (DRISDOL) 50000 units CAPS capsule Take 1 capsule (50,000 Units total) by mouth every 7 (seven) days. 09/25/17   Ellyn Hack, MD  vitamin E 400 UNIT capsule Take 1 capsule by mouth 2 (two) times daily.    [provider]    Allergies Patient has no known allergies.  Family History  Problem Relation Age of Onset  . Diabetes Mother   . Hypertension  Father   . Diabetes Brother   . Depression Paternal Grandmother   . Hypertension Paternal Grandfather   . Heart disease Paternal Grandfather   . Cancer Paternal Grandfather        Prostate    Social History Social History   Tobacco Use  . Smoking status: Never Smoker  . Smokeless tobacco: Never Used  Substance Use Topics  . Alcohol use: No  . Drug use: No    Review of Systems  Constitutional: Negative for fever. Eyes: Negative for visual changes. ENT: Negative for sore throat. Cardiovascular: Negative for chest  pain. Respiratory: Negative for shortness of breath. Gastrointestinal: Negative for abdominal pain, vomiting and diarrhea. Genitourinary: Negative for dysuria. Musculoskeletal: Negative for back pain. Skin: Negative for rash. Neurological: Negative for headaches, focal weakness or numbness. ____________________________________________  PHYSICAL EXAM:  VITAL SIGNS: ED Triage Vitals  Enc Vitals Group     BP 02/15/18 2202 (!) 151/98     Pulse Rate 02/15/18 2202 95     Resp 02/15/18 2202 20     Temp 02/15/18 2202 98.8 F (37.1 C)     Temp Source 02/15/18 2202 Oral     SpO2 02/15/18 2202 96 %     Weight 02/15/18 2214 230 lb (104.3 kg)     Height 02/15/18 2214 5\' 10"  (1.778 m)     Head Circumference --      Peak Flow --      Pain Score 02/15/18 2214 7     Pain Loc --      Pain Edu? --      Excl. in GC? --     Constitutional: Alert and oriented. Well appearing and in no distress. Head: Normocephalic and atraumatic. Eyes: Conjunctivae are normal. PERRL. Normal extraocular movements Ears: Canals clear. TMs intact bilaterally. Nose: No congestion/rhinorrhea/epistaxis. Purulent nasal mucous Mouth/Throat: Mucous membranes are moist. Uvula surgically absent. Erythematous oropharynx  Neck: Supple. No thyromegaly. Hematological/Lymphatic/Immunological: No cervical lymphadenopathy. Cardiovascular: Normal rate, regular rhythm. Normal distal pulses. Respiratory: Normal respiratory effort. No wheezes/rales. Bilateral rhonchi noted. Productive cough during exam Musculoskeletal: Nontender with normal range of motion in all extremities.  Neurologic:  Normal gait without ataxia. Normal speech and language. No gross focal neurologic deficits are appreciated. Skin:  Skin is warm, dry and intact. No rash noted. Psychiatric: Mood and affect are normal. Patient exhibits appropriate insight and judgment. ____________________________________________   LABS (pertinent positives/negatives)  Labs  Reviewed  INFLUENZA PANEL BY PCR (TYPE A & B)  ____________________________________________   RADIOLOGY  CXR  IMPRESSION: Mildly increased opacity within the lingula in area of known bronchiectasis likely representing superimposed infection/pneumonia.  Dextrocardia/situs inversus  I, Yaden Seith, Charlesetta IvoryJenise V Bacon, personally viewed and evaluated these images (plain radiographs) as part of my medical decision making, as well as reviewing the written report by the radiologist. ____________________________________________  PROCEDURES  Procedures Duoneb x 1 Rocephin 250 mg IM ____________________________________________  INITIAL IMPRESSION / ASSESSMENT AND PLAN / ED COURSE  Patient with ED evaluation of sinus congestion and productive cough.  He is been confirmed to have a left lingula pneumonia on chest x-ray today.  Patient treated with Rocephin IM in the ED and reports some improvement to his shortness of breath with a DuoNeb.  He will be discharged with a prescription for a azithromycin, Tessalon Perles, and Zofran.  He is encouraged to continue to monitor and treat any fevers as appropriate and follow with primary provider.  Return precautions have also been discussed with the patient and his  wife.  Work note is provided for 3 days at this time. ____________________________________________  FINAL CLINICAL IMPRESSION(S) / ED DIAGNOSES  Final diagnoses:  Community acquired pneumonia of left upper lobe of lung (HCC)      Karmen Stabs, Charlesetta Ivory, PA-C 02/16/18 0004    Emily Filbert, MD 02/18/18 [phone]

## 2018-03-02 ENCOUNTER — Other Ambulatory Visit: Payer: Self-pay | Admitting: Family Medicine

## 2018-03-02 ENCOUNTER — Encounter: Payer: Self-pay | Admitting: Family Medicine

## 2018-03-03 ENCOUNTER — Encounter: Payer: Self-pay | Admitting: Family Medicine

## 2018-03-03 ENCOUNTER — Ambulatory Visit (INDEPENDENT_AMBULATORY_CARE_PROVIDER_SITE_OTHER): Payer: BLUE CROSS/BLUE SHIELD | Admitting: Family Medicine

## 2018-03-03 VITALS — BP 130/80 | HR 76 | Resp 16 | Ht 70.0 in | Wt 230.6 lb

## 2018-03-03 DIAGNOSIS — Z114 Encounter for screening for human immunodeficiency virus [HIV]: Secondary | ICD-10-CM | POA: Diagnosis not present

## 2018-03-03 DIAGNOSIS — E559 Vitamin D deficiency, unspecified: Secondary | ICD-10-CM

## 2018-03-03 DIAGNOSIS — J479 Bronchiectasis, uncomplicated: Secondary | ICD-10-CM | POA: Diagnosis not present

## 2018-03-03 DIAGNOSIS — E1169 Type 2 diabetes mellitus with other specified complication: Secondary | ICD-10-CM

## 2018-03-03 DIAGNOSIS — Z23 Encounter for immunization: Secondary | ICD-10-CM | POA: Diagnosis not present

## 2018-03-03 DIAGNOSIS — Q893 Situs inversus: Secondary | ICD-10-CM | POA: Insufficient documentation

## 2018-03-03 DIAGNOSIS — E669 Obesity, unspecified: Secondary | ICD-10-CM

## 2018-03-03 DIAGNOSIS — J4520 Mild intermittent asthma, uncomplicated: Secondary | ICD-10-CM

## 2018-03-03 DIAGNOSIS — E785 Hyperlipidemia, unspecified: Secondary | ICD-10-CM

## 2018-03-03 DIAGNOSIS — R9431 Abnormal electrocardiogram [ECG] [EKG]: Secondary | ICD-10-CM

## 2018-03-03 DIAGNOSIS — I10 Essential (primary) hypertension: Secondary | ICD-10-CM | POA: Diagnosis not present

## 2018-03-03 LAB — POCT UA - MICROALBUMIN: Microalbumin Ur, POC: NEGATIVE mg/L

## 2018-03-03 LAB — POCT GLYCOSYLATED HEMOGLOBIN (HGB A1C): Hemoglobin A1C: 7

## 2018-03-03 MED ORDER — ALBUTEROL SULFATE HFA 108 (90 BASE) MCG/ACT IN AERS
2.0000 | INHALATION_SPRAY | Freq: Once | RESPIRATORY_TRACT | Status: DC
Start: 2018-03-03 — End: 2018-06-02

## 2018-03-03 MED ORDER — ATORVASTATIN CALCIUM 40 MG PO TABS: 40.0000 mg | ORAL_TABLET | Freq: Every day | ORAL | 0 refills | Status: DC

## 2018-03-03 MED ORDER — EMPAGLIFLOZIN-METFORMIN HCL ER 25-1000 MG PO TB24: 1.0000 | ORAL_TABLET | Freq: Every day | ORAL | 3 refills | Status: DC

## 2018-03-03 MED ORDER — AMLODIPINE BESY-BENAZEPRIL HCL 5-40 MG PO CAPS: 1.0000 | ORAL_CAPSULE | Freq: Every day | ORAL | 3 refills | Status: DC

## 2018-03-03 MED ORDER — VITAMIN D 50 MCG (2000 UT) PO CAPS: 1.0000 | ORAL_CAPSULE | Freq: Every day | ORAL | 3 refills | Status: AC

## 2018-03-03 NOTE — Progress Notes (Signed)
Name: Andrew Sawyer   MRN: [id number]    DOB: [birth year]   Date:03/03/2018       Progress Note  Subjective  Chief Complaint  Chief Complaint  Patient presents with  . Diabetes  . Hypertension  . Hyperlipidemia    HPI  DM2 Patient takes metformin 1000mg  once daily. Last A1C 6.9, today it is 7%. 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 Had proteinuria in the past- none today. Denies polyphagia, polyuria, polydipsia.   HTN Patient taking norvasc. States had a wellness check at work and noted that his blood pressure was higher. Denies chest pain, palpitations. Endorses mild transient lower extremity edema. He takes a daily aspirin.   HLD Takes Lipitor. Denies muscle aches.   07/2017 Lab Results  Component Value Date   CHOL 172 08/19/2017   HDL 43 08/19/2017   LDLCALC 91 08/19/2017   TRIG 190 (H) 08/19/2017   CHOLHDL 4.0 08/19/2017   Asthma Patient went to ER on 2/23 was dx with pneumonia but it has cleared up. Patient does not take any maintenance medications for asthma. Endorses occasional mild wheezing.     Vitamin D  Last checked on 09/23/2017  Was 20- discussed adding vitamin D supplement.   Abnormal CT Chest CT angio from 09/2016 noted: Situs inversus with chronic bronchiectasis (in the lingula). Impression corroborated with chest x-ray done on 01/2018.   Patient Active Problem List   Diagnosis Date Noted  . 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 Dyslipidemia associated with type 2 diabetes mellitus (HCC) 08/13/2016  . Dermatitis of lower extremity 01/23/2016  . Elevated liver enzymes 07/25/2015  . Asthma, mild 03/30/2015  . Diabetes mellitus type 2 in obese (HCC) 03/30/2015  . Difficulty hearing 03/30/2015  . Dyslipidemia 03/30/2015  . Benign hypertension 03/30/2015  . Adiposity 03/30/2015    Past Surgical History:  Procedure Laterality Date  . NASAL SINUS SURGERY    . RHINOPLASTY      Family History  Problem  Relation Age of Onset  . Diabetes Mother   . Hypertension Father   . Diabetes Brother   . Depression Paternal Grandmother   . Hypertension Paternal Grandfather   . Heart disease Paternal Grandfather   . Cancer Paternal Grandfather        Prostate    Social History   Socioeconomic History  . Marital status: Married    Spouse name: Not on file  . Number of children: Not on file  . Years of education: Not on file  . Highest education level: Not on file  Social Needs  . Financial resource strain: Not on file  . Food insecurity - worry: Not on file  . Food insecurity - inability: Not on file  . Transportation needs - medical: Not on file  . Transportation needs - non-medical: Not on file  Occupational History  . Not on file  Tobacco Use  . Smoking status: Never Smoker  . Smokeless tobacco: Never Used  Substance and Sexual Activity  . Alcohol use: No  . Drug use: No  . Sexual activity: Yes    Birth control/protection: None, Condom  Other Topics Concern  . Not on file  Social History Narrative  . Not on file     Current Outpatient Medications:  .  aspirin 81 MG chewable tablet, Chew 81 mg by mouth daily., Disp: , Rfl:  .  atorvastatin (LIPITOR) 40 MG tablet, Take 1 tablet (40 mg total)  by mouth at bedtime., Disp: 90 tablet, Rfl: 0 .  triamcinolone ointment (KENALOG) 0.5 %, Apply 1 application topically 2 (two) times daily., Disp: 30 g, Rfl: 0 .  vitamin E 400 UNIT capsule, Take 1 capsule by mouth 2 (two) times daily., Disp: , Rfl:  .  amLODipine-benazepril (LOTREL) 5-40 MG capsule, Take 1 capsule by mouth daily., Disp: 30 capsule, Rfl: 3 .  Cholecalciferol (VITAMIN D) 2000 units CAPS, Take 1 capsule (2,000 Units total) by mouth daily., Disp: 30 capsule, Rfl: 3 .  Empagliflozin-metFORMIN HCl ER (SYNJARDY XR) 25-1000 MG TB24, Take 1 tablet by mouth daily., Disp: 30 tablet, Rfl: 3 .  Vitamin D, Ergocalciferol, (DRISDOL) 50000 units CAPS capsule, Take 1 capsule (50,000 Units  total) by mouth every 7 (seven) days. (Patient not taking: Reported on 03/03/2018), Disp: 12 capsule, Rfl: 0  Current Facility-Administered Medications:  .  albuterol (PROVENTIL HFA;VENTOLIN HFA) 108 (90 Base) MCG/ACT inhaler 2 puff, 2 puff, Inhalation, Once, Governor Matos, Danna Hefty, MD  No Known Allergies  Review of Systems  Constitutional: Negative for fever and malaise/fatigue.  HENT: Negative for ear pain, sinus pain and sore throat.   Eyes: Negative for blurred vision.  Respiratory: Positive for wheezing (mild intermittent). Negative for cough and shortness of breath.   Cardiovascular: Positive for leg swelling (mild). Negative for chest pain and palpitations.  Gastrointestinal: Negative for abdominal pain, diarrhea, nausea and vomiting.  Genitourinary: Negative for dysuria, frequency and urgency.  Musculoskeletal: Negative for joint pain and myalgias.  Skin: Negative for rash.  Neurological: Negative for dizziness and headaches.    Objective  Vitals:   03/03/18 0826  BP: 130/80  Pulse: 76  Resp: 16  SpO2: 97%  Weight: 230 lb 9.6 oz (104.6 kg)  Height: 5\' 10"  (1.778 m)     Body mass index is 33.09 kg/m.   Physical Exam  Constitutional: He is oriented to person, place, and time and well-developed, well-nourished, and in no distress. No distress.  obese  HEENT:  Head: Normocephalic. Mouth/Throat: No oropharyngeal exudate. Eyes: Pupils are equal, round, and reactive to light. Neck: Normal range of motion. No thyromegaly present.  Cardiovascular: Normal rate, regular rhythm and intact distal pulses.  no peripheral edema Pulmonary/Chest: Effort normal.lung sounds clear- no wheezing.  Abdominal: Soft. Bowel sounds are normal.  Musculoskeletal: Normal range of motion.  Neurological: He is alert and oriented to person, place, and time.  Skin: Skin is warm and dry. No rash noted.  Psychiatric: Mood, memory, affect and judgment normal.   Results for orders placed or performed in  visit on 03/03/18 (from the past 72 hour(s))  POCT HgB A1C     Status: Abnormal   Collection Time: 03/03/18  8:50 AM  Result Value Ref Range   Hemoglobin A1C 7.0   POCT UA - Microalbumin     Status: Abnormal   Collection Time: 03/03/18  8:51 AM  Result Value Ref Range   Microalbumin Ur, POC Negative mg/L   Creatinine, POC  mg/dL   Albumin/Creatinine Ratio, Urine, POC      Diabetic Foot Exam: Diabetic Foot Exam - Simple   Simple Foot Form Diabetic Foot exam was performed with the following findings:  Yes 03/03/2018  9:39 AM  Visual Inspection No deformities, no ulcerations, no other skin breakdown bilaterally:  Yes Sensation Testing Intact to touch and monofilament testing bilaterally:  Yes Pulse Check Posterior Tibialis and Dorsalis pulse intact bilaterally:  Yes Comments     PHQ2/9: Depression screen [facility] 2/9 11/25/2017 09/23/2017  08/19/2017 02/04/2017 10/29/2016  Decreased Interest 0 0 0 0 0  Down, Depressed, Hopeless 0 0 0 0 0  PHQ - 2 Score 0 0 0 0 0     Fall Risk: Fall Risk  03/03/2018 03/03/2018 11/25/2017 09/23/2017 08/19/2017  Falls in the past year? No No No No No     Functional Status Survey: Is the patient deaf or have difficulty hearing?: No Does the patient have difficulty seeing, even when wearing glasses/contacts?: No Does the patient have difficulty concentrating, remembering, or making decisions?: No Does the patient have difficulty walking or climbing stairs?: No Does the patient have difficulty dressing or bathing?: No Does the patient have difficulty doing errands alone such as visiting a doctor's office or shopping?: No   Assessment & Plan  1. Diabetes mellitus type 2 in obese [facility](HCC) Discussed diet planning due to unhealthy options at truck stops. Including healthy snack options and water in truck cooler.  - POCT HgB A1C - POCT UA - Microalbumin - Empagliflozin-metFORMIN HCl ER (SYNJARDY XR) 25-1000 MG TB24; Take 1 tablet by mouth daily.  Dispense: 30  tablet; Refill: 3 - Pneumococcal conjugate vaccine 13-valent - COMPLETE METABOLIC PANEL WITH GFR  2. Dyslipidemia associated with type 2 diabetes mellitus (HCC) Discussed diet  - atorvastatin (LIPITOR) 40 MG tablet; Take 1 tablet (40 mg total) by mouth at bedtime.  Dispense: 90 tablet; Refill: 0  3. Benign hypertension Information given on DASH diet. Medication changed for increased blood pressure control and nephroprotection. Will monitor EKG and CMP for medication management and assessment of organ damage.  - amLODipine-benazepril (LOTREL) 5-40 MG capsule; Take 1 capsule by mouth daily.  Dispense: 30 capsule; Refill: 3 - COMPLETE METABOLIC PANEL WITH GFR - EKG [id number]-Lead  4. Encounter for screening for HIV Pt in monogomous relationship with wife- Discussed one time screening pt agreeable - HIV antibody  5. Mild intermittent asthma, unspecified whether complicated - albuterol inhaler - Ambulatory referral to Pulmonology - Pneumococcal conjugate vaccine 13-valent - Spirometry with Graph; Future - PR EVAL OF BRONCHOSPASM  6. Vitamin D deficiency Discussed importance of natural vitamin D from sun- and supplement daily till able to keep levels up.  - Cholecalciferol (VITAMIN D) 2000 units CAPS; Take 1 capsule (2,000 Units total) by mouth daily.  Dispense: 30 capsule; Refill: 3  7. Chronic bronchiectasis not affecting current episode of care [facility](HCC)  - Ambulatory referral to Pulmonology - Pneumococcal conjugate vaccine 13-valent - Spirometry with Graph; Future - PR EVAL OF BRONCHOSPASM - albuterol inhaler   8. Dyslipidemia  - atorvastatin (LIPITOR) 40 MG tablet; Take 1 tablet (40 mg total) by mouth at bedtime.  Dispense: 90 tablet; Refill: 0  9. Situs inversus   10. Abnormal EKG  Right bundle branch block, discussed results with patient, , offered referral to cardiologist or to monitor - since he has situs inversus and it may not be an accurate result. He chose the later.

## 2018-03-03 NOTE — Patient Instructions (Addendum)
We updated some of you medications to better control you diabetes, high blood pressure and protect your kidneys. We also want you to take some vitamin D daily and spend some more time out in the sun if possible now that it is warming up.   Diet for Metabolic Syndrome Metabolic syndrome is a disorder that includes at least three of these conditions:  Abdominal obesity.  Too much sugar in your blood.  High blood pressure.  Higher than normal amount of fat (lipids) in your blood.  Lower than normal level of "good" cholesterol (HDL).  Following a healthy diet can help to keep metabolic syndrome under control. It can also help to prevent the development of conditions that are associated with metabolic syndrome, such as diabetes, heart disease, and stroke. Along with exercise, a healthy diet:  Helps to improve the way that the body uses insulin.  Promotes weight loss. A common goal for people with this condition is to lose at least 7 to 10 percent of their starting weight.  What do I need to know about this diet?  Use the glycemic index (GI) to plan your meals. The index tells you how quickly a food will raise your blood sugar. Choose foods that have low GI values. These foods take a longer time to raise blood sugar.  Keep track of how many calories you take in. Eating the right amount of calories will help your achieve a healthy weight.  You may want to follow a Mediterranean diet. This diet includes lots of vegetables, lean meats or fish, whole grains, fruits, and healthy oils and fats. What foods can I eat? Grains Stone-ground whole wheat. Pumpernickel bread. Whole-grain bread, crackers, tortillas, cereal, and pasta. Unsweetened oatmeal.Bulgur.Barley.Quinoa.Brown rice or wild rice. Vegetables Lettuce. Spinach. Peas. Beets. Cauliflower. Cabbage. Broccoli. Carrots. Tomatoes. Squash. Eggplant. Herbs. Peppers. Onions. Cucumbers. Brussels sprouts. Sweet potatoes. Yams. Beans.  Lentils. Fruits Berries. Apples. Oranges. Grapes. Mango. Pomegranate. Kiwi. Cherries. Meats and Other Protein Sources Seafood and shellfish. Lean meats.Poultry. Tofu. Dairy Low-fat or fat-free dairy products, such as milk, yogurt, and cheese. Beverages Water. Low-fat milk. Milk alternatives, like soy milk or almond milk. Real fruit juice. Condiments Low-sugar or sugar-free ketchup, barbecue sauce, and mayonnaise. Mustard. Relish. Fats and Oils Avocado. Canola or olive oil. Nuts and nut butters.Seeds. The items listed above may not be a complete list of recommended foods or beverages. Contact your dietitian for more options. What foods are not recommended? Red meat. Palm oil and coconut oil. Processed foods. Fried foods. Alcohol. Sweetened drinks, such as iced tea and soda. Sweets. Salty foods. The items listed above may not be a complete list of foods and beverages to avoid. Contact your dietitian for more information. This information is not intended to replace advice given to you by your health care provider. Make sure you discuss any questions you have with your health care provider. Document Released: 04/26/2015 Document Revised: 04/20/2016 Document Reviewed: 12/22/2014 Elsevier Interactive Patient Education  2018 [employer] Inc[employer]   DASH Eating Plan DASH stands for "Dietary Approaches to Stop Hypertension." The DASH eating plan is a healthy eating plan that has been shown to reduce high blood pressure (hypertension). It may also reduce your risk for type 2 diabetes, heart disease, and stroke. The DASH eating plan may also help with weight loss. What are tips for following this plan? General guidelines  Avoid eating more than 2,300 mg (milligrams) of salt (sodium) a day. If you have hypertension, you may need to reduce your sodium  intake to 1,500 mg a day.  Limit alcohol intake to no more than 1 drink a day for nonpregnant women and 2 drinks a day for men. One drink equals 12 oz of  beer, 5 oz of wine, or 1 oz of hard liquor.  Work with your health care provider to maintain a healthy body weight or to lose weight. Ask what an ideal weight is for you.  Get at least 30 minutes of exercise that causes your heart to beat faster (aerobic exercise) most days of the week. Activities may include walking, swimming, or biking.  Work with your health care provider or diet and nutrition specialist (dietitian) to adjust your eating plan to your individual calorie needs. Reading food labels  Check food labels for the amount of sodium per serving. Choose foods with less than 5 percent of the Daily Value of sodium. Generally, foods with less than 300 mg of sodium per serving fit into this eating plan.  To find whole grains, look for the word "whole" as the first word in the ingredient list. Shopping  Buy products labeled as "low-sodium" or "no salt added."  Buy fresh foods. Avoid canned foods and premade or frozen meals. Cooking  Avoid adding salt when cooking. Use salt-free seasonings or herbs instead of table salt or sea salt. Check with your health care provider or pharmacist before using salt substitutes.  Do not fry foods. Cook foods using healthy methods such as baking, boiling, grilling, and broiling instead.  Cook with heart-healthy oils, such as olive, canola, soybean, or sunflower oil. Meal planning   Eat a balanced diet that includes: ? 5 or more servings of fruits and vegetables each day. At each meal, try to fill half of your plate with fruits and vegetables. ? Up to 6-8 servings of whole grains each day. ? Less than 6 oz of lean meat, poultry, or fish each day. A 3-oz serving of meat is about the same size as a deck of cards. One egg equals 1 oz. ? 2 servings of low-fat dairy each day. ? A serving of nuts, seeds, or beans 5 times each week. ? Heart-healthy fats. Healthy fats called Omega-3 fatty acids are found in foods such as flaxseeds and coldwater fish, like  sardines, salmon, and mackerel.  Limit how much you eat of the following: ? Canned or prepackaged foods. ? Food that is high in trans fat, such as fried foods. ? Food that is high in saturated fat, such as fatty meat. ? Sweets, desserts, sugary drinks, and other foods with added sugar. ? Full-fat dairy products.  Do not salt foods before eating.  Try to eat at least 2 vegetarian meals each week.  Eat more home-cooked food and less restaurant, buffet, and fast food.  When eating at a restaurant, ask that your food be prepared with less salt or no salt, if possible. What foods are recommended? The items listed may not be a complete list. Talk with your dietitian about what dietary choices are best for you. Grains Whole-grain or whole-wheat bread. Whole-grain or whole-wheat pasta. Brown rice. Orpah Cobb. Bulgur. Whole-grain and low-sodium cereals. Pita bread. Low-fat, low-sodium crackers. Whole-wheat flour tortillas. Vegetables Fresh or frozen vegetables (raw, steamed, roasted, or grilled). Low-sodium or reduced-sodium tomato and vegetable juice. Low-sodium or reduced-sodium tomato sauce and tomato paste. Low-sodium or reduced-sodium canned vegetables. Fruits All fresh, dried, or frozen fruit. Canned fruit in natural juice (without added sugar). Meat and other protein foods Skinless chicken or [place].  Ground chicken or [place]. Pork with fat trimmed off. Fish and seafood. Egg whites. Dried beans, peas, or lentils. Unsalted nuts, nut butters, and seeds. Unsalted canned beans. Lean cuts of beef with fat trimmed off. Low-sodium, lean deli meat. Dairy Low-fat (1%) or fat-free (skim) milk. Fat-free, low-fat, or reduced-fat cheeses. Nonfat, low-sodium ricotta or cottage cheese. Low-fat or nonfat yogurt. Low-fat, low-sodium cheese. Fats and oils Soft margarine without trans fats. Vegetable oil. Low-fat, reduced-fat, or light mayonnaise and salad dressings (reduced-sodium). Canola, safflower,  olive, soybean, and sunflower oils. Avocado. Seasoning and other foods Herbs. Spices. Seasoning mixes without salt. Unsalted popcorn and pretzels. Fat-free sweets. What foods are not recommended? The items listed may not be a complete list. Talk with your dietitian about what dietary choices are best for you. Grains Baked goods made with fat, such as croissants, muffins, or some breads. Dry pasta or rice meal packs. Vegetables Creamed or fried vegetables. Vegetables in a cheese sauce. Regular canned vegetables (not low-sodium or reduced-sodium). Regular canned tomato sauce and paste (not low-sodium or reduced-sodium). Regular tomato and vegetable juice (not low-sodium or reduced-sodium). Rosita Fire. Olives. Fruits Canned fruit in a light or heavy syrup. Fried fruit. Fruit in cream or butter sauce. Meat and other protein foods Fatty cuts of meat. Ribs. Fried meat. Tomasa Blase. Sausage. Bologna and other processed lunch meats. Salami. Fatback. Hotdogs. Bratwurst. Salted nuts and seeds. Canned beans with added salt. Canned or smoked fish. Whole eggs or egg yolks. Chicken or [place] with skin. Dairy Whole or 2% milk, cream, and half-and-half. Whole or full-fat cream cheese. Whole-fat or sweetened yogurt. Full-fat cheese. Nondairy creamers. Whipped toppings. Processed cheese and cheese spreads. Fats and oils Butter. Stick margarine. Lard. Shortening. Ghee. Bacon fat. Tropical oils, such as coconut, palm kernel, or palm oil. Seasoning and other foods Salted popcorn and pretzels. Onion salt, garlic salt, seasoned salt, table salt, and sea salt. Worcestershire sauce. Tartar sauce. Barbecue sauce. Teriyaki sauce. Soy sauce, including reduced-sodium. Steak sauce. Canned and packaged gravies. Fish sauce. Oyster sauce. Cocktail sauce. Horseradish that you find on the shelf. Ketchup. Mustard. Meat flavorings and tenderizers. Bouillon cubes. Hot sauce and Tabasco sauce. Premade or packaged marinades. Premade or packaged  taco seasonings. Relishes. Regular salad dressings. Where to find more information:  National Heart, Lung, and Blood Institute: PopSteam.is  American Heart Association: www.heart.org Summary  The DASH eating plan is a healthy eating plan that has been shown to reduce high blood pressure (hypertension). It may also reduce your risk for type 2 diabetes, heart disease, and stroke.  With the DASH eating plan, you should limit salt (sodium) intake to 2,300 mg a day. If you have hypertension, you may need to reduce your sodium intake to 1,500 mg a day.  When on the DASH eating plan, aim to eat more fresh fruits and vegetables, whole grains, lean proteins, low-fat dairy, and heart-healthy fats.  Work with your health care provider or diet and nutrition specialist (dietitian) to adjust your eating plan to your individual calorie needs. This information is not intended to replace advice given to you by your health care provider. Make sure you discuss any questions you have with your health care provider. Document Released: 11/29/2011 Document Revised: 12/03/2016 Document Reviewed: 12/03/2016 Elsevier Interactive Patient Education  [employer].

## 2018-03-04 LAB — COMPLETE METABOLIC PANEL WITH GFR
AG Ratio: 1.6 (calc) (ref 1.0–2.5)
ALT: 74 U/L — AB (ref 9–46)
AST: 45 U/L — AB (ref 10–40)
Albumin: 4.7 g/dL (ref 3.6–5.1)
Alkaline phosphatase (APISO): 81 U/L (ref 40–115)
BUN: 17 mg/dL (ref 7–25)
CALCIUM: 9.5 mg/dL (ref 8.6–10.3)
CO2: 26 mmol/L (ref 20–32)
CREATININE: 0.95 mg/dL (ref 0.60–1.35)
Chloride: 102 mmol/L (ref 98–110)
GFR, EST AFRICAN AMERICAN: 113 mL/min/{1.73_m2} (ref >=60)
GFR, EST NON AFRICAN AMERICAN: 98 mL/min/{1.73_m2} (ref >=60)
GLUCOSE: 145 mg/dL — AB (ref 65–99)
Globulin: 2.9 g/dL (calc) (ref 1.9–3.7)
POTASSIUM: 4.3 mmol/L (ref 3.5–5.3)
Sodium: 137 mmol/L (ref 135–146)
Total Bilirubin: 0.7 mg/dL (ref 0.2–1.2)
Total Protein: 7.6 g/dL (ref 6.1–8.1)

## 2018-03-04 LAB — HIV ANTIBODY (ROUTINE TESTING W REFLEX): HIV 1&2 Ab, 4th Generation: NONREACTIVE

## 2018-03-10 ENCOUNTER — Encounter: Payer: Self-pay | Admitting: Internal Medicine

## 2018-03-10 ENCOUNTER — Ambulatory Visit (INDEPENDENT_AMBULATORY_CARE_PROVIDER_SITE_OTHER): Payer: BLUE CROSS/BLUE SHIELD | Admitting: Internal Medicine

## 2018-03-10 VITALS — BP 118/70 | HR 68 | Resp 16 | Ht 70.0 in | Wt 229.0 lb

## 2018-03-10 DIAGNOSIS — J4520 Mild intermittent asthma, uncomplicated: Secondary | ICD-10-CM

## 2018-03-10 MED ORDER — ALBUTEROL SULFATE HFA 108 (90 BASE) MCG/ACT IN AERS: 2.0000 | INHALATION_SPRAY | Freq: Four times a day (QID) | RESPIRATORY_TRACT | 2 refills | Status: DC | PRN

## 2018-03-10 MED ORDER — FLUTICASONE FUROATE 100 MCG/ACT IN AEPB: 1.0000 | INHALATION_SPRAY | Freq: Every day | RESPIRATORY_TRACT | 2 refills | Status: AC

## 2018-03-10 NOTE — Progress Notes (Signed)
Name: Andrew Sawyer MRN: [id number] DOB: [birth year]     CONSULTATION DATE: 3.18.19 REFERRING MD : Poulose  CHIEF COMPLAINT: cough  STUDIES:  CXR independently reviewed -2.23.19 I have Independently reviewed images of Interpretation: RT lung opacity c/w pneumonia   HISTORY OF PRESENT ILLNESS: 42 year old pleasant white male seen today for chronic cough chronic congestion with wheezing Patient states he has dyspnea on exertion and shortness of breath for all his life Patient had recurrent bouts of bronchitis throughout his childhood and adulthood life patient had intermittent cough and wheezing and states that he has been diagnosed with asthma as as a child  Chest x-ray showed RT consistent with pneumonia patient was given antibiotics and states that he feels much better lung opacity  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 but he has had secondhand smoke exposure for all his life  At this time patient is not in any acute distress He does not have any signs of infection at this time No signs of heart failure at this time  Patient states he had allergy testing along time ago and he has no allergies noted Patient is exposed to cats dogs and carpet at home  Patient has never tried inhalers in the past No signs of acute asthma exacerbation at this time      PAST MEDICAL HISTORY :   has a past medical history of Abnormal presence of protein in urine (03/30/2015), Asthma, mild (03/30/2015), Benign hypertension (03/30/2015), Diabetes mellitus type 2 in obese (HCC) (03/30/2015), Diabetes mellitus without complication (HCC), Hyperlipidemia, Hypertension, and Situs inversus.  has a past surgical history that includes Rhinoplasty and Nasal sinus surgery. Prior to Admission medications   Medication Sig Start Date End Date Taking? Authorizing Provider  amLODipine-benazepril (LOTREL) 5-40 MG capsule Take 1 capsule by mouth daily. 03/03/18   Alba CorySowles, Krichna, MD  aspirin 81 MG chewable  tablet Chew 81 mg by mouth daily.    [provider]  atorvastatin (LIPITOR) 40 MG tablet Take 1 tablet (40 mg total) by mouth at bedtime. 03/03/18   Alba CorySowles, Krichna, MD  Cholecalciferol (VITAMIN D) 2000 units CAPS Take 1 capsule (2,000 Units total) by mouth daily. 03/03/18   Alba CorySowles, Krichna, MD  Empagliflozin-metFORMIN HCl ER (SYNJARDY XR) 25-1000 MG TB24 Take 1 tablet by mouth daily. 03/03/18   Alba CorySowles, Krichna, MD  triamcinolone ointment (KENALOG) 0.5 % Apply 1 application topically 2 (two) times daily. 05/06/17   Doren CustardBoyce, Emily E, FNP  Vitamin D, Ergocalciferol, (DRISDOL) 50000 units CAPS capsule Take 1 capsule (50,000 Units total) by mouth every 7 (seven) days. Patient not taking: Reported on 03/03/2018 09/25/17   Ellyn HackShah, Syed Asad A, MD  vitamin E 400 UNIT capsule Take 1 capsule by mouth 2 (two) times daily.    [provider]   No Known Allergies  FAMILY HISTORY:  family history includes Cancer in his paternal grandfather; Depression in his paternal grandmother; Diabetes in his brother and mother; Heart disease in his paternal grandfather; Hypertension in his father and paternal grandfather. SOCIAL HISTORY:  reports that  has never smoked. he has never used smokeless tobacco. He reports that he does not drink alcohol or use drugs.  REVIEW OF SYSTEMS:   Constitutional: Negative for fever, chills, weight loss, malaise/fatigue and diaphoresis.  HENT: Negative for hearing loss, ear pain, nosebleeds, congestion, sore throat, neck pain, tinnitus and ear discharge.   Eyes: Negative for blurred vision, double vision, photophobia, pain, discharge and redness.  Respiratory: + Cough,-hemoptysis,-sputum production, +  shortness of breath, + wheezing and-stridor.   Cardiovascular: Negative for chest pain, palpitations, orthopnea, claudication, leg swelling and PND.  Gastrointestinal: Negative for heartburn, nausea, vomiting, abdominal pain, diarrhea, constipation, blood in stool and melena.    Genitourinary: Negative for dysuria, urgency, frequency, hematuria and flank pain.  Musculoskeletal: Negative for myalgias, back pain, joint pain and falls.  Skin: Negative for itching and rash.  Neurological: Negative for dizziness, tingling, tremors, sensory change, speech change, focal weakness, seizures, loss of consciousness, weakness and headaches.  Endo/Heme/Allergies: Negative for environmental allergies and polydipsia. Does not bruise/bleed easily.  ALL OTHER ROS ARE NEGATIVE  BP 118/70 (BP Location: Left Arm, Cuff Size: Large)   Pulse 68   Resp 16   Ht 5\' 10"  (1.778 m)   Wt 229 lb (103.9 kg)   SpO2 97%   BMI 32.86 kg/m   Physical Examination:   GENERAL:NAD, no fevers, chills, no weakness no fatigue very poor dentition HEAD: Normocephalic, atraumatic.  EYES: Pupils equal, round, reactive to light. Extraocular muscles intact. No scleral icterus.  MOUTH: Moist mucosal membrane.   EAR, NOSE, THROAT: Clear without exudates. No external lesions.  NECK: Supple. No thyromegaly. No nodules. No JVD.  PULMONARY:CTA B/L no wheezes, no crackles, no rhonchi CARDIOVASCULAR: S1 and S2. Regular rate and rhythm. No murmurs, rubs, or gallops. No edema.  GASTROINTESTINAL: Soft, nontender, nondistended. No masses. Positive bowel sounds.  MUSCULOSKELETAL: No swelling, clubbing, or edema. Range of motion full in all extremities.  NEUROLOGIC: Cranial nerves II through XII are intact. No gross focal neurological deficits.  SKIN: No ulceration, lesions, rashes, or cyanosis. Skin warm and dry. Turgor intact.  PSYCHIATRIC: Mood, affect within normal limits. The patient is awake, alert and oriented x 3. Insight, judgment intact.      ASSESSMENT / PLAN: 42 year old pleasant white male seen today for shortness of breath wheezing with intermittent dyspnea on exertion and shortness of breath likely related to moderate persistent asthma in the setting of recent right lung pneumonia  At this time I  would recommend starting inhaled steroid with ARNUITY along with albuterol inhaler as needed I have also advised to avoid secondhand smoke exposure in allergens At this time patient does not show any signs and symptoms of allergic rhinitis  Would recommend starting inhaler therapy and to follow-up within 4-7 weeks for reassessment of his symptoms    Patient  satisfied with Plan of action and management. All questions answered  Lucie Leather, M.D.  [facility]  Medical Director [facility] Medical Director [facility] Cardio-Pulmonary Department

## 2018-03-10 NOTE — Patient Instructions (Signed)
Start ARNUITY INHALER Albuterol as needed Avoid SMOKE

## 2018-04-28 ENCOUNTER — Ambulatory Visit (INDEPENDENT_AMBULATORY_CARE_PROVIDER_SITE_OTHER): Payer: BLUE CROSS/BLUE SHIELD | Admitting: Internal Medicine

## 2018-04-28 ENCOUNTER — Encounter: Payer: Self-pay | Admitting: Internal Medicine

## 2018-04-28 VITALS — BP 140/80 | HR 86 | Ht 70.0 in | Wt 223.0 lb

## 2018-04-28 DIAGNOSIS — J4520 Mild intermittent asthma, uncomplicated: Secondary | ICD-10-CM | POA: Diagnosis not present

## 2018-04-28 MED ORDER — ARNUITY ELLIPTA 100 MCG/ACT IN AEPB: 1.0000 | INHALATION_SPRAY | Freq: Every day | RESPIRATORY_TRACT | 6 refills | Status: DC

## 2018-04-28 NOTE — Progress Notes (Signed)
   Name: Andrew Sawyer MRN: [id number] DOB: 12/07/1976     CONSULTATION DATE: 3.18.19 REFERRING MD : Poulose  CHIEF COMPLAINT: cough  STUDIES:  CXR independently reviewed -2.23.19 I have Independently reviewed images of Interpretation: RT lung opacity c/w pneumonia   HISTORY OF PRESENT ILLNESS: Patient has cough and intermittent wheezing but has improved with albuterol use No signs of acute asthma exacerbation at this time Patient does not know if Arnuity is helping but will continue as prescribed  No chest pain no fevers no chills no nausea vomiting diarrhea      REVIEW OF SYSTEMS:   Constitutional: Negative for fever, chills, weight loss, malaise/fatigue and diaphoresis.  HENT: Negative for hearing loss, ear pain, nosebleeds, congestion, sore throat, neck pain, tinnitus and ear discharge.   Eyes: Negative for blurred vision, double vision, photophobia, pain, discharge and redness.  Respiratory: + Cough,-hemoptysis,-sputum production, + shortness of breath, + wheezing and-stridor.   Cardiovascular: Negative for chest pain, palpitations, orthopnea, claudication, leg swelling and PND.   ALL OTHER ROS ARE NEGATIVE  BP 140/80 (BP Location: Left Arm, Cuff Size: Normal)   Pulse 86   Ht  (1.778 m)   Wt 223 lb (101.2 kg)   SpO2 97%   BMI 32.00 kg/m   Physical Examination:   GENERAL:NAD, no fevers, chills, no weakness no fatigue very poor dentition HEAD: Normocephalic, atraumatic.  EYES: Pupils equal, round, reactive to light. Extraocular muscles intact. No scleral icterus.  MOUTH: Moist mucosal membrane.   EAR, NOSE, THROAT: Clear without exudates. No external lesions.  NECK: Supple. No thyromegaly. No nodules. No JVD.  PULMONARY:CTA B/L no wheezes, no crackles, no rhonchi CARDIOVASCULAR: S1 and S2. Regular rate and rhythm. No murmurs, rubs, or gallops. No edema.  PSYCHIATRIC: Mood, affect within normal limits. The patient is awake, alert and oriented x 3.  Insight, judgment intact.      ASSESSMENT / PLAN: 42 year old pleasant white male seen today for shortness of breath wheezing with intermittent dyspnea on exertion and shortness of breath likely related to mild-moderate persistent asthma in the setting of recent right lung pneumonia   Continue albuterol as prescribed as needed Continue Arnuity inhaler as prescribed I have also advised to avoid secondhand smoke exposure in allergens At this time patient does not show any signs and symptoms of allergic rhinitis  Patient  satisfied with Plan of action and management. All questions answered Would follow-up in 6 months for reassessment will also get chest x-ray at that time   Lucie Leather, M.D.  [facility]  Medical Director [facility] Medical Director [facility] Cardio-Pulmonary Department

## 2018-04-28 NOTE — Addendum Note (Signed)
Addended by: Janean Sark on: 04/28/2018 10:20 AM   Modules accepted: Orders

## 2018-04-28 NOTE — Patient Instructions (Signed)
CONTINUE ARNUITY AS PRESCRIBED CONTINUE ALBUTEROL 2-4 puffs every 4-6 hrs as needed

## 2018-05-27 DIAGNOSIS — I10 Essential (primary) hypertension: Secondary | ICD-10-CM | POA: Diagnosis not present

## 2018-05-27 DIAGNOSIS — E119 Type 2 diabetes mellitus without complications: Secondary | ICD-10-CM | POA: Insufficient documentation

## 2018-05-27 DIAGNOSIS — J45909 Unspecified asthma, uncomplicated: Secondary | ICD-10-CM | POA: Insufficient documentation

## 2018-05-27 DIAGNOSIS — Z7984 Long term (current) use of oral hypoglycemic drugs: Secondary | ICD-10-CM | POA: Diagnosis not present

## 2018-05-27 DIAGNOSIS — K0889 Other specified disorders of teeth and supporting structures: Secondary | ICD-10-CM | POA: Diagnosis not present

## 2018-05-27 DIAGNOSIS — K029 Dental caries, unspecified: Secondary | ICD-10-CM | POA: Diagnosis not present

## 2018-05-27 DIAGNOSIS — Z79899 Other long term (current) drug therapy: Secondary | ICD-10-CM | POA: Diagnosis not present

## 2018-05-27 NOTE — ED Triage Notes (Signed)
Patient c/o left upper dental pain and swelling beginning this am.

## 2018-05-28 ENCOUNTER — Emergency Department
Admission: EM | Admit: 2018-05-28 | Discharge: 2018-05-28 | Disposition: A | Discharge status: Home / Self Care | Payer: BLUE CROSS/BLUE SHIELD | Attending: Emergency Medicine | Admitting: Emergency Medicine

## 2018-05-28 DIAGNOSIS — K0889 Other specified disorders of teeth and supporting structures: Secondary | ICD-10-CM

## 2018-05-28 DIAGNOSIS — K029 Dental caries, unspecified: Secondary | ICD-10-CM

## 2018-05-28 MED ORDER — IBUPROFEN 800 MG PO TABS: 800.0000 mg | ORAL_TABLET | Freq: Three times a day (TID) | ORAL | 0 refills | Status: DC | PRN

## 2018-05-28 MED ORDER — AMOXICILLIN-POT CLAVULANATE 875-125 MG PO TABS
1.0000 | ORAL_TABLET | Freq: Once | ORAL | Status: AC
  Administered 2018-05-28: 1 via ORAL
  Filled 2018-05-28: qty 1

## 2018-05-28 MED ORDER — TRAMADOL HCL 50 MG PO TABS
50.0000 mg | ORAL_TABLET | Freq: Once | ORAL | Status: AC
  Administered 2018-05-28: 50 mg via ORAL
  Filled 2018-05-28: qty 1

## 2018-05-28 MED ORDER — AMOXICILLIN-POT CLAVULANATE 875-125 MG PO TABS: 1.0000 | ORAL_TABLET | Freq: Two times a day (BID) | ORAL | 0 refills | Status: AC

## 2018-05-28 MED ORDER — LIDOCAINE VISCOUS HCL 2 % MT SOLN
15.0000 mL | Freq: Once | OROMUCOSAL | Status: AC
  Administered 2018-05-28: 15 mL via OROMUCOSAL
  Filled 2018-05-28: qty 15

## 2018-05-28 NOTE — ED Notes (Signed)
Reviewed discharge instructions, follow-up care, and prescriptions with patient. Patient verbalized understanding of all information reviewed. Patient stable, with no distress noted at this time.    

## 2018-05-28 NOTE — ED Provider Notes (Signed)
[facility] Emergency Department Provider Note   Time seen: 4:15 AM I have reviewed the triage vital signs and the nursing notes.   HISTORY  Chief Complaint Dental Pain    HPI Andrew Sawyer is a 42 y.o. male with below list of chronic medical conditions presents to the emergency department with dental pain x1 day.  Patient states current pain score is 8 out of 10.  Patient states pain is worse with any eating or drinking.  Patient denies any difficulty swallowing.  Patient denies any fever    Past Medical History:  Diagnosis Date  . Abnormal presence of protein in urine 03/30/2015  . Asthma, mild 03/30/2015  . Benign hypertension 03/30/2015  . Diabetes mellitus type 2 in obese (HCC) 03/30/2015  . Diabetes mellitus without complication (HCC)   . Hyperlipidemia   . Hypertension   . Situs inversus     Patient Active Problem List   Diagnosis Date Noted  . 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 Dyslipidemia associated with type 2 diabetes mellitus (HCC) 08/13/2016  . Dermatitis of lower extremity 01/23/2016  . Elevated liver enzymes 07/25/2015  . Asthma, mild 03/30/2015  . Diabetes mellitus type 2 in obese (HCC) 03/30/2015  . Difficulty hearing 03/30/2015  . Dyslipidemia 03/30/2015  . Benign hypertension 03/30/2015  . Adiposity 03/30/2015    Past Surgical History:  Procedure Laterality Date  . NASAL SINUS SURGERY    . RHINOPLASTY      Prior to Admission medications   Medication Sig Start Date End Date Taking? Authorizing Provider  albuterol (PROVENTIL HFA;VENTOLIN HFA) 108 (90 Base) MCG/ACT inhaler Inhale 2 puffs into the lungs every 6 (six) hours as needed for wheezing or shortness of breath. 03/10/18   Erin FullingKasa, Kurian, MD  amLODipine-benazepril (LOTREL) 5-40 MG capsule Take 1 capsule by mouth daily. 03/03/18   Sowles, Danna HeftyKrichna, MD  ARNUITY ELLIPTA 100 MCG/ACT AEPB Take 1 puff by mouth daily. 04/28/18   Erin FullingKasa, Kurian, MD  aspirin 81 MG chewable tablet Chew 81 mg  by mouth daily.    [provider]  atorvastatin (LIPITOR) 40 MG tablet Take 1 tablet (40 mg total) by mouth at bedtime. 03/03/18   Alba CorySowles, Krichna, MD  Cholecalciferol (VITAMIN D) 2000 units CAPS Take 1 capsule (2,000 Units total) by mouth daily. 03/03/18   Alba CorySowles, Krichna, MD  Empagliflozin-metFORMIN HCl ER (SYNJARDY XR) 25-1000 MG TB24 Take 1 tablet by mouth daily. 03/03/18   Alba CorySowles, Krichna, MD  triamcinolone ointment (KENALOG) 0.5 % Apply 1 application topically 2 (two) times daily. 05/06/17   Doren CustardBoyce, Emily E, FNP  Vitamin D, Ergocalciferol, (DRISDOL) 50000 units CAPS capsule Take 1 capsule (50,000 Units total) by mouth every 7 (seven) days. 09/25/17   Ellyn HackShah, Syed Asad A, MD  vitamin E 400 UNIT capsule Take 1 capsule by mouth 2 (two) times daily.    [provider]    Allergies No known drug allergies  Family History  Problem Relation Age of Onset  . Diabetes Mother   . Hypertension Father   . Diabetes Brother   . Depression Paternal Grandmother   . Hypertension Paternal Grandfather   . Heart disease Paternal Grandfather   . Cancer Paternal Grandfather        Prostate    Social History Social History   Tobacco Use  . Smoking status: Never Smoker  . Smokeless tobacco: Never Used  Substance Use Topics  . Alcohol use: No  . Drug use: No    Review of  Systems Constitutional: No fever/chills Eyes: No visual changes. ENT: No sore throat.  Positive for dental pain Cardiovascular: Denies chest pain. Respiratory: Denies shortness of breath. Gastrointestinal: No abdominal pain.  No nausea, no vomiting.  No diarrhea.  No constipation. Genitourinary: Negative for dysuria. Musculoskeletal: Negative for neck pain.  Negative for back pain. Integumentary: Negative for rash. Neurological: Negative for headaches, focal weakness or numbness.  ____________________________________________   PHYSICAL EXAM:  VITAL SIGNS: ED Triage Vitals [05/27/18 2311]  Enc Vitals Group      BP (!) 147/105     Pulse Rate 78     Resp 18     Temp 98.2 F (36.8 C)     Temp Source Oral     SpO2 97 %     Weight 102.1 kg (225 lb)     Height 1.778 m (5\' 10" )     Head Circumference      Peak Flow      Pain Score 10     Pain Loc      Pain Edu?      Excl. in GC?     Constitutional: Alert and oriented. Well appearing and in no acute distress. Eyes: Conjunctivae are normal.  Head: Atraumatic. Mouth/Throat: Mucous membranes are moist.  Oropharynx non-erythematous.  Multiple dental caries  Neck: No stridor.   Neurologic:  Normal speech and language. No gross focal neurologic deficits are appreciated.  Skin:  Skin is warm, dry and intact. No rash noted. Psychiatric: Mood and affect are normal. Speech and behavior are normal.  ____________________________________________     Procedures   ____________________________________________   INITIAL IMPRESSION / ASSESSMENT AND PLAN / ED COURSE  As part of my medical decision making, I reviewed the following data within the electronic MEDICAL RECORD NUMBER  42 year old male presenting with above-stated history and physical exam consistent with dental pain secondary to multiple dental caries.  Patient given Augmentin, viscous lidocaine and tramadol emergency department.  Patient will be prescribed Augmentin and ibuprofen for home ____________________________________________  FINAL CLINICAL IMPRESSION(S) / ED DIAGNOSES  Final diagnoses:  Dental caries  Pain, dental     MEDICATIONS GIVEN DURING THIS VISIT:  Medications  lidocaine (XYLOCAINE) 2 % viscous mouth solution 15 mL (has no administration in time range)  lidocaine (XYLOCAINE) 2 % viscous mouth solution 15 mL (15 mLs Mouth/Throat Given 05/28/18 0347)  traMADol (ULTRAM) tablet 50 mg (50 mg Oral Given 05/28/18 0347)  amoxicillin-clavulanate (AUGMENTIN) 875-125 MG per tablet 1 tablet (1 tablet Oral Given 05/28/18 0347)     ED Discharge Orders    None       Note:   This document was prepared using Dragon voice recognition software and may include unintentional dictation errors.    Darci Current, MD 05/28/18 [phone]

## 2018-05-28 NOTE — ED Notes (Signed)
MD Manson PasseyBrown in triage with this RN and patient

## 2018-06-02 ENCOUNTER — Encounter: Payer: Self-pay | Admitting: Family Medicine

## 2018-06-02 ENCOUNTER — Ambulatory Visit: Payer: BLUE CROSS/BLUE SHIELD | Admitting: Family Medicine

## 2018-06-02 VITALS — BP 112/64 | HR 80 | Temp 98.5°F | Resp 16 | Ht 70.0 in | Wt 222.4 lb

## 2018-06-02 DIAGNOSIS — E559 Vitamin D deficiency, unspecified: Secondary | ICD-10-CM

## 2018-06-02 DIAGNOSIS — E1169 Type 2 diabetes mellitus with other specified complication: Secondary | ICD-10-CM | POA: Diagnosis not present

## 2018-06-02 DIAGNOSIS — E785 Hyperlipidemia, unspecified: Secondary | ICD-10-CM | POA: Diagnosis not present

## 2018-06-02 DIAGNOSIS — E669 Obesity, unspecified: Secondary | ICD-10-CM | POA: Diagnosis not present

## 2018-06-02 DIAGNOSIS — I10 Essential (primary) hypertension: Secondary | ICD-10-CM | POA: Diagnosis not present

## 2018-06-02 DIAGNOSIS — K760 Fatty (change of) liver, not elsewhere classified: Secondary | ICD-10-CM

## 2018-06-02 LAB — POCT GLYCOSYLATED HEMOGLOBIN (HGB A1C): HbA1c, POC (prediabetic range): 6.4 % (ref 5.7–6.4)

## 2018-06-02 MED ORDER — ATORVASTATIN CALCIUM 40 MG PO TABS: 40.0000 mg | ORAL_TABLET | Freq: Every day | ORAL | 1 refills | Status: DC

## 2018-06-02 MED ORDER — AMLODIPINE BESY-BENAZEPRIL HCL 5-40 MG PO CAPS
1.0000 | ORAL_CAPSULE | Freq: Every day | ORAL | 1 refills | Status: DC
Start: 2018-06-02 — End: 2018-09-08

## 2018-06-02 MED ORDER — EMPAGLIFLOZIN-METFORMIN HCL ER 25-1000 MG PO TB24: 1.0000 | ORAL_TABLET | Freq: Every day | ORAL | 3 refills | Status: DC

## 2018-06-02 NOTE — Progress Notes (Signed)
Name: Andrew Sawyer   MRN: [id number]    DOB: Jan 11, 1976   Date:06/02/2018       Progress Note  Subjective  Chief Complaint  Chief Complaint  Patient presents with  . Medication Refill  . Diabetes    Checks daily, Average- 120-130 Highest-160  . Hyperlipidemia  . Hypertension    Denies any symptoms  . Asthma    HPI  DM2 Patient is off Metformin and now on Synjardi and hgbA1C is down from 7 % to 6.4 % with a mild weight loss. 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 Had proteinuria in the past- none today. Denies polyphagia, polyuria, polydipsia. He is on ACE now and is doing well.   HTN Patient taking Lotrel 5/40 for kidney protection and bp control , leg edema resolved with change in therapy Calculated aspirin use risk, and because of NNT for bleeding is 87 and prevention 252 we decided to stop supplementation   HLD Takes Lipitor. Denies muscle aches.  LDL goal is below 70, we will recheck labs next visit  07/2017 RecentLabs       Lab Results  Component Value Date   CHOL 172 08/19/2017   HDL 43 08/19/2017   LDLCALC 91 08/19/2017   TRIG 190 (H) 08/19/2017   CHOLHDL 4.0 08/19/2017     Asthma Patient went to ER on 02/15/2018 was dx with pneumonia but it has cleared up. He is now under the care of Dr. Belia Heman, taking Anuity daily and uses rescue inhaler for SOB about once a week.    Vitamin D  Last checked on 09/23/2017  Was 20- discussed adding vitamin D supplement. Unchanged, recheck labs next visit  Abnormal CT Chest CT angio from 09/2016 noted: Situs inversus with chronic bronchiectasis (in the lingula). Impression corroborated with chest x-ray done on 01/2018. He saw Dr. Belia Heman and has diagnosed of asthma, taking medication and feeling better.   Abscess Tooth: went to [facility] on 06/05, still taking Augmentin and will follow with dentist , taking ibuprofen to control pain  Patient Active Problem List   Diagnosis  Date Noted  . 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 Dyslipidemia associated with type 2 diabetes mellitus (HCC) 08/13/2016  . Dermatitis of lower extremity 01/23/2016  . Elevated liver enzymes 07/25/2015  . Asthma, mild 03/30/2015  . Diabetes mellitus type 2 in obese (HCC) 03/30/2015  . Difficulty hearing 03/30/2015  . Dyslipidemia 03/30/2015  . Benign hypertension 03/30/2015  . Adiposity 03/30/2015    Past Surgical History:  Procedure Laterality Date  . NASAL SINUS SURGERY    . RHINOPLASTY      Family History  Problem Relation Age of Onset  . Diabetes Mother   . Hypertension Father   . Diabetes Brother   . Depression Paternal Grandmother   . Hypertension Paternal Grandfather   . Heart disease Paternal Grandfather   . Cancer Paternal Grandfather        Prostate    Social History   Socioeconomic History  . Marital status: Married    Spouse name: Tobi Bastos  . Number of children: 0  . Years of education: Not on file  . Highest education level: 12th grade  Occupational History  . Not on file  Social Needs  . Financial resource strain: Not hard at all  . Food insecurity:    Worry: Never true    Inability: Never true  . Transportation needs:    Medical: No  Non-medical: No  Tobacco Use  . Smoking status: Never Smoker  . Smokeless tobacco: Never Used  Substance and Sexual Activity  . Alcohol use: No  . Drug use: No  . Sexual activity: Yes    Birth control/protection: None, Condom  Lifestyle  . Physical activity:    Days per week: 1 day    Minutes per session: 30 min  . Stress: Not at all  Relationships  . Social connections:    Talks on phone: Not on file    Gets together: Not on file    Attends religious service: Not on file    Active member of club or organization: Not on file    Attends meetings of clubs or organizations: Not on file    Relationship status: Not on file  . Intimate partner violence:    Fear of current or ex partner: No    Emotionally abused: No     Physically abused: No    Forced sexual activity: No  Other Topics Concern  . Not on file  Social History Narrative  . Not on file     Current Outpatient Medications:  .  albuterol (PROVENTIL HFA;VENTOLIN HFA) 108 (90 Base) MCG/ACT inhaler, Inhale 2 puffs into the lungs every 6 (six) hours as needed for wheezing or shortness of breath., Disp: 1 Inhaler, Rfl: 2 .  amLODipine-benazepril (LOTREL) 5-40 MG capsule, Take 1 capsule by mouth daily., Disp: 90 capsule, Rfl: 1 .  amoxicillin-clavulanate (AUGMENTIN) 875-125 MG tablet, Take 1 tablet by mouth 2 (two) times daily for 10 days., Disp: 20 tablet, Rfl: 0 .  ARNUITY ELLIPTA 100 MCG/ACT AEPB, Take 1 puff by mouth daily., Disp: 30 each, Rfl: 6 .  atorvastatin (LIPITOR) 40 MG tablet, Take 1 tablet (40 mg total) by mouth at bedtime., Disp: 90 tablet, Rfl: 1 .  Cholecalciferol (VITAMIN D) 2000 units CAPS, Take 1 capsule (2,000 Units total) by mouth daily., Disp: 30 capsule, Rfl: 3 .  Empagliflozin-metFORMIN HCl ER (SYNJARDY XR) 25-1000 MG TB24, Take 1 tablet by mouth daily., Disp: 30 tablet, Rfl: 3 .  ibuprofen (ADVIL,MOTRIN) 800 MG tablet, Take 1 tablet (800 mg total) by mouth every 8 (eight) hours as needed., Disp: 30 tablet, Rfl: 0 .  triamcinolone ointment (KENALOG) 0.5 %, Apply 1 application topically 2 (two) times daily., Disp: 30 g, Rfl: 0 .  vitamin E 400 UNIT capsule, Take 1 capsule by mouth 2 (two) times daily., Disp: , Rfl:   No Known Allergies   ROS  Constitutional: Negative for fever or weight change.  Respiratory: Negative for cough but has occasional shortness of breath.   Cardiovascular: Negative for chest pain or palpitations.  Gastrointestinal: Negative for abdominal pain, no bowel changes.  Musculoskeletal: Negative for gait problem or joint swelling.  Skin: Negative for rash.  Neurological: Negative for dizziness or headache.  No other specific complaints in a complete review of systems (except as listed in HPI  above).  Objective  Vitals:   06/02/18 1018  BP: 112/64  Pulse: 80  Resp: 16  Temp: 98.5 F (36.9 C)  TempSrc: Oral  SpO2: 98%  Weight: 222 lb 6.4 oz (100.9 kg)  Height: 5\' 10"  (1.778 m)    Body mass index is 31.91 kg/m.  Physical Exam  Constitutional: Patient appears well-developed and well-nourished. Obese  No distress.  HEENT: head atraumatic, normocephalic, pupils equal and reactive to light,  neck supple, throat within normal limits Cardiovascular: Normal rate, regular rhythm and normal heart sounds.  No  murmur heard. No BLE edema. Pulmonary/Chest: Effort normal and breath sounds normal. No respiratory distress. Abdominal: Soft.  There is no tenderness. Psychiatric: Patient has a normal mood and affect. behavior is normal. Judgment and thought content normal.  Recent Results (from the past 2160 hour(s))  POCT HgB A1C     Status: Normal   Collection Time: 06/02/18 10:23 AM  Result Value Ref Range   Hemoglobin A1C  4.0 - 5.6 %   HbA1c, POC (prediabetic range) 6.4 5.7 - 6.4 %   HbA1c, POC (controlled diabetic range)  0.0 - 7.0 %    D  PHQ2/9: [facility] 06/02/2018 11/25/2017 09/23/2017 08/19/2017 02/04/2017  Decreased Interest 0 0 0 0 0  Down, Depressed, Hopeless 0 0 0 0 0  PHQ - 2 Score 0 0 0 0 0     Fall Risk: Fall Risk  06/02/2018 03/03/2018 03/03/2018 11/25/2017 09/23/2017  Falls in the past year? No No No No No    Functional Status Survey: Is the patient deaf or have difficulty hearing?: No Does the patient have difficulty seeing, even when wearing glasses/contacts?: No Does the patient have difficulty concentrating, remembering, or making decisions?: No Does the patient have difficulty walking or climbing stairs?: No Does the patient have difficulty dressing or bathing?: No Does the patient have difficulty doing errands alone such as visiting a doctor's office or shopping?: No    Assessment & Plan  1. Diabetes mellitus type 2 in obese  (HCC)  - POCT HgB A1C - Empagliflozin-metFORMIN HCl ER (SYNJARDY XR) 25-1000 MG TB24; Take 1 tablet by mouth daily.  Dispense: 30 tablet; Refill: 3  2. Benign hypertension  - amLODipine-benazepril (LOTREL) 5-40 MG capsule; Take 1 capsule by mouth daily.  Dispense: 90 capsule; Refill: 1  3. Dyslipidemia  - atorvastatin (LIPITOR) 40 MG tablet; Take 1 tablet (40 mg total) by mouth at bedtime.  Dispense: 90 tablet; Refill: 1  4. Dyslipidemia associated with type 2 diabetes mellitus (HCC)  - atorvastatin (LIPITOR) 40 MG tablet; Take 1 tablet (40 mg total) by mouth at bedtime.  Dispense: 90 tablet; Refill: 1  5. Vitamin D deficiency  Continue otc supplements  6. Fatty liver  Seen by GI in the past, on vitamin E per GI recommendation.  Biopsy showed Grade 1 -stage 1

## 2018-09-08 ENCOUNTER — Encounter: Payer: Self-pay | Admitting: Family Medicine

## 2018-09-08 ENCOUNTER — Ambulatory Visit: Payer: BLUE CROSS/BLUE SHIELD | Admitting: Family Medicine

## 2018-09-08 VITALS — BP 106/70 | HR 70 | Temp 98.1°F | Resp 16 | Ht 70.0 in | Wt 223.8 lb

## 2018-09-08 DIAGNOSIS — I10 Essential (primary) hypertension: Secondary | ICD-10-CM

## 2018-09-08 DIAGNOSIS — E1169 Type 2 diabetes mellitus with other specified complication: Secondary | ICD-10-CM

## 2018-09-08 DIAGNOSIS — Q893 Situs inversus: Secondary | ICD-10-CM

## 2018-09-08 DIAGNOSIS — E559 Vitamin D deficiency, unspecified: Secondary | ICD-10-CM | POA: Diagnosis not present

## 2018-09-08 DIAGNOSIS — Z23 Encounter for immunization: Secondary | ICD-10-CM | POA: Diagnosis not present

## 2018-09-08 DIAGNOSIS — Z79899 Other long term (current) drug therapy: Secondary | ICD-10-CM

## 2018-09-08 DIAGNOSIS — E785 Hyperlipidemia, unspecified: Secondary | ICD-10-CM | POA: Diagnosis not present

## 2018-09-08 DIAGNOSIS — K760 Fatty (change of) liver, not elsewhere classified: Secondary | ICD-10-CM

## 2018-09-08 DIAGNOSIS — E669 Obesity, unspecified: Secondary | ICD-10-CM

## 2018-09-08 MED ORDER — ATORVASTATIN CALCIUM 40 MG PO TABS: 40.0000 mg | ORAL_TABLET | Freq: Every day | ORAL | 1 refills | Status: DC

## 2018-09-08 MED ORDER — AMLODIPINE BESY-BENAZEPRIL HCL 5-40 MG PO CAPS: 1.0000 | ORAL_CAPSULE | Freq: Every day | ORAL | 1 refills | Status: DC

## 2018-09-08 MED ORDER — EMPAGLIFLOZIN-METFORMIN HCL ER 25-1000 MG PO TB24: 1.0000 | ORAL_TABLET | Freq: Every day | ORAL | 1 refills | Status: DC

## 2018-09-08 NOTE — Progress Notes (Signed)
Name: Andrew Sawyer   MRN: [id number]    DOB: 05-02-1976   Date:09/08/2018       Progress Note  Subjective  Chief Complaint  Chief Complaint  Patient presents with  . Medication Refill  . Diabetes    Checks daily Average-120 Highest-150  . Hypertension    Denies any symptoms  . Hyperlipidemia  . 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 Had proteinuria in the past- none today. Denies polyphagia, polyuria, polydipsia. He is on ACE now and is doing well.   HTN Patient taking Lotrel 5/40 for kidney protection and bp control . BP is towards low end of normal today, but recent bp at Dr. Belia Heman was elevated and we will continue current dose of medication   HLD Takes Lipitor. Denies muscle aches.LDL goal is below 70, we will recheck labs today 07/2017 RecentLabs       Lab Results  Component Value Date   CHOL 172 08/19/2017   HDL 43 08/19/2017   LDLCALC 91 08/19/2017   TRIG 190 (H) 08/19/2017   CHOLHDL 4.0 08/19/2017     Asthma He is under the care of Dr. Belia Heman, taking medication as prescribed, no wheezing, cough or SOB at this time  Vitamin D  Last checked on 09/23/2017 Was 20- discussed adding vitamin D supplement. Recheck lab today   Abnormal CT Chest  CT angio from 09/2016 noted:Situs inversus with chronic bronchiectasis(in the lingula).Impression corroborated with chest x-ray done on 01/2018.He saw Dr. Belia Heman and has diagnosed of asthma, taking medication and feeling better. Unchanged    Morbid obesity He has DM, HTN and dyslipidemia with BMI above 30, discussed healthy snacks, and try to be more active when off work   Patient Active Problem List   Diagnosis Date Noted  . 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 Dyslipidemia associated  with type 2 diabetes mellitus (HCC) 08/13/2016  . Dermatitis of lower extremity 01/23/2016  . Elevated liver enzymes 07/25/2015  . Asthma, mild 03/30/2015  . Diabetes mellitus type 2 in obese (HCC) 03/30/2015  . Difficulty hearing 03/30/2015  . Dyslipidemia 03/30/2015  . Benign hypertension 03/30/2015  . Morbid obesity (HCC) 03/30/2015    Past Surgical History:  Procedure Laterality Date  . NASAL SINUS SURGERY    . RHINOPLASTY      Family History  Problem Relation Age of Onset  . Diabetes Mother   . Hypertension Father   . Diabetes Brother   . Depression Paternal Grandmother   . Hypertension Paternal Grandfather   . Heart disease Paternal Grandfather   . Cancer Paternal Grandfather        Prostate    Social History   Socioeconomic History  . Marital status: Married    Spouse name: Tobi Bastos  . Number of children: 0  . Years of education: Not on file  . Highest education level: 12th grade  Occupational History  . Occupation: truck Public librarian Needs  . Financial resource strain: Not hard at all  . Food insecurity:    Worry: Never true    Inability: Never true  . Transportation needs:    Medical: No    Non-medical: No  Tobacco Use  .  Smoking status: Never Smoker  . Smokeless tobacco: Never Used  Substance and Sexual Activity  . Alcohol use: No  . Drug use: No  . Sexual activity: Yes    Birth control/protection: None, Condom  Lifestyle  . Physical activity:    Days per week: 1 day    Minutes per session: 30 min  . Stress: Not at all  Relationships  . Social connections:    Talks on phone: Not on file    Gets together: Not on file    Attends religious service: Not on file    Active member of club or organization: Not on file    Attends meetings of clubs or organizations: Not on file    Relationship status: Not on file  . Intimate partner violence:    Fear of current or ex partner: No    Emotionally abused: No    Physically abused: No    Forced sexual  activity: No  Other Topics Concern  . Not on file  Social History Narrative  . Not on file     Current Outpatient Medications:  .  albuterol (PROVENTIL HFA;VENTOLIN HFA) 108 (90 Base) MCG/ACT inhaler, Inhale 2 puffs into the lungs every 6 (six) hours as needed for wheezing or shortness of breath., Disp: 1 Inhaler, Rfl: 2 .  amLODipine-benazepril (LOTREL) 5-40 MG capsule, Take 1 capsule by mouth daily., Disp: 90 capsule, Rfl: 1 .  ARNUITY ELLIPTA 100 MCG/ACT AEPB, Take 1 puff by mouth daily., Disp: 30 each, Rfl: 6 .  atorvastatin (LIPITOR) 40 MG tablet, Take 1 tablet (40 mg total) by mouth at bedtime., Disp: 90 tablet, Rfl: 1 .  Cholecalciferol (VITAMIN D) 2000 units CAPS, Take 1 capsule (2,000 Units total) by mouth daily., Disp: 30 capsule, Rfl: 3 .  Empagliflozin-metFORMIN HCl ER (SYNJARDY XR) 25-1000 MG TB24, Take 1 tablet by mouth daily., Disp: 90 tablet, Rfl: 1 .  ibuprofen (ADVIL,MOTRIN) 800 MG tablet, Take 1 tablet (800 mg total) by mouth every 8 (eight) hours as needed., Disp: 30 tablet, Rfl: 0 .  triamcinolone ointment (KENALOG) 0.5 %, Apply 1 application topically 2 (two) times daily., Disp: 30 g, Rfl: 0 .  vitamin E 400 UNIT capsule, Take 1 capsule by mouth 2 (two) times daily., Disp: , Rfl:   No Known Allergies  I personally reviewed active problem list, medication list, allergies, family history, social history with the patient/caregiver today.   ROS  Constitutional: Negative for fever or weight change.  Respiratory: Negative for cough and shortness of breath.   Cardiovascular: Negative for chest pain or palpitations.  Gastrointestinal: Negative for abdominal pain, no bowel changes.  Musculoskeletal: Negative for gait problem or joint swelling.  Skin: Negative for rash.  Neurological: Negative for dizziness or headache.  No other specific complaints in a complete review of systems (except as listed in HPI above).  Objective  Vitals:   09/08/18 0818  BP: 106/70   Pulse: 70  Resp: 16  Temp: 98.1 F (36.7 C)  TempSrc: Oral  SpO2: 96%  Weight: 223 lb 12.8 oz (101.5 kg)  Height: 5\' 10"  (1.778 m)    Body mass index is 32.11 kg/m.  Physical Exam  Constitutional: Patient appears well-developed and well-nourished. Obese No distress.  HEENT: head atraumatic, normocephalic, pupils equal and reactive to light, neck supple, throat within normal limits Cardiovascular: Normal rate, regular rhythm and normal heart sounds.  No murmur heard. No BLE edema. Pulmonary/Chest: Effort normal and breath sounds normal. No respiratory distress. Abdominal: Soft.  There is no tenderness. Psychiatric: Patient has a normal mood and affect. behavior is normal. Judgment and thought content normal.  PHQ2/9: Depression screen [facility] 2/9 09/08/2018 06/02/2018 11/25/2017 09/23/2017 08/19/2017  Decreased Interest 0 0 0 0 0  Down, Depressed, Hopeless 0 0 0 0 0  PHQ - 2 Score 0 0 0 0 0     Fall Risk: Fall Risk  09/08/2018 06/02/2018 03/03/2018 03/03/2018 11/25/2017  Falls in the past year? No No No No No     Functional Status Survey: Is the patient deaf or have difficulty hearing?: No Does the patient have difficulty seeing, even when wearing glasses/contacts?: No Does the patient have difficulty concentrating, remembering, or making decisions?: No Does the patient have difficulty walking or climbing stairs?: No Does the patient have difficulty dressing or bathing?: No Does the patient have difficulty doing errands alone such as visiting a doctor's office or shopping?: No    Assessment & Plan   1. Diabetes mellitus type 2 in obese (HCC)  - Empagliflozin-metFORMIN HCl ER (SYNJARDY XR) 25-1000 MG TB24; Take 1 tablet by mouth daily.  Dispense: 90 tablet; Refill: 1 - Hemoglobin A1c - Urine Microalbumin w/creat. ratio  2. Need for immunization against influenza  - Flu Vaccine QUAD 6+ mos PF IM (Fluarix Quad PF)  3. Dyslipidemia  - atorvastatin (LIPITOR) 40 MG tablet;  Take 1 tablet (40 mg total) by mouth at bedtime.  Dispense: 90 tablet; Refill: 1 - Lipid panel  4. Dyslipidemia associated with type 2 diabetes mellitus (HCC)  - atorvastatin (LIPITOR) 40 MG tablet; Take 1 tablet (40 mg total) by mouth at bedtime.  Dispense: 90 tablet; Refill: 1 - Hemoglobin A1c - Urine Microalbumin w/creat. ratio  5. Benign hypertension  - amLODipine-benazepril (LOTREL) 5-40 MG capsule; Take 1 capsule by mouth daily.  Dispense: 90 capsule; Refill: 1 - CBC with Differential/Platelet - COMPLETE METABOLIC PANEL WITH GFR  6. Situs inversus   7. Vitamin D deficiency  - VITAMIN D 25 Hydroxy (Vit-D Deficiency, Fractures)  8. Fatty liver  Recheck labs  9. Long-term use of high-risk medication  - Vitamin B12  10. Morbid obesity (HCC)  Discussed with the patient the risk posed by an increased BMI. Discussed importance of portion control, calorie counting and at least 150 minutes of physical activity weekly. Avoid sweet beverages and drink more water. Eat at least 6 servings of fruit and vegetables daily

## 2018-09-09 LAB — CBC WITH DIFFERENTIAL/PLATELET
BASOS ABS: 23 {cells}/uL (ref 0–200)
Basophils Relative: 0.3 %
EOS ABS: 795 {cells}/uL — AB (ref 15–500)
Eosinophils Relative: 10.6 %
HEMATOCRIT: 50.1 % — AB (ref 38.5–50.0)
HEMOGLOBIN: 16.8 g/dL (ref 13.2–17.1)
Lymphs Abs: 1755 cells/uL (ref 850–3900)
MCH: 27.4 pg (ref 27.0–33.0)
MCHC: 33.5 g/dL (ref 32.0–36.0)
MCV: 81.7 fL (ref 80.0–100.0)
MONOS PCT: 8.9 %
MPV: 10.1 fL (ref 7.5–12.5)
NEUTROS ABS: 4260 {cells}/uL (ref 1500–7800)
Neutrophils Relative %: 56.8 %
Platelets: 315 10*3/uL (ref 140–400)
RBC: 6.13 10*6/uL — ABNORMAL HIGH (ref 4.20–5.80)
RDW: 14.4 % (ref 11.0–15.0)
Total Lymphocyte: 23.4 %
WBC mixed population: 668 cells/uL (ref 200–950)
WBC: 7.5 10*3/uL (ref 3.8–10.8)

## 2018-09-09 LAB — COMPLETE METABOLIC PANEL WITH GFR
AG RATIO: 1.7 (calc) (ref 1.0–2.5)
ALKALINE PHOSPHATASE (APISO): 104 U/L (ref 40–115)
ALT: 23 U/L (ref 9–46)
AST: 15 U/L (ref 10–40)
Albumin: 4.7 g/dL (ref 3.6–5.1)
BILIRUBIN TOTAL: 0.7 mg/dL (ref 0.2–1.2)
BUN: 15 mg/dL (ref 7–25)
CALCIUM: 9.6 mg/dL (ref 8.6–10.3)
CHLORIDE: 102 mmol/L (ref 98–110)
CO2: 27 mmol/L (ref 20–32)
Creat: 0.93 mg/dL (ref 0.60–1.35)
GFR, Est African American: 116 mL/min/{1.73_m2} (ref >=60)
GFR, Est Non African American: 100 mL/min/{1.73_m2} (ref >=60)
Globulin: 2.7 g/dL (calc) (ref 1.9–3.7)
Glucose, Bld: 136 mg/dL — ABNORMAL HIGH (ref 65–99)
POTASSIUM: 4.2 mmol/L (ref 3.5–5.3)
Sodium: 140 mmol/L (ref 135–146)
Total Protein: 7.4 g/dL (ref 6.1–8.1)

## 2018-09-09 LAB — MICROALBUMIN / CREATININE URINE RATIO
Creatinine, Urine: 102 mg/dL (ref 20–320)
MICROALB UR: 0.5 mg/dL
MICROALB/CREAT RATIO: 5 ug/mg{creat} (ref <30)

## 2018-09-09 LAB — HEMOGLOBIN A1C
Hgb A1c MFr Bld: 7.3 % of total Hgb — ABNORMAL HIGH (ref <5.7)
Mean Plasma Glucose: 163 (calc)
eAG (mmol/L): 9 (calc)

## 2018-09-09 LAB — VITAMIN D 25 HYDROXY (VIT D DEFICIENCY, FRACTURES): VIT D 25 HYDROXY: 32 ng/mL (ref 30–100)

## 2018-09-09 LAB — LIPID PANEL
CHOLESTEROL: 167 mg/dL (ref <200)
HDL: 39 mg/dL — AB (ref >40)
LDL Cholesterol (Calc): 98 mg/dL (calc)
Non-HDL Cholesterol (Calc): 128 mg/dL (calc) (ref <130)
TRIGLYCERIDES: 198 mg/dL — AB (ref <150)
Total CHOL/HDL Ratio: 4.3 (calc) (ref <5.0)

## 2018-09-09 LAB — VITAMIN B12: VITAMIN B 12: 700 pg/mL (ref 200–1100)

## 2018-11-10 ENCOUNTER — Encounter: Payer: Self-pay | Admitting: Internal Medicine

## 2018-11-10 ENCOUNTER — Ambulatory Visit: Payer: BLUE CROSS/BLUE SHIELD | Admitting: Internal Medicine

## 2018-11-10 VITALS — BP 112/80 | HR 78 | Resp 16 | Ht 70.0 in | Wt 225.0 lb

## 2018-11-10 DIAGNOSIS — J4520 Mild intermittent asthma, uncomplicated: Secondary | ICD-10-CM | POA: Diagnosis not present

## 2018-11-10 MED ORDER — ARNUITY ELLIPTA 100 MCG/ACT IN AEPB: 1.0000 | INHALATION_SPRAY | Freq: Every day | RESPIRATORY_TRACT | 6 refills | Status: DC

## 2018-11-10 NOTE — Progress Notes (Signed)
   Name: Andrew Sawyer A Coach MRN: [id number] DOB: January 14, 1976     CONSULTATION DATE: 3.18.19 REFERRING MD : P[place]ose  CHIEF COMPLAINT: cough  STUDIES:  CXR independently reviewed -2.23.19 I have Independently reviewed images of Interpretation: RT lung opacity c/w pneumonia   HISTORY OF PRESENT ILLNESS: No acute cough No wheezing No shortness of breath Takes Arnuity daily Albuterol 1-2 times per Exposed to secondhand smoke closure  No active tobacco abuse Previous history of pneumonia  No signs of exacerbation at this time  no signs of heart failure  No signs of infection   Review of Systems:  Gen:  Denies  fever, sweats, chills weigh loss  HEENT: Denies blurred vision, double vision, ear pain, eye pain, hearing loss, nose bleeds, sore throat Cardiac:  No dizziness, chest pain or heaviness, chest tightness,edema, No JVD Resp:   No cough, -sputum production, -shortness of breath,-wheezing, -hemoptysis,  Gi: Denies swallowing difficulty, stomach pain, nausea or vomiting, diarrhea, constipation, bowel incontinence Gu:  Denies bladder incontinence, burning urine Ext:   Denies Joint pain, stiffness or swelling Skin: Denies  skin rash, easy bruising or bleeding or hives Endoc:  Denies polyuria, polydipsia , polyphagia or weight change Psych:   Denies depression, insomnia or hallucinations  Other:  All other systems negative  ALL OTHER ROS ARE NEGATIVE  BP 112/80 (BP Location: Left Arm, Cuff Size: Large)   Pulse 78   Resp 16   Ht 5\' 10"  (1.778 m)   Wt 225 lb (102.1 kg)   SpO2 96%   BMI 32.28 kg/m   Physical Examination:   GENERAL:NAD, no fevers, chills, no weakness no fatigue HEAD: Normocephalic, atraumatic.  EYES: Pupils equal, round, reactive to light. Extraocular muscles intact. No scleral icterus.  MOUTH: Moist mucosal membrane. Dentition intact. No abscess noted.  EAR, NOSE, THROAT: Clear without exudates. No external lesions.  NECK: Supple. No thyromegaly. No  nodules. No JVD.  PULMONARY: CTA B/L no wheezing, rhonchi, crackles CARDIOVASCULAR: S1 and S2. Regular rate and rhythm. No murmurs, rubs, or gallops. No edema. Pedal pulses 2+ bilaterally.  GASTROINTESTINAL: Soft, nontender, nondistended. No masses. Positive bowel sounds. No hepatosplenomegaly.  MUSCULOSKELETAL: No swelling, clubbing, or edema. Range of motion full in all extremities.  NEUROLOGIC: Cranial nerves II through XII are intact. No gross focal neurological deficits. Sensation intact. Reflexes intact.  SKIN: No ulceration, lesions, rashes, or cyanosis. Skin warm and dry. Turgor intact.  PSYCHIATRIC: Mood, affect within normal limits. The patient is awake, alert and oriented x 3. Insight, judgment intact.  ALL OTHER ROS ARE NEGATIVE         ASSESSMENT / PLAN: 42 year old pleasant white male seen today for follow-up asthma Seems to be well-controlled at this time with inhaled steroids with Arnuity Symptoms are mild intermittent asthma with occasional albuterol use 1-2 times per week Patient with a previous history of pneumonia  No signs of infection at the  no signs of exacerbation at this time  No indication for systemic steroids or antibiotics at this time  Continue albuterol as needed Continue inhaled steroid as prescribed Avoid allergens  avoid secondhand smoke    Patient  satisfied with Plan of action and management. All questions answered Follow up 1 year  Lucie LeatherKurian David Gabriela Giannelli, M.D.  [facility]  Medical Director [facility] Medical Director [facility]

## 2018-11-10 NOTE — Patient Instructions (Signed)
Continue Inhalers as prescribed  Avoid second hand smoke 

## 2018-12-08 ENCOUNTER — Ambulatory Visit (INDEPENDENT_AMBULATORY_CARE_PROVIDER_SITE_OTHER): Payer: BLUE CROSS/BLUE SHIELD | Admitting: Family Medicine

## 2018-12-08 ENCOUNTER — Encounter: Payer: Self-pay | Admitting: Family Medicine

## 2018-12-08 VITALS — BP 130/84 | HR 87 | Temp 97.5°F | Resp 16 | Ht 70.0 in | Wt 221.4 lb

## 2018-12-08 DIAGNOSIS — R718 Other abnormality of red blood cells: Secondary | ICD-10-CM | POA: Diagnosis not present

## 2018-12-08 DIAGNOSIS — I10 Essential (primary) hypertension: Secondary | ICD-10-CM

## 2018-12-08 DIAGNOSIS — E669 Obesity, unspecified: Secondary | ICD-10-CM

## 2018-12-08 DIAGNOSIS — Z0000 Encounter for general adult medical examination without abnormal findings: Secondary | ICD-10-CM | POA: Diagnosis not present

## 2018-12-08 DIAGNOSIS — E1169 Type 2 diabetes mellitus with other specified complication: Secondary | ICD-10-CM

## 2018-12-08 LAB — CBC WITH DIFFERENTIAL/PLATELET
ABSOLUTE MONOCYTES: 874 {cells}/uL (ref 200–950)
BASOS ABS: 27 {cells}/uL (ref 0–200)
Basophils Relative: 0.3 %
EOS ABS: 346 {cells}/uL (ref 15–500)
Eosinophils Relative: 3.8 %
HEMATOCRIT: 51.2 % — AB (ref 38.5–50.0)
HEMOGLOBIN: 17.4 g/dL — AB (ref 13.2–17.1)
LYMPHS ABS: 2330 {cells}/uL (ref 850–3900)
MCH: 28.2 pg (ref 27.0–33.0)
MCHC: 34 g/dL (ref 32.0–36.0)
MCV: 82.8 fL (ref 80.0–100.0)
MPV: 10.3 fL (ref 7.5–12.5)
Monocytes Relative: 9.6 %
NEUTROS PCT: 60.7 %
Neutro Abs: 5524 cells/uL (ref 1500–7800)
Platelets: 343 10*3/uL (ref 140–400)
RBC: 6.18 10*6/uL — ABNORMAL HIGH (ref 4.20–5.80)
RDW: 14 % (ref 11.0–15.0)
Total Lymphocyte: 25.6 %
WBC: 9.1 10*3/uL (ref 3.8–10.8)

## 2018-12-08 LAB — POCT GLYCOSYLATED HEMOGLOBIN (HGB A1C): HbA1c, POC (controlled diabetic range): 6.8 % (ref 0.0–7.0)

## 2018-12-08 NOTE — Progress Notes (Signed)
Name: Andrew Sawyer   MRN: [id number]    DOB: Jan 13, 1976   Date:12/08/2018       Progress Note  Subjective  Chief Complaint  Chief Complaint  Patient presents with  . Annual Exam    Concerning mole on his mid back    HPI  Patient presents for annual CPE and follow up.  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 Had proteinuria in the past- but normal on his last visit. Denies polyphagia, polyuria, polydipsia.He is on ACE now and is doing well.  HTN Patient takingLotrel 5/40 for kidney protection and bp control . BP is at goal today, no chest pain or palpitation   HLD Takes Lipitor. Denies muscle aches.LDL goal is below 70, last labs still showed elevation of triglycerides and low HDL Lipid Panel     Component Value Date/Time   CHOL 167 09/08/2018 0936   CHOL 186 04/23/2016 1008   TRIG 198 (H) 09/08/2018 0936   HDL 39 (L) 09/08/2018 0936   HDL 44 04/23/2016 1008   CHOLHDL 4.3 09/08/2018 0936   VLDL 38 (H) 08/19/2017 0917   LDLCALC 98 09/08/2018 0936   Asthma He is under the care of Dr. Belia Heman, taking medication as prescribed, no wheezing, he has a chronic cough  or SOB at this time, denies nocturnal symptoms  Vitamin D  Last level back at goal, taking otc supplementation   Abnormal CT Chest CT angio from 09/2016 noted:Situs inversus with chronic bronchiectasis(in the lingula).Impression corroborated with chest x-ray done on 01/2018.He saw Dr. Belia Heman and has diagnosed of asthma, taking medication and feeling better. Unchanged       Elevation HCT     He snores at night, but denies daily symptoms, ESS today was zero , we will recheck level may need a sleep study or  referral to hematologist , explained that uncontrolled asthma may also be the cause  Diet: packing lunch , eating healthier while on the road,  he has increased intake of fruit and vegetables Exercise: he is a truck driver and unable to exercise   Depression:  Depression screen [facility] 2/9 12/08/2018 09/08/2018 06/02/2018 11/25/2017 09/23/2017  Decreased Interest 0 0 0 0 0  Down, Depressed, Hopeless 0 0 0 0 0  PHQ - 2 Score 0 0 0 0 0    Hypertension:  BP Readings from Last 3 Encounters:  12/08/18 130/84  11/10/18 112/80  09/08/18 106/70    Obesity: Wt Readings from Last 3 Encounters:  12/08/18 221 lb 6.4 oz (100.4 kg)  11/10/18 225 lb (102.1 kg)  09/08/18 223 lb 12.8 oz (101.5 kg)   BMI Readings from Last 3 Encounters:  12/08/18 31.77 kg/m  11/10/18 32.28 kg/m  09/08/18 32.11 kg/m     Lipids:  Lab Results  Component Value Date   CHOL 167 09/08/2018   CHOL 172 08/19/2017   CHOL 161 02/04/2017   Lab Results  Component Value Date   HDL 39 (L) 09/08/2018   HDL 43 08/19/2017   HDL 40 (L) 02/04/2017   Lab Results  Component Value Date   LDLCALC 98 09/08/2018   LDLCALC 91 08/19/2017   LDLCALC 90 02/04/2017   Lab Results  Component Value Date   TRIG 198 (H) 09/08/2018   TRIG 190 (H) 08/19/2017  TRIG 155 (H) 02/04/2017   Lab Results  Component Value Date   CHOLHDL 4.3 09/08/2018   CHOLHDL 4.0 08/19/2017   CHOLHDL 4.0 02/04/2017   No results found for: LDLDIRECT Glucose:  Glucose  Date Value Ref Range Status  08/18/2014 131 (H) 65 - 99 mg/dL Final   Glucose, Bld  Date Value Ref Range Status  09/08/2018 136 (H) 65 - 99 mg/dL Final    Comment:    .            Fasting reference interval . For someone without known diabetes, a glucose value >125 mg/dL indicates that they may have diabetes and this should be confirmed with a follow-up test. .   03/03/2018 145 (H) 65 - 99 mg/dL Final    Comment:    .            Fasting reference interval . For someone without known diabetes, a glucose value >125 mg/dL indicates that they may have diabetes and this should be confirmed with a follow-up  test. .   02/04/2017 128 (H) 65 - 99 mg/dL Final   Glucose-Capillary  Date Value Ref Range Status  10/10/2015 80 65 - 99 mg/dL Final      [facility] Visit from 12/08/2018 in [facility]  AUDIT-C Score  0      Married STD testing and prevention (HIV/chl/gon/syphilis): not interested   Skin cancer: discussed atypical lesions  Colorectal cancer: not due yet  Prostate cancer: discussed USPTF and we will not repeat level  Lab Results  Component Value Date   PSA 0.9 09/23/2017    IPSS Questionnaire (AUA-7): Over the past month.   1)  How often have you had a sensation of not emptying your bladder completely after you finish urinating?  0 - Not at all  2)  How often have you had to urinate again less than two hours after you finished urinating? 0 - Not at all  3)  How often have you found you stopped and started again several times when you urinated?  0 - Not at all  4) How difficult have you found it to postpone urination?  0 - Not at all  5) How often have you had a weak urinary stream?  0 - Not at all  6) How often have you had to push or strain to begin urination?  0 - Not at all  7) How many times did you most typically get up to urinate from the time you went to bed until the time you got up in the morning?  0 - None  Total score:  0-7 mildly symptomatic   8-19 moderately symptomatic   20-35 severely symptomatic    ECG:  02/2018  Advanced Care Planning: A voluntary discussion about advance care planning including the explanation and discussion of advance directives.  Discussed health care proxy and Living will, and the patient was able to identify a health care proxy as mother   Patient does not have a living will at present time. If patient does have living will, I have requested they bring this to the clinic to be scanned in to their chart.  Patient Active Problem List   Diagnosis Date Noted  . 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 Dyslipidemia associated with  type 2 diabetes mellitus (HCC) 08/13/2016  . Dermatitis of lower extremity 01/23/2016  . Elevated liver enzymes 07/25/2015  . Asthma, mild 03/30/2015  . Diabetes mellitus type 2 in obese (HCC) 03/30/2015  .  Difficulty hearing 03/30/2015  . Dyslipidemia 03/30/2015  . Benign hypertension 03/30/2015  . Morbid obesity (HCC) 03/30/2015    Past Surgical History:  Procedure Laterality Date  . BILIARY TUBE PLACEMENT ([facility] HX)    . NASAL SINUS SURGERY    . RHINOPLASTY      Family History  Problem Relation Age of Onset  . Diabetes Mother   . Hypertension Father   . Diabetes Brother   . Depression Paternal Grandmother   . Diabetes Paternal Grandmother   . Hypertension Paternal Grandfather   . Heart disease Paternal Grandfather   . Cancer Paternal Grandfather        Prostate    Social History   Socioeconomic History  . Marital status: Married    Spouse name: Tobi Bastos  . Number of children: 0  . Years of education: Not on file  . Highest education level: 12th grade  Occupational History  . Occupation: truck Public librarian Needs  . Financial resource strain: Not hard at all  . Food insecurity:    Worry: Never true    Inability: Never true  . Transportation needs:    Medical: No    Non-medical: No  Tobacco Use  . Smoking status: Never Smoker  . Smokeless tobacco: Never Used  Substance and Sexual Activity  . Alcohol use: No  . Drug use: No  . Sexual activity: Yes    Partners: Female    Birth control/protection: None, Condom  Lifestyle  . Physical activity:    Days per week: 0 days    Minutes per session: 0 min  . Stress: Not at all  Relationships  . Social connections:    Talks on phone: More than three times a week    Gets together: Once a week    Attends religious service: Never    Active member of club or organization: No    Attends meetings of clubs or organizations: Never    Relationship status: Married  . Intimate partner violence:    Fear of current or ex  partner: No    Emotionally abused: No    Physically abused: No    Forced sexual activity: No  Other Topics Concern  . 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  albuterol (PROVENTIL HFA;VENTOLIN HFA) 108 (90 Base) MCG/ACT inhaler, Inhale 2 puffs into the lungs every 6 (six) hours as needed for wheezing or shortness of breath., Disp: 1 Inhaler, Rfl: 2 .  amLODipine-benazepril (LOTREL) 5-40 MG capsule, Take 1 capsule by mouth daily., Disp: 90 capsule, Rfl: 1 .  ARNUITY ELLIPTA 100 MCG/ACT AEPB, Take 1 puff by mouth daily., Disp: 30 each, Rfl: 6 .  atorvastatin (LIPITOR) 40 MG tablet, Take 1 tablet (40 mg total) by mouth at bedtime., Disp: 90 tablet, Rfl: 1 .  Cholecalciferol (VITAMIN D) 2000 units CAPS, Take 1 capsule (2,000 Units total) by mouth daily., Disp: 30 capsule, Rfl: 3 .  Empagliflozin-metFORMIN HCl ER (SYNJARDY XR) 25-1000 MG TB24, Take 1 tablet by mouth daily., Disp: 90 tablet, Rfl: 1 .  ibuprofen (ADVIL,MOTRIN) 800 MG tablet, Take 1 tablet (800 mg total) by mouth every 8 (eight) hours as needed., Disp: 30 tablet, Rfl: 0 .  triamcinolone ointment (KENALOG) 0.5 %, Apply 1 application topically 2 (two) times daily., Disp: 30 g, Rfl: 0 .  vitamin E 400 UNIT capsule, Take 1 capsule by mouth 2 (two) times daily., Disp: , Rfl:   No Known Allergies   ROS  Constitutional: Negative for fever or significant  weight change.  Respiratory: Negative for cough and shortness of breath.   Cardiovascular: Negative for chest pain or palpitations.  Gastrointestinal: Negative for abdominal pain, no bowel changes.  Musculoskeletal: Negative for gait problem or joint swelling.  Skin: Negative for rash.  Neurological: Negative for dizziness or headache.  No other specific complaints in a complete review of systems (except as listed in HPI  above).  Objective  Vitals:   12/08/18 1151  BP: 130/84  Pulse: 87  Resp: 16  Temp: (!) 97.5 F (36.4 C)  TempSrc: Oral  SpO2: 96%  Weight: 221 lb 6.4 oz (100.4 kg)  Height: 5\' 10"  (1.778 m)    Body mass index is 31.77 kg/m.  Physical Exam  Constitutional: Patient appears well-developed and well-nourished. No distress.  HENT: Head: Normocephalic and atraumatic. Ears: B TMs ok, no erythema or effusion; Nose: Nose normal. Mouth/Throat: Oropharynx is clear and moist. No oropharyngeal exudate.  Eyes: Conjunctivae and EOM are normal. Pupils are equal, round, and reactive to light. No scleral icterus.  Neck: Normal range of motion. Neck supple. No JVD present. No thyromegaly present.  Cardiovascular: Normal rate, regular rhythm and normal heart sounds.  No murmur heard. No BLE edema. Pulmonary/Chest: Effort normal and breath sounds normal. No respiratory distress. Abdominal: Soft. Bowel sounds are normal, no distension. There is no tenderness. no masses MALE GENITALIA: Normal descended testes bilaterally, no masses palpated, no hernias, no lesions, no discharge RECTAL: Prostate normal size and consistency, no rectal masses or hemorrhoids Musculoskeletal: Normal range of motion, no joint effusions. No gross deformities Neurological: he is alert and oriented to person, place, and time. No cranial nerve deficit. Coordination, balance, strength, speech and gait are normal.  Skin: Skin is warm and dry. No rash noted. No erythema.  Psychiatric: Patient has a normal mood and affect. behavior is normal. Judgment and thought content normal.  PHQ2/9: Depression screen [facility] 2/9 12/08/2018 09/08/2018 06/02/2018 11/25/2017 09/23/2017  Decreased Interest 0 0 0 0 0  Down, Depressed, Hopeless 0 0 0 0 0  PHQ - 2 Score 0 0 0 0 0     Fall Risk: Fall Risk  12/08/2018 09/08/2018 06/02/2018 03/03/2018 03/03/2018  Falls in the past year? 0 No No No No  Number falls in past yr: 0 - - - -  Injury with Fall?  0 - - - -     Functional Status Survey: Is the patient deaf or have difficulty hearing?: No Does the patient have difficulty seeing, even when wearing glasses/contacts?: No Does the patient have difficulty concentrating, remembering, or making decisions?: No Does the patient have difficulty walking or climbing stairs?: No Does the patient have difficulty dressing or bathing?: No Does the patient have difficulty doing errands alone such as visiting a doctor's office or shopping?: No    Assessment & Plan  1. Diabetes mellitus type 2 in obese (HCC)  - POCT HgB A1C  2. Encounter for routine history and physical exam for male   3. Elevated hematocrit  - CBC with Differential/Platelet  4. Benign hypertension  At goal    -Prostate cancer screening and PSA options (with potential risks and benefits of testing vs not testing) were discussed along with recent recs/guidelines. -USPSTF grade A and B recommendations reviewed with patient; age-appropriate recommendations, preventive care, screening tests,  etc discussed and encouraged; healthy living encouraged; see AVS for patient education given to patient -Discussed importance of 150 minutes of physical activity weekly, eat two servings of fish weekly, eat one serving of tree nuts ( cashews, pistachios, pecans, almonds.Marland Kitchen.) every other day, eat 6 servings of fruit/vegetables daily and drink plenty of water and avoid sweet beverages.

## 2018-12-15 LAB — HM DIABETES EYE EXAM

## 2018-12-16 ENCOUNTER — Encounter: Payer: Self-pay | Admitting: Family Medicine

## 2018-12-29 LAB — BASIC METABOLIC PANEL: Glucose: 120

## 2018-12-29 LAB — LIPID PANEL
Cholesterol: 201 — AB (ref 0–200)
HDL: 37 (ref 35–70)
LDL Cholesterol: 104
Triglycerides: 298 — AB (ref 40–160)

## 2018-12-29 LAB — HEMOGLOBIN A1C: Hemoglobin A1C: 7.1

## 2019-03-16 ENCOUNTER — Other Ambulatory Visit: Payer: Self-pay

## 2019-03-16 ENCOUNTER — Encounter: Payer: Self-pay | Admitting: Family Medicine

## 2019-03-16 ENCOUNTER — Ambulatory Visit (INDEPENDENT_AMBULATORY_CARE_PROVIDER_SITE_OTHER): Payer: BLUE CROSS/BLUE SHIELD | Admitting: Family Medicine

## 2019-03-16 VITALS — BP 110/80 | HR 65 | Temp 98.2°F | Resp 16 | Ht 70.0 in | Wt 216.4 lb

## 2019-03-16 DIAGNOSIS — R7989 Other specified abnormal findings of blood chemistry: Secondary | ICD-10-CM

## 2019-03-16 DIAGNOSIS — E1169 Type 2 diabetes mellitus with other specified complication: Secondary | ICD-10-CM | POA: Diagnosis not present

## 2019-03-16 DIAGNOSIS — I10 Essential (primary) hypertension: Secondary | ICD-10-CM

## 2019-03-16 DIAGNOSIS — L309 Dermatitis, unspecified: Secondary | ICD-10-CM

## 2019-03-16 DIAGNOSIS — E669 Obesity, unspecified: Secondary | ICD-10-CM

## 2019-03-16 DIAGNOSIS — E785 Hyperlipidemia, unspecified: Secondary | ICD-10-CM

## 2019-03-16 MED ORDER — ATORVASTATIN CALCIUM 40 MG PO TABS: 40.0000 mg | ORAL_TABLET | Freq: Every day | ORAL | 1 refills | Status: DC

## 2019-03-16 MED ORDER — TRIAMCINOLONE ACETONIDE 0.5 % EX OINT: 1.0000 "application " | TOPICAL_OINTMENT | Freq: Two times a day (BID) | CUTANEOUS | 0 refills | Status: DC

## 2019-03-16 MED ORDER — AMLODIPINE BESY-BENAZEPRIL HCL 5-40 MG PO CAPS: 1.0000 | ORAL_CAPSULE | Freq: Every day | ORAL | 1 refills | Status: DC

## 2019-03-16 MED ORDER — EMPAGLIFLOZIN-METFORMIN HCL ER 25-1000 MG PO TB24: 1.0000 | ORAL_TABLET | Freq: Every day | ORAL | 2 refills | Status: DC

## 2019-03-16 NOTE — Progress Notes (Signed)
Name: Andrew Sawyer   MRN: [id number]    DOB: 10-17-1976   Date:03/16/2019       Progress Note  Subjective  Chief Complaint  Chief Complaint  Patient presents with  . Diabetes  . Hypertension  . Hyperlipidemia  . 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 Had proteinuria in the past- but normal on his last visit. Denies polyphagia, polyuria, polydipsia.He is on ACE now and is doing well.Denies side effects of medication.   HTN Patient takingLotrel 5/40 for kidney protection and bp control. BP is towards low end of normal but no dizziness, he also denies  chest pain or palpitation   HLD Takes Lipitor. Denies muscle aches.. We will recheck labs on his next visit, last one not fasting at work   Asthma He is under the care of Dr. Belia Heman, taking medication as prescribed, no wheezing, he has a chronic coughb but no  SOB at this time, denies nocturnal symptoms  Vitamin D  Last level back at goal, taking otc supplementation . Unchanged   Abnormal CT Chest CT angio from 09/2016 noted:Situs inversus with chronic bronchiectasis(in the lingula).Impression corroborated with chest x-ray done on 01/2018.He saw Dr. Belia Heman and has diagnosed of asthma, taking medication and feeling better.Continue follow up with Dr. Belia Heman       Elevation HCT     He snores at night, but denies daily symptoms, ESS today was zero , discussed sleep study and he refuses, discussed referral to hematologist but he would like to recheck his labs first   Patient Active Problem List   Diagnosis Date Noted  . 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 Dyslipidemia associated with type 2 diabetes mellitus (HCC) 08/13/2016  . Dermatitis of lower extremity 01/23/2016  . Elevated liver enzymes  07/25/2015  . Asthma, mild 03/30/2015  . Diabetes mellitus type 2 in obese (HCC) 03/30/2015  . Difficulty hearing 03/30/2015  . Dyslipidemia 03/30/2015  . Benign hypertension 03/30/2015  . Morbid obesity (HCC) 03/30/2015    Past Surgical History:  Procedure Laterality Date  . BILIARY TUBE PLACEMENT ([facility] HX)    . NASAL SINUS SURGERY    . RHINOPLASTY      Family History  Problem Relation Age of Onset  . Diabetes Mother   . Hypertension Father   . Diabetes Brother   . Depression Paternal Grandmother   . Diabetes Paternal Grandmother   . Hypertension Paternal Grandfather   . Heart disease Paternal Grandfather   . Cancer Paternal Grandfather        Prostate    Social History   Socioeconomic History  . Marital status: Married    Spouse name: Tobi Bastos  . Number of children: 0  . Years of education: Not on file  . Highest education level: 12th grade  Occupational History  . Occupation: truck Public librarian Needs  . Financial resource strain: Not hard at all  . Food insecurity:    Worry: Never true    Inability: Never true  . Transportation needs:    Medical: No    Non-medical: No  Tobacco Use  . Smoking status: Never Smoker  . Smokeless tobacco: Never Used  Substance and Sexual Activity  .  Alcohol use: No  . Drug use: No  . Sexual activity: Yes    Partners: Female    Birth control/protection: None, Condom  Lifestyle  . Physical activity:    Days per week: 0 days    Minutes per session: 0 min  . Stress: Not at all  Relationships  . Social connections:    Talks on phone: More than three times a week    Gets together: Once a week    Attends religious service: Never    Active member of club or organization: No    Attends meetings of clubs or organizations: Never    Relationship status: Married  . Intimate partner violence:    Fear of current or ex partner: No    Emotionally abused: No    Physically abused: No    Forced sexual activity: No  Other Topics  Concern  . 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  albuterol (PROVENTIL HFA;VENTOLIN HFA) 108 (90 Base) MCG/ACT inhaler, Inhale 2 puffs into the lungs every 6 (six) hours as needed for wheezing or shortness of breath., Disp: 1 Inhaler, Rfl: 2 .  amLODipine-benazepril (LOTREL) 5-40 MG capsule, Take 1 capsule by mouth daily., Disp: 90 capsule, Rfl: 1 .  ARNUITY ELLIPTA 100 MCG/ACT AEPB, Take 1 puff by mouth daily., Disp: 30 each, Rfl: 6 .  atorvastatin (LIPITOR) 40 MG tablet, Take 1 tablet (40 mg total) by mouth at bedtime., Disp: 90 tablet, Rfl: 1 .  Cholecalciferol (VITAMIN D) 2000 units CAPS, Take 1 capsule (2,000 Units total) by mouth daily., Disp: 30 capsule, Rfl: 3 .  Empagliflozin-metFORMIN HCl ER (SYNJARDY XR) 25-1000 MG TB24, Take 1 tablet by mouth daily., Disp: 30 tablet, Rfl: 2 .  ibuprofen (ADVIL,MOTRIN) 800 MG tablet, Take 1 tablet (800 mg total) by mouth every 8 (eight) hours as needed., Disp: 30 tablet, Rfl: 0 .  triamcinolone ointment (KENALOG) 0.5 %, Apply 1 application topically 2 (two) times daily., Disp: 30 g, Rfl: 0 .  vitamin E 400 UNIT capsule, Take 1 capsule by mouth 2 (two) times daily., Disp: , Rfl:   No Known Allergies  I personally reviewed active problem list, medication list, allergies, family history, social history with the patient/caregiver today.   ROS  Constitutional: Negative for fever , positive  weight change.  Respiratory: Negative for cough and shortness of breath.   Cardiovascular: Negative for chest pain or palpitations.  Gastrointestinal: Negative for abdominal pain, no bowel changes.  Musculoskeletal: Negative for gait problem or joint swelling.  Skin: Negative for rash.  Neurological: Negative for dizziness or headache.  No other specific complaints in a complete review of systems  (except as listed in HPI above).  Objective  Vitals:   03/16/19 0825  BP: 110/80  Pulse: 65  Resp: 16  Temp: 98.2 F (36.8 C)  TempSrc: Oral  SpO2: 96%  Weight: 216 lb 6.4 oz (98.2 kg)  Height: 5\' 10"  (1.778 m)    Body mass index is 31.05 kg/m.  Physical Exam  Constitutional: Patient appears well-developed and well-nourished. Obese  No distress.  HEENT: head atraumatic, normocephalic, pupils equal and reactive to light, neck supple, throat within normal limits Cardiovascular: Normal rate, regular rhythm and normal heart sounds.  No murmur heard. No BLE edema. Pulmonary/Chest: Effort normal and breath sounds  normal. No respiratory distress. Abdominal: Soft.  There is no tenderness. Skin: dry on both feet, tattoos  Psychiatric: Patient has a normal mood and affect. behavior is normal. Judgment and thought content normal.  Recent Results (from the past 2160 hour(s))  Basic metabolic panel     Status: None   Collection Time: 12/29/18 12:00 AM  Result Value Ref Range   Glucose 120   Lipid panel     Status: Abnormal   Collection Time: 12/29/18 12:00 AM  Result Value Ref Range   Triglycerides 298 (A) 40 - 160   Cholesterol 201 (A) 0 - 200   HDL 37 35 - 70   LDL Cholesterol 104   Hemoglobin A1c     Status: None   Collection Time: 12/29/18 12:00 AM  Result Value Ref Range   Hemoglobin A1C 7.1     Diabetic Foot Exam: Diabetic Foot Exam - Simple   Simple Foot Form Diabetic Foot exam was performed with the following findings:  Yes 03/16/2019  8:51 AM  Visual Inspection See comments:  Yes Sensation Testing Intact to touch and monofilament testing bilaterally:  Yes Pulse Check Posterior Tibialis and Dorsalis pulse intact bilaterally:  Yes Comments Dry skin      PHQ2/9: Depression screen [facility] 2/9 12/08/2018 09/08/2018 06/02/2018 11/25/2017 09/23/2017  Decreased Interest 0 0 0 0 0  Down, Depressed, Hopeless 0 0 0 0 0  PHQ - 2 Score 0 0 0 0 0     Fall Risk: Fall Risk   03/16/2019 12/08/2018 09/08/2018 06/02/2018 03/03/2018  Falls in the past year? 0 0 No No No  Number falls in past yr: 0 0 - - -  Injury with Fall? 0 0 - - -     Assessment & Plan  1. Benign hypertension  - amLODipine-benazepril (LOTREL) 5-40 MG capsule; Take 1 capsule by mouth daily.  Dispense: 90 capsule; Refill: 1  2. Dyslipidemia  - atorvastatin (LIPITOR) 40 MG tablet; Take 1 tablet (40 mg total) by mouth at bedtime.  Dispense: 90 tablet; Refill: 1  3. Dyslipidemia associated with type 2 diabetes mellitus (HCC)  - atorvastatin (LIPITOR) 40 MG tablet; Take 1 tablet (40 mg total) by mouth at bedtime.  Dispense: 90 tablet; Refill: 1  4. Diabetes mellitus type 2 in obese (HCC)  - Empagliflozin-metFORMIN HCl ER (SYNJARDY XR) 25-1000 MG TB24; Take 1 tablet by mouth daily.  Dispense: 90 tablet; Refill: 1  5. Dermatitis of lower extremity  - triamcinolone ointment (KENALOG) 0.5 %; Apply 1 application topically 2 (two) times daily.  Dispense: 30 g; Refill: 0  6. Abnormal CBC  - CBC with Differential/Platelet - TIBC, ferritin  If HCT still high he does not want sleep study but willing to see hematologist

## 2019-03-17 LAB — CBC WITH DIFFERENTIAL/PLATELET
Absolute Monocytes: 616 cells/uL (ref 200–950)
Basophils Absolute: 31 cells/uL (ref 0–200)
Basophils Relative: 0.4 %
Eosinophils Absolute: 390 cells/uL (ref 15–500)
Eosinophils Relative: 5 %
HCT: 50.3 % — ABNORMAL HIGH (ref 38.5–50.0)
Hemoglobin: 16.7 g/dL (ref 13.2–17.1)
Lymphs Abs: 1942 cells/uL (ref 850–3900)
MCH: 27.5 pg (ref 27.0–33.0)
MCHC: 33.2 g/dL (ref 32.0–36.0)
MCV: 82.9 fL (ref 80.0–100.0)
MONOS PCT: 7.9 %
MPV: 9.7 fL (ref 7.5–12.5)
Neutro Abs: 4820 cells/uL (ref 1500–7800)
Neutrophils Relative %: 61.8 %
Platelets: 307 10*3/uL (ref 140–400)
RBC: 6.07 10*6/uL — ABNORMAL HIGH (ref 4.20–5.80)
RDW: 13.7 % (ref 11.0–15.0)
Total Lymphocyte: 24.9 %
WBC: 7.8 10*3/uL (ref 3.8–10.8)

## 2019-03-17 LAB — IRON,TIBC AND FERRITIN PANEL
%SAT: 28 % (calc) (ref 20–48)
Ferritin: 174 ng/mL (ref 38–380)
Iron: 85 ug/dL (ref 50–180)
TIBC: 306 mcg/dL (calc) (ref 250–425)

## 2019-06-18 ENCOUNTER — Other Ambulatory Visit: Payer: Self-pay | Admitting: Internal Medicine

## 2019-06-18 ENCOUNTER — Other Ambulatory Visit: Payer: Self-pay

## 2019-06-18 MED ORDER — ALBUTEROL SULFATE HFA 108 (90 BASE) MCG/ACT IN AERS: 2.0000 | INHALATION_SPRAY | Freq: Four times a day (QID) | RESPIRATORY_TRACT | 2 refills | Status: DC | PRN

## 2019-06-21 ENCOUNTER — Other Ambulatory Visit: Payer: Self-pay | Admitting: Family Medicine

## 2019-06-21 DIAGNOSIS — E1169 Type 2 diabetes mellitus with other specified complication: Secondary | ICD-10-CM

## 2019-06-21 DIAGNOSIS — E669 Obesity, unspecified: Secondary | ICD-10-CM

## 2019-06-22 ENCOUNTER — Other Ambulatory Visit: Payer: Self-pay

## 2019-06-22 MED ORDER — ARNUITY ELLIPTA 100 MCG/ACT IN AEPB: 1.0000 | INHALATION_SPRAY | Freq: Every day | RESPIRATORY_TRACT | 6 refills | Status: DC

## 2019-06-22 NOTE — Progress Notes (Signed)
Received refill request from  [facility] via fax for [place]. Refilled.

## 2019-06-29 ENCOUNTER — Encounter: Payer: Self-pay | Admitting: Family Medicine

## 2019-06-29 ENCOUNTER — Ambulatory Visit: Payer: BC Managed Care – PPO | Admitting: Family Medicine

## 2019-06-29 ENCOUNTER — Other Ambulatory Visit: Payer: Self-pay

## 2019-06-29 VITALS — BP 102/66 | HR 70 | Temp 97.3°F | Resp 16 | Ht 70.0 in | Wt 216.3 lb

## 2019-06-29 DIAGNOSIS — R718 Other abnormality of red blood cells: Secondary | ICD-10-CM

## 2019-06-29 DIAGNOSIS — E785 Hyperlipidemia, unspecified: Secondary | ICD-10-CM

## 2019-06-29 DIAGNOSIS — E669 Obesity, unspecified: Secondary | ICD-10-CM | POA: Diagnosis not present

## 2019-06-29 DIAGNOSIS — E1169 Type 2 diabetes mellitus with other specified complication: Secondary | ICD-10-CM

## 2019-06-29 DIAGNOSIS — J4520 Mild intermittent asthma, uncomplicated: Secondary | ICD-10-CM

## 2019-06-29 DIAGNOSIS — R7989 Other specified abnormal findings of blood chemistry: Secondary | ICD-10-CM

## 2019-06-29 DIAGNOSIS — I10 Essential (primary) hypertension: Secondary | ICD-10-CM

## 2019-06-29 DIAGNOSIS — E559 Vitamin D deficiency, unspecified: Secondary | ICD-10-CM

## 2019-06-29 DIAGNOSIS — J479 Bronchiectasis, uncomplicated: Secondary | ICD-10-CM

## 2019-06-29 LAB — POCT GLYCOSYLATED HEMOGLOBIN (HGB A1C): HbA1c, POC (controlled diabetic range): 6.8 % (ref 0.0–7.0)

## 2019-06-29 MED ORDER — SYNJARDY XR 25-1000 MG PO TB24: 1.0000 | ORAL_TABLET | Freq: Every day | ORAL | 3 refills | Status: DC

## 2019-06-29 MED ORDER — AMLODIPINE BESYLATE-VALSARTAN 5-160 MG PO TABS: 0.5000 | ORAL_TABLET | Freq: Every day | ORAL | 3 refills | Status: DC

## 2019-06-29 NOTE — Progress Notes (Signed)
Name: Andrew Sawyer   MRN: [id number]    DOB: January 29, 1976   Date:06/29/2019       Progress Note  Subjective  Chief Complaint  Chief Complaint  Patient presents with  . Medication Refill    3 month F/U  . Diabetes    Checks every morning before he eats, Average-110-120  . Hypertension    Denies any symptoms  . Hyperlipidemia  . 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 Had proteinuria in the past-but normal on his last visit.Denies polyphagia, polyuria, polydipsia.He is on ACE now and denies side effects. He is trying to follow diabetic diet, he has eye exam every Dec   HTN Patient takingLotrel 5/40 for kidney protection and bp control. BP istowards low end of normal, again we will try changing to Exforge 5/160 to take half daily and recheck bp later this week, if bp is higher than normal we will resume once daily dose instead of half.   HLD Takes Lipitor. Denies muscle aches. Unchanged   Asthma He is under the care of Dr. Belia HemanKasa, taking medication as prescribed, no wheezing,he has a chronic cough usually productive usually yellow sputum, but no  SOB at this time, denies nocturnal symptoms  Vitamin D  Lastlevel back at goal, taking otc supplementation. Unchanged   Abnormal CT Chest CT angio from 09/2016 noted:Situs inversus with chronic bronchiectasis(in the lingula).Impression corroborated with chest x-ray done on 01/2018.He saw Dr. Belia HemanKasa and has diagnosed of asthma, taking medication and stable, still has daily cough   Patient Active Problem List   Diagnosis Date Noted  . 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 Dyslipidemia associated with type 2 diabetes mellitus (HCC) 08/13/2016  . Dermatitis of lower extremity 01/23/2016  . Elevated liver enzymes 07/25/2015  . Asthma, mild 03/30/2015  . Diabetes mellitus type 2 in obese  (HCC) 03/30/2015  . Difficulty hearing 03/30/2015  . Dyslipidemia 03/30/2015  . Benign hypertension 03/30/2015  . Morbid obesity (HCC) 03/30/2015    Past Surgical History:  Procedure Laterality Date  . BILIARY TUBE PLACEMENT ([facility] HX)    . NASAL SINUS SURGERY    . RHINOPLASTY      Family History  Problem Relation Age of Onset  . Diabetes Mother   . Hypertension Father   . Diabetes Brother   . Depression Paternal Grandmother   . Diabetes Paternal Grandmother   . Hypertension Paternal Grandfather   . Heart disease Paternal Grandfather   . Cancer Paternal Grandfather        Prostate    Social History   Socioeconomic History  . Marital status: Married    Spouse name: Tobi Bastosnna  . Number of children: 0  . Years of education: Not on file  . Highest education level: 12th grade  Occupational History  . Occupation: truck Public librariandriver  Social Needs  . Financial resource strain: Not hard at all  . Food insecurity    Worry: Never true    Inability: Never true  . Transportation needs    Medical: No    Non-medical: No  Tobacco Use  . Smoking status: Never Smoker  . Smokeless tobacco: Never Used  Substance and Sexual Activity  . Alcohol use: No  . Drug use: No  . Sexual activity: Yes    Partners: Female    Birth control/protection: None, Condom  Lifestyle  . Physical activity    Days per week: 0 days    Minutes per session: 0 min  . Stress: Not at all  Relationships  . Social connections    Talks on phone: More than three times a week    Gets together: Once a week    Attends religious service: Never    Active member of club or organization: No    Attends meetings of clubs or organizations: Never    Relationship status: Married  . Intimate partner violence    Fear of current or ex partner: No    Emotionally abused: No    Physically abused: No    Forced sexual activity: No  Other Topics Concern  . 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  albuterol (VENTOLIN HFA) 108 (90 Base) MCG/ACT inhaler, Inhale 2 puffs into the lungs every 6 (six) hours as needed for wheezing or shortness of breath., Disp: 18 g, Rfl: 2 .  amLODipine-benazepril (LOTREL) 5-40 MG capsule, Take 1 capsule by mouth daily., Disp: 90 capsule, Rfl: 1 .  ARNUITY ELLIPTA 100 MCG/ACT AEPB, Take 1 puff by mouth daily., Disp: 30 each, Rfl: 6 .  atorvastatin (LIPITOR) 40 MG tablet, Take 1 tablet (40 mg total) by mouth at bedtime., Disp: 90 tablet, Rfl: 1 .  Cholecalciferol (VITAMIN D) 2000 units CAPS, Take 1 capsule (2,000 Units total) by mouth daily., Disp: 30 capsule, Rfl: 3 .  ibuprofen (ADVIL,MOTRIN) 800 MG tablet, Take 1 tablet (800 mg total) by mouth every 8 (eight) hours as needed., Disp: 30 tablet, Rfl: 0 .  SYNJARDY XR 25-1000 MG TB24, Take 1 tablet by mouth once daily, Disp: 30 tablet, Rfl: 0 .  triamcinolone ointment (KENALOG) 0.5 %, Apply 1 application topically 2 (two) times daily., Disp: 30 g, Rfl: 0 .  vitamin E 400 UNIT capsule, Take 1 capsule by mouth 2 (two) times daily., Disp: , Rfl:   No Known Allergies  I personally reviewed active problem list, medication list, allergies, family history, social history with the patient/caregiver today.   ROS  Constitutional: Negative for fever or weight change.  Respiratory: positive  for cough but no  shortness of breath.   Cardiovascular: Negative for chest pain or palpitations.  Gastrointestinal: Negative for abdominal pain, no bowel changes.  Musculoskeletal: Negative for gait problem or joint swelling.  Skin: Negative for rash.  Neurological: Negative for dizziness or headache.  No other specific complaints in a complete review of systems (except as listed in HPI above).  Objective  Vitals:   06/29/19 1040  BP: 102/66  Pulse: 70  Resp: 16  Temp: (!) 97.3 F  (36.3 C)  TempSrc: Temporal  SpO2: 99%  Weight: 216 lb 4.8 oz (98.1 kg)  Height: 5\' 10"  (1.778 m)    Body mass index is 31.04 kg/m.  Physical Exam  Constitutional: Patient appears well-developed and well-nourished. Obese No distress.  HEENT: head atraumatic, normocephalic, pupils equal and reactive to light, neck supple oral exam not done Cardiovascular: Normal rate, regular rhythm and normal heart sounds.  No murmur heard. No BLE edema. Pulmonary/Chest: Effort normal and breath sounds normal. No respiratory distress. Abdominal: Soft.  There is no tenderness. Psychiatric: Patient has a normal mood and affect. behavior is normal. Judgment and thought content normal.  Recent Results (from the past 2160 hour(s))  POCT HgB A1C     Status: Normal   Collection Time: 06/29/19 11:00 AM  Result Value Ref Range   Hemoglobin A1C     HbA1c POC (<> result, manual entry)     HbA1c, POC (prediabetic range)     HbA1c, POC (controlled diabetic range) 6.8 0.0 - 7.0 %      PHQ2/9: Depression screen [facility] 2/9 06/29/2019 12/08/2018 09/08/2018 06/02/2018 11/25/2017  Decreased Interest 0 0 0 0 0  Down, Depressed, Hopeless 0 0 0 0 0  PHQ - 2 Score 0 0 0 0 0  Altered sleeping 0 - - - -  Tired, decreased energy 0 - - - -  Change in appetite 0 - - - -  Feeling bad or failure about yourself  0 - - - -  Trouble concentrating 0 - - - -  Moving slowly or fidgety/restless 0 - - - -  Suicidal thoughts 0 - - - -  PHQ-9 Score 0 - - - -  Difficult doing work/chores Not difficult at all - - - -    phq 9 is negative   Fall Risk: Fall Risk  06/29/2019 03/16/2019 12/08/2018 09/08/2018 06/02/2018  Falls in the past year? 0 0 0 No No  Number falls in past yr: 0 0 0 - -  Injury with Fall? 0 0 0 - -     Functional Status Survey: Is the patient deaf or have difficulty hearing?: No Does the patient have difficulty seeing, even when wearing glasses/contacts?: No Does the patient have difficulty concentrating,  remembering, or making decisions?: No Does the patient have difficulty walking or climbing stairs?: No Does the patient have difficulty dressing or bathing?: No Does the patient have difficulty doing errands alone such as visiting a doctor's office or shopping?: No    Assessment & Plan  1. Diabetes mellitus type 2 in obese (HCC)  - POCT HgB A1C - Empagliflozin-metFORMIN HCl ER (SYNJARDY XR) 25-1000 MG TB24; Take 1 tablet by mouth daily.  Dispense: 30 tablet; Refill: 3  2. Benign hypertension  - amLODipine-valsartan (EXFORGE) 5-160 MG tablet; Take 0.5-1 tablets by mouth daily. New bp medication  Dispense: 30 tablet; Refill: 3  3. Dyslipidemia associated with type 2 diabetes mellitus ([place])   4. Abnormal CBC   5. Elevated hematocrit  Discussed sleep study but he states he cannot afford   6. Vitamin D deficiency  Taking supplementation   7. Chronic bronchiectasis not affecting current episode of care [facility])  On CT   8. Mild intermittent asthma, unspecified whether complicated  Under the care of Dr. Mortimer Fries

## 2019-07-03 ENCOUNTER — Ambulatory Visit: Payer: BC Managed Care – PPO

## 2019-07-03 ENCOUNTER — Other Ambulatory Visit: Payer: Self-pay

## 2019-07-03 VITALS — BP 124/72 | HR 64

## 2019-07-03 DIAGNOSIS — I10 Essential (primary) hypertension: Secondary | ICD-10-CM

## 2019-07-03 NOTE — Progress Notes (Signed)
Patient is here for a blood pressure check. Patient denies chest pain, palpitations, shortness of breath or visual disturbances. At previous visit blood pressure was 102/66 with a heart rate of 70. Today during nurse visit first check blood pressure was 124/72 and heart rate was 64. He does take any blood pressure medications.   Patient has been taking half of the Amlodipine-Valsartan 5-160 mg and doing well with new dosage. Dr. Ancil Boozer instructed patient to continue new dosage and keep follow up visit.

## 2019-10-26 ENCOUNTER — Other Ambulatory Visit: Payer: Self-pay

## 2019-10-26 DIAGNOSIS — E1169 Type 2 diabetes mellitus with other specified complication: Secondary | ICD-10-CM

## 2019-10-26 DIAGNOSIS — E669 Obesity, unspecified: Secondary | ICD-10-CM

## 2019-10-26 MED ORDER — SYNJARDY XR 25-1000 MG PO TB24: 1.0000 | ORAL_TABLET | Freq: Every day | ORAL | 0 refills | Status: DC

## 2019-11-02 ENCOUNTER — Other Ambulatory Visit: Payer: Self-pay

## 2019-11-02 ENCOUNTER — Encounter: Payer: Self-pay | Admitting: Family Medicine

## 2019-11-02 ENCOUNTER — Ambulatory Visit: Payer: BC Managed Care – PPO | Admitting: Family Medicine

## 2019-11-02 VITALS — BP 110/76 | HR 69 | Temp 97.3°F | Resp 16 | Ht 70.0 in | Wt 219.4 lb

## 2019-11-02 DIAGNOSIS — E559 Vitamin D deficiency, unspecified: Secondary | ICD-10-CM | POA: Diagnosis not present

## 2019-11-02 DIAGNOSIS — E1169 Type 2 diabetes mellitus with other specified complication: Secondary | ICD-10-CM | POA: Diagnosis not present

## 2019-11-02 DIAGNOSIS — Z79899 Other long term (current) drug therapy: Secondary | ICD-10-CM

## 2019-11-02 DIAGNOSIS — E785 Hyperlipidemia, unspecified: Secondary | ICD-10-CM | POA: Diagnosis not present

## 2019-11-02 DIAGNOSIS — Z23 Encounter for immunization: Secondary | ICD-10-CM | POA: Diagnosis not present

## 2019-11-02 DIAGNOSIS — I10 Essential (primary) hypertension: Secondary | ICD-10-CM

## 2019-11-02 DIAGNOSIS — E669 Obesity, unspecified: Secondary | ICD-10-CM | POA: Diagnosis not present

## 2019-11-02 DIAGNOSIS — J4520 Mild intermittent asthma, uncomplicated: Secondary | ICD-10-CM

## 2019-11-02 DIAGNOSIS — R718 Other abnormality of red blood cells: Secondary | ICD-10-CM

## 2019-11-02 DIAGNOSIS — Q893 Situs inversus: Secondary | ICD-10-CM

## 2019-11-02 LAB — POCT GLYCOSYLATED HEMOGLOBIN (HGB A1C): Hemoglobin A1C: 7.1 % — AB (ref 4.0–5.6)

## 2019-11-02 MED ORDER — VALSARTAN 320 MG PO TABS: 320.0000 mg | ORAL_TABLET | Freq: Every day | ORAL | 0 refills | Status: DC

## 2019-11-02 MED ORDER — ATORVASTATIN CALCIUM 40 MG PO TABS: 40.0000 mg | ORAL_TABLET | Freq: Every day | ORAL | 1 refills | Status: DC

## 2019-11-02 MED ORDER — SYNJARDY XR 25-1000 MG PO TB24: 1.0000 | ORAL_TABLET | Freq: Every day | ORAL | 2 refills | Status: DC

## 2019-11-02 NOTE — Progress Notes (Signed)
Name: Andrew Sawyer   MRN: [id number]    DOB: 1976-07-08   Date:11/02/2019       Progress Note  Subjective  Chief Complaint  Chief Complaint  Patient presents with  . Diabetes  . Hypertension  . Asthma  . 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 Had proteinuria in the past-but normal on his last visit.Denies polyphagia, polyuria, polydipsia.He is on ACE now and denies side effects.He states very seldom eats at a restaurant   HTN Patient was  takingLotrel 5/40 for kidney protection and bp control. BP was towards low end of normal, again we will try changing to Exforge 5/160 to take half daily, today bp is still towards low end of normal, we will switch to Diovan 320 mg and if bp remains low we will go down on the dose gradually   HLD Takes Lipitor. Denies muscle aches. Recheck labs   Asthma He is under the care of Dr. Mortimer Fries, taking medication as prescribed, he has daily  wheezing,he has a chronic cough usually productive usually yellow sputum, but noSOB at this time, denies nocturnal symptoms, he has a follow up with Dr. Mortimer Fries next month, advised to discuss symptoms with him, immunizations up to date   Vitamin D  Lastlevel back at goal, taking otc supplementation. We will not recheck labs this time  Abnormal CT Chest CT angio from 09/2016 noted:Situs inversus with chronic bronchiectasis(in the lingula).Impression corroborated with chest x-ray done on 01/2018.He saw Dr. Mortimer Fries and has diagnosed of asthma, taking medication and stable, still has daily cough and wheezing  Patient Active Problem List   Diagnosis Date Noted  . 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 Dyslipidemia associated with type 2 diabetes mellitus ([place]) 08/13/2016  . Dermatitis of lower extremity 01/23/2016  . Elevated  liver enzymes 07/25/2015  . Asthma, mild 03/30/2015  . Diabetes mellitus type 2 in obese ([place]) 03/30/2015  . Difficulty hearing 03/30/2015  . Dyslipidemia 03/30/2015  . Benign hypertension 03/30/2015  . Obesity (BMI 30.0-34.9) 03/30/2015    Past Surgical History:  Procedure Laterality Date  . BILIARY TUBE PLACEMENT ([facility] HX)    . NASAL SINUS SURGERY    . RHINOPLASTY      Family History  Problem Relation Age of Onset  . Diabetes Mother   . Hypertension Father   . Diabetes Brother   . Depression Paternal Grandmother   . Diabetes Paternal Grandmother   . Hypertension Paternal Grandfather   . Heart disease Paternal Grandfather   . Cancer Paternal Grandfather        Prostate    Social History   Socioeconomic History  . Marital status: Married    Spouse name: Vicente Males  . Number of children: 0  . Years of education: Not on file  . Highest education level: 12th grade  Occupational History  . Occupation: truck Animator Needs  . Financial resource strain: Not hard at all  . Food insecurity    Worry: Never true    Inability: Never true  . Transportation needs    Medical: No    Non-medical: No  Tobacco Use  . Smoking status: Never Smoker  . Smokeless tobacco: Never Used  Substance and Sexual Activity  . Alcohol use: No  . Drug use: No  . Sexual  activity: Yes    Partners: Female    Birth control/protection: None, Condom  Lifestyle  . Physical activity    Days per week: 0 days    Minutes per session: 0 min  . Stress: Not at all  Relationships  . Social connections    Talks on phone: More than three times a week    Gets together: Once a week    Attends religious service: Never    Active member of club or organization: No    Attends meetings of clubs or organizations: Never    Relationship status: Married  . Intimate partner violence    Fear of current or ex partner: No    Emotionally abused: No    Physically abused: No    Forced sexual activity: No  Other  Topics Concern  . 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  albuterol (VENTOLIN HFA) 108 (90 Base) MCG/ACT inhaler, Inhale 2 puffs into the lungs every 6 (six) hours as needed for wheezing or shortness of breath., Disp: 18 g, Rfl: 2 .  ARNUITY ELLIPTA 100 MCG/ACT AEPB, Take 1 puff by mouth daily., Disp: 30 each, Rfl: 6 .  atorvastatin (LIPITOR) 40 MG tablet, Take 1 tablet (40 mg total) by mouth at bedtime., Disp: 90 tablet, Rfl: 1 .  Cholecalciferol (VITAMIN D) 2000 units CAPS, Take 1 capsule (2,000 Units total) by mouth daily., Disp: 30 capsule, Rfl: 3 .  Empagliflozin-metFORMIN HCl ER (SYNJARDY XR) 25-1000 MG TB24, Take 1 tablet by mouth daily., Disp: 30 tablet, Rfl: 2 .  ibuprofen (ADVIL,MOTRIN) 800 MG tablet, Take 1 tablet (800 mg total) by mouth every 8 (eight) hours as needed., Disp: 30 tablet, Rfl: 0 .  triamcinolone ointment (KENALOG) 0.5 %, Apply 1 application topically 2 (two) times daily., Disp: 30 g, Rfl: 0 .  vitamin E 400 UNIT capsule, Take 1 capsule by mouth 2 (two) times daily., Disp: , Rfl:  .  valsartan (DIOVAN) 320 MG tablet, Take 1 tablet (320 mg total) by mouth daily., Disp: 90 tablet, Rfl: 0  No Known Allergies  I personally reviewed active problem list, medication list, allergies, family history, social history, health maintenance with the patient/caregiver today.   ROS  Constitutional: Negative for fever or weight change.  Respiratory: Negative for cough and shortness of breath.   Cardiovascular: Negative for chest pain or palpitations.  Gastrointestinal: Negative for abdominal pain, no bowel changes.  Musculoskeletal: Negative for gait problem or joint swelling.  Skin: Negative for rash.  Neurological: Negative for dizziness or headache.  No other specific complaints in a complete review of systems  (except as listed in HPI above).  Objective  Vitals:   11/02/19 0837  BP: 110/76  Pulse: 69  Resp: 16  Temp: (!) 97.3 F (36.3 C)  TempSrc: Temporal  SpO2: 96%  Weight: 219 lb 6.4 oz (99.5 kg)  Height: 5\' 10"  (1.778 m)   Body mass index is 31.48 kg/m.  Physical Exam  Constitutional: Patient appears well-developed and well-nourished. Obese  No distress.  HEENT: head atraumatic, normocephalic, pupils equal and reactive to light, Cardiovascular: Normal rate, regular rhythm and normal heart sounds.  No murmur heard. No BLE edema. Pulmonary/Chest: Effort normal and breath sounds normal. No respiratory distress. Abdominal: Soft.  There is no tenderness. Psychiatric: Patient has a  normal mood and affect. behavior is normal. Judgment and thought content normal.  PHQ2/9: Depression screen [facility] 2/9 11/02/2019 06/29/2019 12/08/2018 09/08/2018 06/02/2018  Decreased Interest 0 0 0 0 0  Down, Depressed, Hopeless 0 0 0 0 0  PHQ - 2 Score 0 0 0 0 0  Altered sleeping 0 0 - - -  Tired, decreased energy 0 0 - - -  Change in appetite 0 0 - - -  Feeling bad or failure about yourself  0 0 - - -  Trouble concentrating 0 0 - - -  Moving slowly or fidgety/restless 0 0 - - -  Suicidal thoughts 0 0 - - -  PHQ-9 Score 0 0 - - -  Difficult doing work/chores - Not difficult at all - - -    phq 9 is negative   Fall Risk: Fall Risk  06/29/2019 03/16/2019 12/08/2018 09/08/2018 06/02/2018  Falls in the past year? 0 0 0 No No  Number falls in past yr: 0 0 0 - -  Injury with Fall? 0 0 0 - -     Assessment & Plan  1. Diabetes mellitus type 2 in obese (HCC)  - POCT HgB A1C - Empagliflozin-metFORMIN HCl ER (SYNJARDY XR) 25-1000 MG TB24; Take 1 tablet by mouth daily.  Dispense: 30 tablet; Refill: 2 - Microalbumin / creatinine urine ratio  2. Need for immunization against influenza  - Flu Vaccine QUAD 36+ mos IM  3. Benign hypertension  - valsartan (DIOVAN) 320 MG tablet; Take 1 tablet (320 mg total)  by mouth daily.  Dispense: 90 tablet; Refill: 0 - COMPLETE METABOLIC PANEL WITH GFR - CBC with Differential/Platelet  4. Dyslipidemia associated with type 2 diabetes mellitus (HCC)  - atorvastatin (LIPITOR) 40 MG tablet; Take 1 tablet (40 mg total) by mouth at bedtime.  Dispense: 90 tablet; Refill: 1 - Lipid panel  5. Vitamin D deficiency  Continue supplementation   6. Elevated hematocrit  Recheck level   7. Mild intermittent asthma, unspecified whether complicated  Keep follow up with Dr. Belia Heman  8. Situs inversus   9. Dyslipidemia  - atorvastatin (LIPITOR) 40 MG tablet; Take 1 tablet (40 mg total) by mouth at bedtime.  Dispense: 90 tablet; Refill: 1 - Lipid panel  10. Long-term use of high-risk medication  - Vitamin B12

## 2019-11-03 LAB — COMPLETE METABOLIC PANEL WITH GFR
AG Ratio: 1.8 (calc) (ref 1.0–2.5)
ALT: 40 U/L (ref 9–46)
AST: 22 U/L (ref 10–40)
Albumin: 4.6 g/dL (ref 3.6–5.1)
Alkaline phosphatase (APISO): 85 U/L (ref 36–130)
BUN: 14 mg/dL (ref 7–25)
CO2: 27 mmol/L (ref 20–32)
Calcium: 9.4 mg/dL (ref 8.6–10.3)
Chloride: 102 mmol/L (ref 98–110)
Creat: 0.88 mg/dL (ref 0.60–1.35)
GFR, Est African American: 121 mL/min/{1.73_m2} (ref >=60)
GFR, Est Non African American: 104 mL/min/{1.73_m2} (ref >=60)
Globulin: 2.6 g/dL (calc) (ref 1.9–3.7)
Glucose, Bld: 147 mg/dL — ABNORMAL HIGH (ref 65–99)
Potassium: 4.1 mmol/L (ref 3.5–5.3)
Sodium: 138 mmol/L (ref 135–146)
Total Bilirubin: 0.7 mg/dL (ref 0.2–1.2)
Total Protein: 7.2 g/dL (ref 6.1–8.1)

## 2019-11-03 LAB — LIPID PANEL
Cholesterol: 187 mg/dL (ref <200)
HDL: 43 mg/dL (ref >=40)
LDL Cholesterol (Calc): 107 mg/dL (calc) — ABNORMAL HIGH
Non-HDL Cholesterol (Calc): 144 mg/dL (calc) — ABNORMAL HIGH (ref <130)
Total CHOL/HDL Ratio: 4.3 (calc) (ref <5.0)
Triglycerides: 258 mg/dL — ABNORMAL HIGH (ref <150)

## 2019-11-03 LAB — CBC WITH DIFFERENTIAL/PLATELET
Absolute Monocytes: 696 cells/uL (ref 200–950)
Basophils Absolute: 44 cells/uL (ref 0–200)
Basophils Relative: 0.5 %
Eosinophils Absolute: 296 cells/uL (ref 15–500)
Eosinophils Relative: 3.4 %
HCT: 51.6 % — ABNORMAL HIGH (ref 38.5–50.0)
Hemoglobin: 17.2 g/dL — ABNORMAL HIGH (ref 13.2–17.1)
Lymphs Abs: 2053 cells/uL (ref 850–3900)
MCH: 28.2 pg (ref 27.0–33.0)
MCHC: 33.3 g/dL (ref 32.0–36.0)
MCV: 84.5 fL (ref 80.0–100.0)
MPV: 9.9 fL (ref 7.5–12.5)
Monocytes Relative: 8 %
Neutro Abs: 5612 cells/uL (ref 1500–7800)
Neutrophils Relative %: 64.5 %
Platelets: 282 10*3/uL (ref 140–400)
RBC: 6.11 10*6/uL — ABNORMAL HIGH (ref 4.20–5.80)
RDW: 13.4 % (ref 11.0–15.0)
Total Lymphocyte: 23.6 %
WBC: 8.7 10*3/uL (ref 3.8–10.8)

## 2019-11-03 LAB — VITAMIN B12: Vitamin B-12: 667 pg/mL (ref 200–1100)

## 2019-11-03 LAB — MICROALBUMIN / CREATININE URINE RATIO
Creatinine, Urine: 74 mg/dL (ref 20–320)
Microalb Creat Ratio: 22 mcg/mg creat (ref <30)
Microalb, Ur: 1.6 mg/dL

## 2019-11-04 ENCOUNTER — Other Ambulatory Visit: Payer: Self-pay | Admitting: Family Medicine

## 2019-11-04 DIAGNOSIS — E1169 Type 2 diabetes mellitus with other specified complication: Secondary | ICD-10-CM

## 2019-11-04 MED ORDER — ROSUVASTATIN CALCIUM 40 MG PO TABS: 40.0000 mg | ORAL_TABLET | Freq: Every day | ORAL | 1 refills | Status: DC

## 2019-11-30 ENCOUNTER — Encounter: Payer: Self-pay | Admitting: Internal Medicine

## 2019-11-30 ENCOUNTER — Other Ambulatory Visit: Payer: Self-pay

## 2019-11-30 ENCOUNTER — Ambulatory Visit (INDEPENDENT_AMBULATORY_CARE_PROVIDER_SITE_OTHER): Payer: BC Managed Care – PPO | Admitting: Internal Medicine

## 2019-11-30 DIAGNOSIS — J4520 Mild intermittent asthma, uncomplicated: Secondary | ICD-10-CM

## 2019-11-30 NOTE — Patient Instructions (Signed)
  MEDICATION ADJUSTMENTS/LABS AND TESTS ORDERED: Continue inhalers as prescribed Avoid triggers

## 2019-11-30 NOTE — Progress Notes (Signed)
   Name: Andrew Sawyer MRN: [id number] DOB: Jun 19, 1976     REFERRING MD : Cecil Cranker  CHIEF COMPLAINT: Follow-up asthma visit  STUDIES:  CXR independently reviewed -2.23.19 I have Independently reviewed images of Interpretation: RT lung opacity c/w pneumonia   HISTORY OF PRESENT ILLNESS: No exacerbation at this time No evidence of heart failure at this time No evidence or signs of infection at this time No respiratory distress No fevers, chills, nausea, vomiting, diarrhea No evidence of lower extremity edema No evidence hemoptysis  Taking Arnuity without any problems Albuterol use is infrequent    Review of Systems:  Gen:  Denies  fever, sweats, chills weight loss  HEENT: Denies blurred vision, double vision, ear pain, eye pain, hearing loss, nose bleeds, sore throat Cardiac:  No dizziness, chest pain or heaviness, chest tightness,edema, No JVD Resp:   No cough, -sputum production, -shortness of breath,-wheezing, -hemoptysis,  Gi: Denies swallowing difficulty, stomach pain, nausea or vomiting, diarrhea, constipation, bowel incontinence Gu:  Denies bladder incontinence, burning urine Ext:   Denies Joint pain, stiffness or swelling Skin: Denies  skin rash, easy bruising or bleeding or hives Endoc:  Denies polyuria, polydipsia , polyphagia or weight change Psych:   Denies depression, insomnia or hallucinations  Other:  All other systems negative   FLU SHOT up to date Nov 2020        ASSESSMENT / PLAN:  43 year old pleasant white male follow-up asthma Well-controlled at this time on Arnuity Symptoms are mild intermittent asthma Infrequent albuterol use Patient with previous history of pneumonia  No signs of infection at this time No signs of exacerbation No indication for steroids or antibiotics at this time  Continue albuterol as needed Continue Arnuity inhaled steroid as prescribed Avoid allergens   COVID-19 EDUCATION: The signs and symptoms of COVID-19  were discussed with the patient and how to seek care for testing.  The importance of social distancing was discussed today. Hand Washing Techniques and avoid touching face was advised.     MEDICATION ADJUSTMENTS/LABS AND TESTS ORDERED: Continue inhalers as prescribed Avoid triggers   CURRENT MEDICATIONS REVIEWED AT LENGTH WITH PATIENT TODAY   Patient satisfied with Plan of action and management. All questions answered  Follow up in 1 year  Total time spent 22 minutes  Maretta Bees Patricia Pesa, M.D.  [facility]  Medical Director [place] Director [facility] Cardio-Pulmonary Department

## 2019-12-01 ENCOUNTER — Encounter: Payer: Self-pay | Admitting: Family Medicine

## 2019-12-01 ENCOUNTER — Telehealth: Payer: Self-pay

## 2019-12-01 NOTE — Telephone Encounter (Signed)
Copied from [place] [id number]. 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 Please advise and call Pt to let know this is ready or ok for pick up.

## 2019-12-01 NOTE — Telephone Encounter (Signed)
Letter was written and last labs attached to it. Patient was notified this letter is upfront and ready for him to pick up.

## 2019-12-25 DIAGNOSIS — Z20822 Contact with and (suspected) exposure to covid-19: Secondary | ICD-10-CM | POA: Diagnosis not present

## 2019-12-25 DIAGNOSIS — Z20828 Contact with and (suspected) exposure to other viral communicable diseases: Secondary | ICD-10-CM | POA: Diagnosis not present

## 2020-02-08 ENCOUNTER — Other Ambulatory Visit: Payer: Self-pay

## 2020-02-08 ENCOUNTER — Ambulatory Visit: Payer: BC Managed Care – PPO | Admitting: Family Medicine

## 2020-02-08 ENCOUNTER — Encounter: Payer: Self-pay | Admitting: Family Medicine

## 2020-02-08 VITALS — BP 122/82 | HR 76 | Temp 96.8°F | Resp 16 | Ht 70.0 in | Wt 220.7 lb

## 2020-02-08 DIAGNOSIS — E785 Hyperlipidemia, unspecified: Secondary | ICD-10-CM

## 2020-02-08 DIAGNOSIS — E669 Obesity, unspecified: Secondary | ICD-10-CM

## 2020-02-08 DIAGNOSIS — J4520 Mild intermittent asthma, uncomplicated: Secondary | ICD-10-CM

## 2020-02-08 DIAGNOSIS — E559 Vitamin D deficiency, unspecified: Secondary | ICD-10-CM

## 2020-02-08 DIAGNOSIS — I10 Essential (primary) hypertension: Secondary | ICD-10-CM

## 2020-02-08 DIAGNOSIS — E1169 Type 2 diabetes mellitus with other specified complication: Secondary | ICD-10-CM | POA: Diagnosis not present

## 2020-02-08 DIAGNOSIS — Q893 Situs inversus: Secondary | ICD-10-CM

## 2020-02-08 LAB — POCT GLYCOSYLATED HEMOGLOBIN (HGB A1C): Hemoglobin A1C: 7 % — AB (ref 4.0–5.6)

## 2020-02-08 MED ORDER — SYNJARDY XR 25-1000 MG PO TB24: 1.0000 | ORAL_TABLET | Freq: Every day | ORAL | 2 refills | Status: DC

## 2020-02-08 MED ORDER — VALSARTAN 160 MG PO TABS: 160.0000 mg | ORAL_TABLET | Freq: Every day | ORAL | 0 refills | Status: DC

## 2020-02-08 NOTE — Progress Notes (Signed)
Name: Andrew Sawyer   MRN: [id number]    DOB: July 06, 1976   Date:02/08/2020       Progress Note  Subjective  Chief Complaint  Chief Complaint  Patient presents with  . Medication Refill  . Hypertension  . Diabetes  . Hyperlipidemia  . 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 Had proteinuria in the past-but 15 Nov 2019 .Denies polyphagia, polyuria, polydipsia.He is on ARB now and is doing well. He takes Synjardi and denies side effects  HTN Patient was  takingLotrel 5/40 for kidney protection and bp control. BP was towards low endof normal, again we will try changing to Exforge 5/160 to take half daily, today bp is still towards low end of normal, we will switch to Diovan 320 mg and he has been out of medication for the past 2 days and bp today is normal, we will decrease Diovan to 160 mg   HLD We switched from Atorvastatin to Rosuvastatin on his last visit because of LDL was 107, he is taking fish oil capsules otc. Advised him to stop otc medication to see if LDL will improve   Asthma He is under the care of Dr. Belia Heman, taking medication as prescribed, he has daily  wheezing,he has a chronic cough usually productive usually yellow sputum,but noSOB at this time, denies nocturnal symptoms, his last visit was end of 2020  Vitamin D  Lastlevel back at goal, taking otc supplementation.Unchanged   Abnormal CT Chest  CT angio from 09/2016 noted:Situs inversus with chronic bronchiectasis(in the lingula).Impression corroborated with chest x-ray done on 01/2018.He saw Dr. Belia Heman and has diagnosed of asthma, taking medication andstable, still has daily cough and wheezing.         Elevated RBC: He is a truck drivers, wakes up feeling rested, advised to discuss it with Dr. Belia Heman and           consider sleep study   Patient Active Problem List   Diagnosis Date Noted  . 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 Dyslipidemia associated with type 2 diabetes mellitus ([facility]) 08/13/2016  . Dermatitis of lower extremity 01/23/2016  . Elevated liver enzymes 07/25/2015  . Asthma, mild 03/30/2015  . Diabetes mellitus type 2 in obese ([facility]) 03/30/2015  . Difficulty hearing 03/30/2015  . Dyslipidemia 03/30/2015  . Benign hypertension 03/30/2015  . Obesity (BMI 30.0-34.9) 03/30/2015    Past Surgical History:  Procedure Laterality Date  . BILIARY TUBE PLACEMENT ([facility] HX)    . NASAL SINUS SURGERY    . RHINOPLASTY      Family History  Problem Relation Age of Onset  . Diabetes Mother   . Hypertension Father   . Diabetes Brother   . Depression Paternal Grandmother   . Diabetes Paternal Grandmother   . Hypertension Paternal Grandfather   . Heart disease Paternal Grandfather   . Cancer Paternal Grandfather        Prostate    Social History   Tobacco Use  . Smoking status: Never Smoker  . Smokeless tobacco: Never Used  Substance Use Topics  . Alcohol use: No  . Drug use: No     Current Outpatient Medications:  .  albuterol (VENTOLIN HFA) 108 (90 Base) MCG/ACT inhaler, Inhale 2 puffs into the lungs every 6 (six) hours as needed for  wheezing or shortness of breath., Disp: 18 g, Rfl: 2 .  ARNUITY ELLIPTA 100 MCG/ACT AEPB, Take 1 puff by mouth daily., Disp: 30 each, Rfl: 6 .  Cholecalciferol (VITAMIN D) 2000 units CAPS, Take 1 capsule (2,000 Units total) by mouth daily., Disp: 30 capsule, Rfl: 3 .  Empagliflozin-metFORMIN HCl ER (SYNJARDY XR) 25-1000 MG TB24, Take 1 tablet by mouth daily., Disp: 30 tablet, Rfl: 2 .  rosuvastatin (CRESTOR) 40 MG tablet, Take 1 tablet (40 mg total) by mouth daily., Disp: 90 tablet, Rfl: 1 .  triamcinolone ointment (KENALOG) 0.5 %, Apply 1 application topically 2 (two) times daily., Disp: 30 g, Rfl: 0 .  valsartan (DIOVAN) 160 MG tablet, Take 1 tablet (160 mg  total) by mouth daily., Disp: 90 tablet, Rfl: 0 .  vitamin E 400 UNIT capsule, Take 1 capsule by mouth 2 (two) times daily., Disp: , Rfl:   No Known Allergies  I personally reviewed active problem list, medication list, allergies, family history, social history, health maintenance with the patient/caregiver today.   ROS  Constitutional: Negative for fever or weight change.  Respiratory: Negative for cough and shortness of breath.   Cardiovascular: Negative for chest pain or palpitations.  Gastrointestinal: Negative for abdominal pain, no bowel changes.  Musculoskeletal: Negative for gait problem or joint swelling.  Skin: Negative for rash.  Neurological: Negative for dizziness or headache.  No other specific complaints in a complete review of systems (except as listed in HPI above).  Objective  Vitals:   02/08/20 0944  BP: 122/82  Pulse: 76  Resp: 16  Temp: (!) 96.8 F (36 C)  SpO2: 97%  Weight: 220 lb 11.2 oz (100.1 kg)  Height: 5\' 10"  (1.778 m)    Body mass index is 31.67 kg/m.  Physical Exam  Constitutional: Patient appears well-developed and well-nourished. Obese  No distress.  HEENT: head atraumatic, normocephalic, pupils equal and reactive to light Cardiovascular: Normal rate, regular rhythm and normal heart sounds.  No murmur heard. No BLE edema. Pulmonary/Chest: Effort normal and breath sounds normal. No respiratory distress. Abdominal: Soft.  There is no tenderness. Psychiatric: Patient has a normal mood and affect. behavior is normal. Judgment and thought content normal.  Recent Results (from the past 2160 hour(s))  POCT glycosylated hemoglobin (Hb A1C)     Status: Abnormal   Collection Time: 02/08/20  9:58 AM  Result Value Ref Range   Hemoglobin A1C 7.0 (A) 4.0 - 5.6 %   HbA1c POC (<> result, manual entry)     HbA1c, POC (prediabetic range)     HbA1c, POC (controlled diabetic range)       PHQ2/9: Depression screen [facility] 2/9 02/08/2020 11/02/2019  06/29/2019 12/08/2018 09/08/2018  Decreased Interest 0 0 0 0 0  Down, Depressed, Hopeless 0 0 0 0 0  PHQ - 2 Score 0 0 0 0 0  Altered sleeping 0 0 0 - -  Tired, decreased energy 0 0 0 - -  Change in appetite 0 0 0 - -  Feeling bad or failure about yourself  0 0 0 - -  Trouble concentrating 0 0 0 - -  Moving slowly or fidgety/restless 0 0 0 - -  Suicidal thoughts 0 0 0 - -  PHQ-9 Score 0 0 0 - -  Difficult doing work/chores Not difficult at all - Not difficult at all - -    phq 9 is negative   Fall Risk: Fall Risk  02/08/2020 06/29/2019 03/16/2019 12/08/2018 09/08/2018  Falls in  the past year? 0 0 0 0 No  Number falls in past yr: 0 0 0 0 -  Injury with Fall? 0 0 0 0 -     Functional Status Survey: Is the patient deaf or have difficulty hearing?: Yes Does the patient have difficulty seeing, even when wearing glasses/contacts?: No Does the patient have difficulty concentrating, remembering, or making decisions?: No Does the patient have difficulty walking or climbing stairs?: No Does the patient have difficulty dressing or bathing?: No Does the patient have difficulty doing errands alone such as visiting a doctor's office or shopping?: No    Assessment & Plan  1. Dyslipidemia associated with type 2 diabetes mellitus (HCC) - Empagliflozin-metFORMIN HCl ER (SYNJARDY XR) 25-1000 MG TB24; Take 1 tablet by mouth daily.  Dispense: 30 tablet; Refill: 2  2. Vitamin D deficiency  Continue vitamin D otc   3. Benign hypertension  Going down on bp and recheck next visit  - valsartan (DIOVAN) 160 MG tablet; Take 1 tablet (160 mg total) by mouth daily.  Dispense: 90 tablet; Refill: 0  4. Mild intermittent asthma, unspecified whether complicated   5. Situs inversus   6. Diabetes mellitus type 2 in obese (HCC)  - POCT glycosylated hemoglobin (Hb A1C) - Empagliflozin-metFORMIN HCl ER (SYNJARDY XR) 25-1000 MG TB24; Take 1 tablet by mouth daily.  Dispense: 30 tablet; Refill: 2

## 2020-02-27 ENCOUNTER — Other Ambulatory Visit: Payer: Self-pay | Admitting: Internal Medicine

## 2020-03-26 ENCOUNTER — Other Ambulatory Visit: Payer: Self-pay | Admitting: Internal Medicine

## 2020-04-29 ENCOUNTER — Telehealth: Payer: Self-pay | Admitting: Internal Medicine

## 2020-04-29 NOTE — Telephone Encounter (Signed)
Called and spoke with the pt He states that the [facility] on [address] advised him that he can not get his arnuity filled bc there is something wrong with Dr Clov[place] Fredrickson license. I called and spoke with the pharm tech there. She states that the license is inactive. I advised to please fill under Dr Jayme Cloud for now.  I called pt back and let her know that this was taken care of and rx should be ready for him to pick up soon.

## 2020-05-09 ENCOUNTER — Encounter: Payer: Self-pay | Admitting: Family Medicine

## 2020-05-09 ENCOUNTER — Other Ambulatory Visit: Payer: Self-pay

## 2020-05-09 ENCOUNTER — Ambulatory Visit: Payer: BC Managed Care – PPO | Admitting: Family Medicine

## 2020-05-09 VITALS — BP 120/74 | HR 76 | Temp 97.5°F | Resp 16 | Ht 70.0 in | Wt 218.8 lb

## 2020-05-09 DIAGNOSIS — E785 Hyperlipidemia, unspecified: Secondary | ICD-10-CM | POA: Diagnosis not present

## 2020-05-09 DIAGNOSIS — J4520 Mild intermittent asthma, uncomplicated: Secondary | ICD-10-CM | POA: Diagnosis not present

## 2020-05-09 DIAGNOSIS — E669 Obesity, unspecified: Secondary | ICD-10-CM

## 2020-05-09 DIAGNOSIS — J479 Bronchiectasis, uncomplicated: Secondary | ICD-10-CM

## 2020-05-09 DIAGNOSIS — R7989 Other specified abnormal findings of blood chemistry: Secondary | ICD-10-CM | POA: Diagnosis not present

## 2020-05-09 DIAGNOSIS — E1169 Type 2 diabetes mellitus with other specified complication: Secondary | ICD-10-CM | POA: Diagnosis not present

## 2020-05-09 DIAGNOSIS — I10 Essential (primary) hypertension: Secondary | ICD-10-CM

## 2020-05-09 DIAGNOSIS — Q893 Situs inversus: Secondary | ICD-10-CM

## 2020-05-09 DIAGNOSIS — Z79899 Other long term (current) drug therapy: Secondary | ICD-10-CM

## 2020-05-09 MED ORDER — XIGDUO XR 10-1000 MG PO TB24: 1.0000 | ORAL_TABLET | Freq: Every day | ORAL | 0 refills | Status: DC

## 2020-05-09 MED ORDER — VALSARTAN 160 MG PO TABS: 160.0000 mg | ORAL_TABLET | Freq: Every day | ORAL | 1 refills | Status: DC

## 2020-05-09 MED ORDER — ROSUVASTATIN CALCIUM 40 MG PO TABS: 40.0000 mg | ORAL_TABLET | Freq: Every day | ORAL | 1 refills | Status: DC

## 2020-05-09 NOTE — Progress Notes (Signed)
Name: Andrew Sawyer   MRN: [id number]    DOB: 1976-03-02   Date:05/09/2020       Progress Note  Subjective  Chief Complaint  Chief Complaint  Patient presents with  . Diabetes  . Dyslipidemia  . Asthma  . 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 He has a history of microalbuminuria but back to normal, last value was done 10/2019 . He denies polyphagia, polyuria, polydipsia.He is on ARB. He takes Synjardi and denies side effects  HTN He was on Lotrel 5/40 but switched to Exforge , and bp continued to be low , we titrated down to Diovan 320, and currently on 160 dose, he did not take medication this am and bp is normal. He denies chest pain, palpitation or dizziness. We will continue current dose , he will check bp at home and see if it is dropping and we may have to do down on dose again   HLD We switched from Atorvastatin to Rosuvastatin on his last visit because of LDL was 107, he is taking fish oil capsules otc. We advised him to stop taking otc fish oil and we will repeat levels today, Goal LDL is below 70   Asthma He is under the care of Dr. Belia Heman, taking medication as prescribed,he has dailywheezing,he has a chronic cough usually productive usually yellow sputum,but noSOB at this time, denies nocturnal symptoms.   Vitamin D  Lastlevel back at goal, taking otc supplementation.Cotninue supplementation   Abnormal CT Chest  CT angio from 09/2016 noted:Situs inversus with chronic bronchiectasis(in the lingula).Impression corroborated with chest x-ray done on 01/2018.He saw Dr. Belia Heman and has diagnosed of asthma, taking medication andstable, still has daily cough and wheezing. Last visit with Dr. Belia Heman was 11/2019          Elevated RBC: He is a truck drivers, wakes up feeling rested, advised to discuss it with Dr. Belia Heman. He refuses sleep       study, explained that if he is unable to tolerate CPAP he may be a good candidate for a mouth piece  Patient Active Problem List   Diagnosis Date Noted  . 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 Dyslipidemia associated with type 2 diabetes mellitus (HCC) 08/13/2016  . Dermatitis of lower extremity 01/23/2016  . Elevated liver enzymes 07/25/2015  . Asthma, mild 03/30/2015  . Diabetes mellitus type 2 in obese (HCC) 03/30/2015  . Difficulty hearing 03/30/2015  . Dyslipidemia 03/30/2015  . Benign hypertension 03/30/2015  . Obesity (BMI 30.0-34.9) 03/30/2015    Past Surgical History:  Procedure Laterality Date  . BILIARY TUBE PLACEMENT ([facility] HX)    . NASAL SINUS SURGERY    . RHINOPLASTY      Family History  Problem Relation Age of Onset  . Diabetes Mother   . Hypertension Father   . Diabetes Brother   . Depression Paternal Grandmother   . Diabetes Paternal Grandmother   . Hypertension Paternal Grandfather   . Heart disease Paternal Grandfather   . Cancer Paternal Grandfather        Prostate    Social History   Tobacco Use  . Smoking status: Never Smoker  . Smokeless tobacco: Never Used  Substance Use Topics  .  Alcohol use: No     Current Outpatient Medications:  .  albuterol (VENTOLIN HFA) 108 (90 Base) MCG/ACT inhaler, Inhale 2 puffs into the lungs every 6 (six) hours as needed for wheezing or shortness of breath., Disp: 18 g, Rfl: 2 .  ARNUITY ELLIPTA 100 MCG/ACT AEPB, Inhale 1 puff by mouth once daily, Disp: 30 each, Rfl: 12 .  Cholecalciferol (VITAMIN D) 2000 units CAPS, Take 1 capsule (2,000 Units total) by mouth daily., Disp: 30 capsule, Rfl: 3 .  rosuvastatin (CRESTOR) 40 MG tablet, Take 1 tablet (40 mg total) by mouth daily., Disp: 90 tablet, Rfl: 1 .  triamcinolone ointment (KENALOG) 0.5 %, Apply 1 application topically 2 (two) times daily., Disp: 30  g, Rfl: 0 .  valsartan (DIOVAN) 160 MG tablet, Take 1 tablet (160 mg total) by mouth daily., Disp: 90 tablet, Rfl: 1 .  vitamin E 400 UNIT capsule, Take 1 capsule by mouth 2 (two) times daily., Disp: , Rfl:  .  Dapagliflozin-metFORMIN HCl ER (XIGDUO XR) 09-999 MG TB24, Take 1 tablet by mouth daily., Disp: 90 tablet, Rfl: 0  No Known Allergies  I personally reviewed active problem list, medication list, allergies, family history, social history, health maintenance with the patient/caregiver today.   ROS  Constitutional: Negative for fever or weight change.  Respiratory:Positive for chronic  cough and shortness of breath.   Cardiovascular: Negative for chest pain or palpitations.  Gastrointestinal: Negative for abdominal pain, no bowel changes.  Musculoskeletal: Negative for gait problem or joint swelling.  Skin: Negative for rash.  Neurological: Negative for dizziness or headache.  No other specific complaints in a complete review of systems (except as listed in HPI above).  Objective  Vitals:   05/09/20 0817  BP: 120/74  Pulse: 76  Resp: 16  Temp: (!) 97.5 F (36.4 C)  TempSrc: Temporal  SpO2: 95%  Weight: 218 lb 12.8 oz (99.2 kg)  Height: 5\' 10"  (1.778 m)    Body mass index is 31.39 kg/m.  Physical Exam  Constitutional: Patient appears well-developed and well-nourished. Obese  No distress.  HEENT: head atraumatic, normocephalic, pupils equal and reactive to light Cardiovascular: Normal rate, regular rhythm and normal heart sounds.  No murmur heard. No BLE edema. Pulmonary/Chest: Effort normal and breath sounds normal. No respiratory distress. Abdominal: Soft.  There is no tenderness. Psychiatric: Patient has a normal mood and affect. behavior is normal. Judgment and thought content normal.   Diabetic Foot Exam: Diabetic Foot Exam - Simple   Simple Foot Form Diabetic Foot exam was performed with the following findings: Yes 05/09/2020  8:36 AM  Visual  Inspection See comments: Yes Sensation Testing Intact to touch and monofilament testing bilaterally: Yes Pulse Check Posterior Tibialis and Dorsalis pulse intact bilaterally: Yes Comments Dry feet , advised moisturizer     PHQ2/9: Depression screen [facility] 2/9 05/09/2020 02/08/2020 11/02/2019 06/29/2019 12/08/2018  Decreased Interest 0 0 0 0 0  Down, Depressed, Hopeless 0 0 0 0 0  PHQ - 2 Score 0 0 0 0 0  Altered sleeping 0 0 0 0 -  Tired, decreased energy 0 0 0 0 -  Change in appetite 0 0 0 0 -  Feeling bad or failure about yourself  0 0 0 0 -  Trouble concentrating 0 0 0 0 -  Moving slowly or fidgety/restless 0 0 0 0 -  Suicidal thoughts 0 0 0 0 -  PHQ-9 Score 0 0 0 0 -  Difficult doing work/chores - Not difficult  at all - Not difficult at all -    phq 9 is negative   Fall Risk: Fall Risk  05/09/2020 02/08/2020 06/29/2019 03/16/2019 12/08/2018  Falls in the past year? 0 0 0 0 0  Number falls in past yr: 0 0 0 0 0  Injury with Fall? 0 0 0 0 0     Functional Status Survey: Is the patient deaf or have difficulty hearing?: No Does the patient have difficulty seeing, even when wearing glasses/contacts?: No Does the patient have difficulty concentrating, remembering, or making decisions?: No Does the patient have difficulty walking or climbing stairs?: No Does the patient have difficulty dressing or bathing?: No Does the patient have difficulty doing errands alone such as visiting a doctor's office or shopping?: No    Assessment & Plan  1. Diabetes mellitus type 2 in obese (HCC)  - POCT HgB A1C - Lipid panel - COMPLETE METABOLIC PANEL WITH GFR - Dapagliflozin-metFORMIN HCl ER (XIGDUO XR) 09-999 MG TB24; Take 1 tablet by mouth daily.  Dispense: 90 tablet; Refill: 0 - Hemoglobin A1c  2. Dyslipidemia associated with type 2 diabetes mellitus (HCC)  - Lipid panel - COMPLETE METABOLIC PANEL WITH GFR - rosuvastatin (CRESTOR) 40 MG tablet; Take 1 tablet (40 mg total) by mouth  daily.  Dispense: 90 tablet; Refill: 1 - Hemoglobin A1c  3. Mild intermittent asthma, unspecified whether complicated  Stable  4. Benign hypertension  - CBC with Differential/Platelet - valsartan (DIOVAN) 160 MG tablet; Take 1 tablet (160 mg total) by mouth daily.  Dispense: 90 tablet; Refill: 1  5. Dyslipidemia  Recheck labs  6. Abnormal CBC  - CBC with Differential/Platelet  7. Long-term use of high-risk medication  - CBC with Differential/Platelet  8. Chronic bronchiectasis not affecting current episode of care (HCC)   9. Situs inversus

## 2020-05-10 LAB — CBC WITH DIFFERENTIAL/PLATELET
Absolute Monocytes: 850 cells/uL (ref 200–950)
Basophils Absolute: 34 cells/uL (ref 0–200)
Basophils Relative: 0.4 %
Eosinophils Absolute: 383 cells/uL (ref 15–500)
Eosinophils Relative: 4.5 %
HCT: 54 % — ABNORMAL HIGH (ref 38.5–50.0)
Hemoglobin: 17.1 g/dL (ref 13.2–17.1)
Lymphs Abs: 1743 cells/uL (ref 850–3900)
MCH: 27.6 pg (ref 27.0–33.0)
MCHC: 31.7 g/dL — ABNORMAL LOW (ref 32.0–36.0)
MCV: 87.2 fL (ref 80.0–100.0)
MPV: 10 fL (ref 7.5–12.5)
Monocytes Relative: 10 %
Neutro Abs: 5491 cells/uL (ref 1500–7800)
Neutrophils Relative %: 64.6 %
Platelets: 279 10*3/uL (ref 140–400)
RBC: 6.19 10*6/uL — ABNORMAL HIGH (ref 4.20–5.80)
RDW: 13.6 % (ref 11.0–15.0)
Total Lymphocyte: 20.5 %
WBC: 8.5 10*3/uL (ref 3.8–10.8)

## 2020-05-10 LAB — COMPLETE METABOLIC PANEL WITH GFR
AG Ratio: 1.7 (calc) (ref 1.0–2.5)
ALT: 16 U/L (ref 9–46)
AST: 14 U/L (ref 10–40)
Albumin: 4.4 g/dL (ref 3.6–5.1)
Alkaline phosphatase (APISO): 81 U/L (ref 36–130)
BUN: 11 mg/dL (ref 7–25)
CO2: 27 mmol/L (ref 20–32)
Calcium: 9.1 mg/dL (ref 8.6–10.3)
Chloride: 104 mmol/L (ref 98–110)
Creat: 0.97 mg/dL (ref 0.60–1.35)
GFR, Est African American: 109 mL/min/{1.73_m2} (ref >=60)
GFR, Est Non African American: 94 mL/min/{1.73_m2} (ref >=60)
Globulin: 2.6 g/dL (calc) (ref 1.9–3.7)
Glucose, Bld: 166 mg/dL — ABNORMAL HIGH (ref 65–99)
Potassium: 4.1 mmol/L (ref 3.5–5.3)
Sodium: 140 mmol/L (ref 135–146)
Total Bilirubin: 0.4 mg/dL (ref 0.2–1.2)
Total Protein: 7 g/dL (ref 6.1–8.1)

## 2020-05-10 LAB — LIPID PANEL
Cholesterol: 159 mg/dL (ref <200)
HDL: 45 mg/dL (ref >=40)
LDL Cholesterol (Calc): 79 mg/dL (calc)
Non-HDL Cholesterol (Calc): 114 mg/dL (calc) (ref <130)
Total CHOL/HDL Ratio: 3.5 (calc) (ref <5.0)
Triglycerides: 265 mg/dL — ABNORMAL HIGH (ref <150)

## 2020-05-10 LAB — HEMOGLOBIN A1C
Hgb A1c MFr Bld: 7.1 % of total Hgb — ABNORMAL HIGH (ref <5.7)
Mean Plasma Glucose: 157 (calc)
eAG (mmol/L): 8.7 (calc)

## 2020-06-03 DIAGNOSIS — Z713 Dietary counseling and surveillance: Secondary | ICD-10-CM | POA: Diagnosis not present

## 2020-08-22 ENCOUNTER — Encounter: Payer: Self-pay | Admitting: Family Medicine

## 2020-08-22 ENCOUNTER — Other Ambulatory Visit: Payer: Self-pay

## 2020-08-22 ENCOUNTER — Ambulatory Visit: Payer: BC Managed Care – PPO | Admitting: Family Medicine

## 2020-08-22 VITALS — BP 124/86 | HR 71 | Temp 97.7°F | Resp 18 | Ht 70.0 in | Wt 223.0 lb

## 2020-08-22 DIAGNOSIS — E1169 Type 2 diabetes mellitus with other specified complication: Secondary | ICD-10-CM

## 2020-08-22 DIAGNOSIS — Q893 Situs inversus: Secondary | ICD-10-CM

## 2020-08-22 DIAGNOSIS — I10 Essential (primary) hypertension: Secondary | ICD-10-CM

## 2020-08-22 DIAGNOSIS — E559 Vitamin D deficiency, unspecified: Secondary | ICD-10-CM | POA: Diagnosis not present

## 2020-08-22 DIAGNOSIS — J4520 Mild intermittent asthma, uncomplicated: Secondary | ICD-10-CM

## 2020-08-22 DIAGNOSIS — L309 Dermatitis, unspecified: Secondary | ICD-10-CM

## 2020-08-22 DIAGNOSIS — E669 Obesity, unspecified: Secondary | ICD-10-CM | POA: Diagnosis not present

## 2020-08-22 DIAGNOSIS — E785 Hyperlipidemia, unspecified: Secondary | ICD-10-CM

## 2020-08-22 DIAGNOSIS — J479 Bronchiectasis, uncomplicated: Secondary | ICD-10-CM

## 2020-08-22 LAB — POCT GLYCOSYLATED HEMOGLOBIN (HGB A1C): Hemoglobin A1C: 8 % — AB (ref 4.0–5.6)

## 2020-08-22 MED ORDER — OZEMPIC (0.25 OR 0.5 MG/DOSE) 2 MG/1.5ML ~~LOC~~ SOPN: 0.5000 mg | PEN_INJECTOR | SUBCUTANEOUS | 0 refills | Status: DC

## 2020-08-22 MED ORDER — VALSARTAN 160 MG PO TABS: 160.0000 mg | ORAL_TABLET | Freq: Every day | ORAL | 1 refills | Status: DC

## 2020-08-22 MED ORDER — XIGDUO XR 10-1000 MG PO TB24: 1.0000 | ORAL_TABLET | Freq: Every day | ORAL | 1 refills | Status: DC

## 2020-08-22 MED ORDER — ROSUVASTATIN CALCIUM 40 MG PO TABS: 40.0000 mg | ORAL_TABLET | Freq: Every day | ORAL | 1 refills | Status: DC

## 2020-08-22 NOTE — Progress Notes (Signed)
Name: Andrew Sawyer   MRN: [id number]    DOB: Sep 03, 1976   Date:08/22/2020       Progress Note  Subjective  Chief Complaint  Chief Complaint  Patient presents with  . Follow-up  . 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 He has a history of microalbuminuria but back to normal, last value was done 10/2019 . He denies polyphagia, polyuria, polydipsia.He is on ARB. He is taking [place] but A1C keeps going up, he is willing to try Ozempic, discussed possible side effects, he denies personal history of pancreatitis or family history of thyroid cancer.   HTN He was on Lotrel 5/40 , after that  Exforge , and bp continued to be low , we titrated down to Diovan 320, and currently on 160 dose for months and is doing well, bp is at goal today. He denies chest pain, palpitation or dizziness. We will continue current dose.   HLD We switched from Atorvastatin to Rosuvastatin back in Nov 2020 because of LDL was 107, he stopped taking fish oil, and last LDL was 79, goal is below 70. We will add medication to control diabetes and recheck levels next time, if LDL still above 70 we will add Zetia   Asthma He is under the care of Dr. Belia Heman, taking medication as prescribed,he has dailywheezing,he has a chronic cough usually productive usually yellow sputum,but noSOB at this time, denies nocturnal symptoms. Anurity is very expensive and he will discuss other options with him   Vitamin D  Lastlevel back at goal, taking otc supplementation.Continue  supplementation   Abnormal CT Chest  CT angio from 09/2016 noted:Situs inversus with chronic bronchiectasis(in the lingula).Impression corroborated with chest x-ray done on 01/2018.He saw Dr. Belia Heman and has diagnosed of asthma, taking medication andstable,  still has daily cough and wheezing.Last visit with Dr. Belia Heman was 11/2019 , he is going on a yearly basis   Elevated RBC: He is a truck drivers, wakes up feeling rested, advised to discuss it with Dr. Belia Heman. He refuses sleep       study, explained that if he is unable to tolerate CPAP he may be a good candidate for a mouth piece. He states he would       have to pay out of pocket and cannot afford it   Patient Active Problem List   Diagnosis Date Noted  . 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 Dyslipidemia associated with type 2 diabetes mellitus (HCC) 08/13/2016  . Dermatitis of lower extremity 01/23/2016  . Elevated liver enzymes 07/25/2015  . Asthma, mild 03/30/2015  . Diabetes mellitus type 2 in obese (HCC) 03/30/2015  . Difficulty hearing 03/30/2015  . Dyslipidemia 03/30/2015  . Benign hypertension 03/30/2015  . Obesity (BMI 30.0-34.9) 03/30/2015    Past Surgical History:  Procedure Laterality Date  . BILIARY TUBE PLACEMENT ([facility] HX)    . NASAL SINUS SURGERY    . RHINOPLASTY      Family History  Problem Relation Age of Onset  . Diabetes Mother   . Hypertension Father   . Diabetes Brother   . Depression Paternal Grandmother   . Diabetes Paternal Grandmother   . Hypertension Paternal Grandfather   . Heart disease Paternal Grandfather   . Cancer Paternal Grandfather  Prostate    Social History   Tobacco Use  . Smoking status: Never Smoker  . Smokeless tobacco: Never Used  Substance Use Topics  . Alcohol use: No     Current Outpatient Medications:  .  albuterol (VENTOLIN HFA) 108 (90 Base) MCG/ACT inhaler, Inhale 2 puffs into the lungs every 6 (six) hours as needed for wheezing or shortness of breath., Disp: 18 g, Rfl: 2 .  ARNUITY ELLIPTA 100 MCG/ACT AEPB, Inhale 1 puff by mouth once daily, Disp: 30 each, Rfl: 12 .  Cholecalciferol (VITAMIN D) 2000 units CAPS, Take 1 capsule (2,000 Units total) by mouth daily., Disp: 30 capsule, Rfl: 3 .  Dapagliflozin-metFORMIN  HCl ER (XIGDUO XR) 09-999 MG TB24, Take 1 tablet by mouth daily., Disp: 90 tablet, Rfl: 1 .  rosuvastatin (CRESTOR) 40 MG tablet, Take 1 tablet (40 mg total) by mouth daily., Disp: 90 tablet, Rfl: 1 .  triamcinolone ointment (KENALOG) 0.5 %, Apply 1 application topically 2 (two) times daily., Disp: 30 g, Rfl: 0 .  valsartan (DIOVAN) 160 MG tablet, Take 1 tablet (160 mg total) by mouth daily., Disp: 90 tablet, Rfl: 1 .  vitamin E 400 UNIT capsule, Take 1 capsule by mouth 2 (two) times daily., Disp: , Rfl:  .  Semaglutide,0.25 or 0.5MG /DOS, (OZEMPIC, 0.25 OR 0.5 MG/DOSE,) 2 MG/1.5ML SOPN, Inject 0.375 mLs (0.5 mg total) into the skin once a week., Disp: 9 mL, Rfl: 0  No Known Allergies  I personally reviewed active problem list, medication list, allergies, family history, social history, health maintenance with the patient/caregiver today.   ROS  Constitutional: Negative for fever or weight change.  Respiratory: Negative for cough and shortness of breath.   Cardiovascular: Negative for chest pain or palpitations.  Gastrointestinal: Negative for abdominal pain, no bowel changes.  Musculoskeletal: Negative for gait problem or joint swelling.  Skin: Negative for rash.  Neurological: Negative for dizziness or headache.  No other specific complaints in a complete review of systems (except as listed in HPI above).  Objective  Vitals:   08/22/20 0835  BP: 124/86  Pulse: 71  Resp: 18  Temp: 97.7 F (36.5 C)  TempSrc: Oral  SpO2: 97%  Weight: 223 lb (101.2 kg)  Height: 5\' 10"  (1.778 m)    Body mass index is 32 kg/m.  Physical Exam  Constitutional: Patient appears well-developed and well-nourished. Obese  No distress.  HEENT: head atraumatic, normocephalic, pupils equal and reactive to light,  neck supple Cardiovascular: Normal rate, regular rhythm and normal heart sounds.  No murmur heard. No BLE edema. Pulmonary/Chest: Effort normal and breath sounds normal. No respiratory  distress. Abdominal: Soft.  There is no tenderness. Psychiatric: Patient has a normal mood and affect. behavior is normal. Judgment and thought content normal.    Recent Results (from the past 2160 hour(s))  POCT HgB A1C     Status: Abnormal   Collection Time: 08/22/20  8:40 AM  Result Value Ref Range   Hemoglobin A1C 8.0 (A) 4.0 - 5.6 %   HbA1c POC (<> result, manual entry)     HbA1c, POC (prediabetic range)     HbA1c, POC (controlled diabetic range)       PHQ2/9: Depression screen [facility] 2/9 08/22/2020 05/09/2020 02/08/2020 11/02/2019 06/29/2019  Decreased Interest 0 0 0 0 0  Down, Depressed, Hopeless 0 0 0 0 0  PHQ - 2 Score 0 0 0 0 0  Altered sleeping 0 0 0 0 0  Tired, decreased energy 0 0  0 0 0  Change in appetite 0 0 0 0 0  Feeling bad or failure about yourself  0 0 0 0 0  Trouble concentrating 0 0 0 0 0  Moving slowly or fidgety/restless 0 0 0 0 0  Suicidal thoughts 0 0 0 0 0  PHQ-9 Score 0 0 0 0 0  Difficult doing work/chores - - Not difficult at all - Not difficult at all    phq 9 is negative   Fall Risk: Fall Risk  08/22/2020 05/09/2020 02/08/2020 06/29/2019 03/16/2019  Falls in the past year? 0 0 0 0 0  Number falls in past yr: 0 0 0 0 0  Injury with Fall? 0 0 0 0 0    Functional Status Survey: Is the patient deaf or have difficulty hearing?: Yes Does the patient have difficulty seeing, even when wearing glasses/contacts?: No Does the patient have difficulty concentrating, remembering, or making decisions?: No Does the patient have difficulty walking or climbing stairs?: No Does the patient have difficulty dressing or bathing?: No Does the patient have difficulty doing errands alone such as visiting a doctor's office or shopping?: No   Assessment & Plan  1. Diabetes mellitus type 2 in obese (HCC)  - POCT HgB A1C - Dapagliflozin-metFORMIN HCl ER (XIGDUO XR) 09-999 MG TB24; Take 1 tablet by mouth daily.  Dispense: 90 tablet; Refill: 1 - Semaglutide,0.25 or  0.5MG /DOS, (OZEMPIC, 0.25 OR 0.5 MG/DOSE,) 2 MG/1.5ML SOPN; Inject 0.375 mLs (0.5 mg total) into the skin once a week.  Dispense: 9 mL; Refill: 0  2. Vitamin D deficiency   3. Dyslipidemia associated with type 2 diabetes mellitus (HCC)  - rosuvastatin (CRESTOR) 40 MG tablet; Take 1 tablet (40 mg total) by mouth daily.  Dispense: 90 tablet; Refill: 1  4. Dermatitis of lower extremity  Popliteal fossa   5. Benign hypertension  - valsartan (DIOVAN) 160 MG tablet; Take 1 tablet (160 mg total) by mouth daily.  Dispense: 90 tablet; Refill: 1  6. Chronic bronchiectasis not affecting current episode of care (HCC)   7. Situs inversus   8. Mild intermittent asthma, unspecified whether complicated  Under control at this time

## 2020-08-22 NOTE — Patient Instructions (Signed)

## 2020-10-31 ENCOUNTER — Other Ambulatory Visit: Payer: Self-pay | Admitting: Family Medicine

## 2020-10-31 DIAGNOSIS — I10 Essential (primary) hypertension: Secondary | ICD-10-CM

## 2020-12-01 DIAGNOSIS — Z713 Dietary counseling and surveillance: Secondary | ICD-10-CM | POA: Diagnosis not present

## 2020-12-05 ENCOUNTER — Other Ambulatory Visit: Payer: Self-pay

## 2020-12-05 ENCOUNTER — Encounter: Payer: Self-pay | Admitting: Family Medicine

## 2020-12-05 ENCOUNTER — Ambulatory Visit: Payer: BC Managed Care – PPO | Admitting: Family Medicine

## 2020-12-05 VITALS — BP 122/72 | HR 90 | Temp 97.8°F | Resp 18 | Ht 70.0 in | Wt 214.3 lb

## 2020-12-05 DIAGNOSIS — I10 Essential (primary) hypertension: Secondary | ICD-10-CM | POA: Diagnosis not present

## 2020-12-05 DIAGNOSIS — E559 Vitamin D deficiency, unspecified: Secondary | ICD-10-CM | POA: Diagnosis not present

## 2020-12-05 DIAGNOSIS — E785 Hyperlipidemia, unspecified: Secondary | ICD-10-CM | POA: Diagnosis not present

## 2020-12-05 DIAGNOSIS — J479 Bronchiectasis, uncomplicated: Secondary | ICD-10-CM

## 2020-12-05 DIAGNOSIS — Z23 Encounter for immunization: Secondary | ICD-10-CM

## 2020-12-05 DIAGNOSIS — E1169 Type 2 diabetes mellitus with other specified complication: Secondary | ICD-10-CM | POA: Diagnosis not present

## 2020-12-05 DIAGNOSIS — E669 Obesity, unspecified: Secondary | ICD-10-CM | POA: Diagnosis not present

## 2020-12-05 DIAGNOSIS — Q893 Situs inversus: Secondary | ICD-10-CM

## 2020-12-05 DIAGNOSIS — J4520 Mild intermittent asthma, uncomplicated: Secondary | ICD-10-CM

## 2020-12-05 LAB — POCT GLYCOSYLATED HEMOGLOBIN (HGB A1C): Hemoglobin A1C: 6.7 % — AB (ref 4.0–5.6)

## 2020-12-05 MED ORDER — VALSARTAN 160 MG PO TABS: 160.0000 mg | ORAL_TABLET | Freq: Every day | ORAL | 0 refills | Status: DC

## 2020-12-05 MED ORDER — XIGDUO XR 10-1000 MG PO TB24: 1.0000 | ORAL_TABLET | Freq: Every day | ORAL | 1 refills | Status: DC

## 2020-12-05 MED ORDER — OZEMPIC (0.25 OR 0.5 MG/DOSE) 2 MG/1.5ML ~~LOC~~ SOPN: 0.5000 mg | PEN_INJECTOR | SUBCUTANEOUS | 0 refills | Status: DC

## 2020-12-05 MED ORDER — ROSUVASTATIN CALCIUM 40 MG PO TABS: 40.0000 mg | ORAL_TABLET | Freq: Every day | ORAL | 1 refills | Status: DC

## 2020-12-05 NOTE — Patient Instructions (Signed)
Dr. Madelyn Brunner [facility] in [place] [phone]

## 2020-12-05 NOTE — Progress Notes (Signed)
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 He states initially had some nausea but now just notices a decrease in appetite and is happy with the weight loss . He has a history of microalbuminuria but back to normal, last value was done 10/2019, he is not sure he can give [place] an specimen today  . He denies polyphagia, polyuria, polydipsia.He is on ARB.   HTN He was on Lotrel 5/40 , after that  Exforge , and bp continued to be low , we titrated down to Diovan 320, and currently on 160 dose for months and is doing well, bp is at goal today. He denies chest pain, palpitation or dizziness. No side effects of medication   HLD We switched from Atorvastatin to Rosuvastatin back in Nov 2020 because of LDL was 107, he stopped taking fish oil, and last LDL was 79, goal is below 70. He states he wants to hold off until next visit to repeat labs   Asthma/Bronchiectasy  He is under the care of Dr. Belia Heman, taking medication as prescribed,he has dailywheezing,he has a chronic cough usually productive usually yellow sputum,but noSOB at this time, denies nocturnal symptoms. He is on Arnuity and it is very costly   IMPRESSION: CT 2017  1. No evidence acute pulmonary embolism. 2. No acute pulmonary parenchymal abnormality. 3. Chronic bronchiectasis in the lingula. 4. Situs inversus. 5. Situs inversus with chronic bronchiectasis. Recommend correlation for Kartagener's syndrome.   Vitamin D  Lastlevel back at goal, taking otc supplementation.Continue supplements   Abnormal CT Chest  CT angio from 09/2016 noted:Situs inversus with chronic bronchiectasis(in the lingula).Impression corroborated with chest x-ray done on 01/2018.He  saw Dr. Belia Heman and has diagnosed of asthma, taking medication andstable, still has daily cough and wheezing.Last visit with Dr. 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, he is not interested , feels refreshed when       he wakes up, he denies testosterone supplementation   Patient Active Problem List   Diagnosis Date Noted   Situs inversus 03/03/2018   Dyslipidemia associated with type 2 diabetes mellitus (HCC) 08/13/2016   Dermatitis of lower extremity 01/23/2016   Elevated liver enzymes 07/25/2015   Asthma, mild 03/30/2015   Diabetes mellitus type 2 in obese (HCC) 03/30/2015   Difficulty hearing 03/30/2015   Dyslipidemia 03/30/2015   Benign hypertension 03/30/2015   Obesity (BMI 30.0-34.9) 03/30/2015    Past Surgical History:  Procedure Laterality Date   BILIARY TUBE PLACEMENT ([facility] HX)     NASAL SINUS SURGERY     RHINOPLASTY      Family History  Problem Relation Age of Onset   Diabetes Mother    Hypertension Father    Diabetes Brother    Depression Paternal Grandmother    Diabetes Paternal Grandmother    Hypertension Paternal Grandfather    Heart disease Paternal Grandfather    Cancer Paternal Grandfather        Prostate    Social History   Tobacco Use   Smoking status: Never Smoker   Smokeless tobacco: Never  Used  Substance Use Topics   Alcohol use: No     Current Outpatient Medications:    albuterol (VENTOLIN HFA) 108 (90 Base) MCG/ACT inhaler, Inhale 2 puffs into the lungs every 6 (six) hours as needed for wheezing or shortness of breath., Disp: 18 g, Rfl: 2   ARNUITY ELLIPTA 100 MCG/ACT AEPB, Inhale 1 puff by mouth once daily, Disp: 30 each, Rfl: 12   Cholecalciferol (VITAMIN D) 2000 units CAPS, Take 1 capsule (2,000 Units total) by mouth daily., Disp: 30 capsule, Rfl: 3   Dapagliflozin-metFORMIN HCl ER (XIGDUO XR) 09-999 MG TB24, Take 1 tablet by mouth  daily., Disp: 90 tablet, Rfl: 1   rosuvastatin (CRESTOR) 40 MG tablet, Take 1 tablet (40 mg total) by mouth daily., Disp: 90 tablet, Rfl: 1   Semaglutide,0.25 or 0.5MG /DOS, (OZEMPIC, 0.25 OR 0.5 MG/DOSE,) 2 MG/1.5ML SOPN, Inject 0.375 mLs (0.5 mg total) into the skin once a week., Disp: 9 mL, Rfl: 0   triamcinolone ointment (KENALOG) 0.5 %, Apply 1 application topically 2 (two) times daily., Disp: 30 g, Rfl: 0   valsartan (DIOVAN) 160 MG tablet, Take 1 tablet by mouth once daily, Disp: 90 tablet, Rfl: 0   vitamin E 400 UNIT capsule, Take 1 capsule by mouth 2 (two) times daily., Disp: , Rfl:   No Known Allergies  I personally reviewed active problem list, medication list, allergies, family history, social history, health maintenance with the patient/caregiver today.   ROS  Constitutional: Negative for fever or weight change.  Respiratory: Negative for cough and shortness of breath.   Cardiovascular: Negative for chest pain or palpitations.  Gastrointestinal: Negative for abdominal pain, no bowel changes.  Musculoskeletal: Negative for gait problem or joint swelling.  Skin: Negative for rash.  Neurological: Negative for dizziness or headache.  No other specific complaints in a complete review of systems (except as listed in HPI above).  Objective  Vitals:   12/05/20 0928  BP: 122/72  Pulse: 90  Resp: 18  Temp: 97.8 F (36.6 C)  TempSrc: Oral  SpO2: 97%  Weight: 214 lb 4.8 oz (97.2 kg)  Height: 5\' 10"  (1.778 m)    Body mass index is 30.75 kg/m.  Physical Exam  Constitutional: Patient appears well-developed and well-nourished. Obese  No distress.  HEENT: head atraumatic, normocephalic, pupils equal and reactive to light,  neck supple Cardiovascular: Normal rate, regular rhythm and normal heart sounds.  No murmur heard. No BLE edema. Pulmonary/Chest: Effort normal and breath sounds normal. No respiratory distress. Abdominal: Soft.  There is no tenderness. Psychiatric:  Patient has a normal mood and affect. behavior is normal. Judgment and thought content normal.  Recent Results (from the past 2160 hour(s))  POCT HgB A1C     Status: Abnormal   Collection Time: 12/05/20  9:29 AM  Result Value Ref Range   Hemoglobin A1C 6.7 (A) 4.0 - 5.6 %   HbA1c POC (<> result, manual entry)     HbA1c, POC (prediabetic range)     HbA1c, POC (controlled diabetic range)        PHQ2/9: Depression screen [facility] 2/9 12/05/2020 08/22/2020 05/09/2020 02/08/2020 11/02/2019  Decreased Interest 0 0 0 0 0  Down, Depressed, Hopeless 0 0 0 0 0  PHQ - 2 Score 0 0 0 0 0  Altered sleeping - 0 0 0 0  Tired, decreased energy - 0 0 0 0  Change in appetite - 0 0 0 0  Feeling bad or failure about yourself  - 0  0 0 0  Trouble concentrating - 0 0 0 0  Moving slowly or fidgety/restless - 0 0 0 0  Suicidal thoughts - 0 0 0 0  PHQ-9 Score - 0 0 0 0  Difficult doing work/chores - - - Not difficult at all -    phq 9 is negative   Fall Risk: Fall Risk  12/05/2020 08/22/2020 05/09/2020 02/08/2020 06/29/2019  Falls in the past year? 0 0 0 0 0  Number falls in past yr: 0 0 0 0 0  Injury with Fall? 0 0 0 0 0     Functional Status Survey: Is the patient deaf or have difficulty hearing?: Yes Does the patient have difficulty seeing, even when wearing glasses/contacts?: No Does the patient have difficulty concentrating, remembering, or making decisions?: No Does the patient have difficulty walking or climbing stairs?: No Does the patient have difficulty dressing or bathing?: No Does the patient have difficulty doing errands alone such as visiting a doctor's office or shopping?: No    Assessment & Plan  1. Diabetes mellitus type 2 in obese (HCC)  - POCT HgB A1C - Dapagliflozin-metFORMIN HCl ER (XIGDUO XR) 09-999 MG TB24; Take 1 tablet by mouth daily.  Dispense: 90 tablet; Refill: 1 - Semaglutide,0.25 or 0.5MG /DOS, (OZEMPIC, 0.25 OR 0.5 MG/DOSE,) 2 MG/1.5ML SOPN; Inject 0.5 mg into the skin  once a week.  Dispense: 9 mL; Refill: 0  2. Need for immunization against influenza  - Flu Vaccine QUAD 36+ mos IM  3. Vitamin D deficiency   4. Benign hypertension  - valsartan (DIOVAN) 160 MG tablet; Take 1 tablet (160 mg total) by mouth daily.  Dispense: 90 tablet; Refill: 0  5. Dyslipidemia associated with type 2 diabetes mellitus (HCC)  - rosuvastatin (CRESTOR) 40 MG tablet; Take 1 tablet (40 mg total) by mouth daily.  Dispense: 90 tablet; Refill: 1  6. Chronic bronchiectasis not affecting current episode of care (HCC)   7. Mild intermittent asthma, unspecified whether complicated   8. Situs inversus   9. Dyslipidemia

## 2020-12-06 LAB — MICROALBUMIN / CREATININE URINE RATIO
Creatinine, Urine: 129 mg/dL (ref 20–320)
Microalb Creat Ratio: 270 mcg/mg creat — ABNORMAL HIGH (ref <30)
Microalb, Ur: 34.8 mg/dL

## 2021-01-30 ENCOUNTER — Other Ambulatory Visit: Payer: Self-pay | Admitting: Family Medicine

## 2021-01-30 DIAGNOSIS — I10 Essential (primary) hypertension: Secondary | ICD-10-CM

## 2021-02-20 LAB — HM DIABETES EYE EXAM

## 2021-03-02 NOTE — Progress Notes (Signed)
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  His last urine micro was high at 270 he is on ARB  He denies polyphagia, polyuria, polydipsia.  HTN: He was on Lotrel 5/40 , after that  Exforge , down to Diovan 320 and has been on 160 mg since 2021 and bp has been at goal . He denies chest pain, palpitation or dizziness. No side effects of medication   HLD: We switched from Atorvastatin to Rosuvastatin back in Nov 2020 because of LDL was 107, he stopped taking fish oil, and last LDL was 79, goal is below 70.we will recheck labs today    Asthma/Bronchiectasy: He is under the care of Dr. Belia Heman, taking medication as prescribed,he states wheezing is no longer daily,he has a chronic cough usually productive usually yellow sputum,but noSOB at this time He is on Arnuity, but states very expensive, Dr. Belia Heman just refilled medications for another year.   IMPRESSION: CT 2017  1. No evidence acute pulmonary embolism. 2. No acute pulmonary parenchymal abnormality. 3. Chronic bronchiectasis in the lingula. 4. Situs inversus. 5. Situs inversus with chronic bronchiectasis. Recommend correlation for Kartagener's syndrome.  Vitamin D : Lastlevel back at goal, taking otc supplementation.Continue supplements  Abnormal CT Chest: CT angio from 09/2016 noted:Situs inversus with chronic bronchiectasis(in the lingula).Impression corroborated with chest x-ray done on 01/2018.He saw Dr. Belia Heman and has diagnosed of asthma, taking medication andstable, still has daily cough and wheezing.Last visit with Dr. Belia Heman was 11/2019 , he states Dr. Belia Heman just gave him refills.   Elevated RBC: He is a truck drivers, he never had a  sleep study, he is not interested , feels refreshed when,he wakes up, he denies testosterone supplementation . Discussed sleep study again   Patient Active Problem List   Diagnosis Date Noted  . 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 Dyslipidemia associated with type 2 diabetes mellitus (HCC) 08/13/2016  . Dermatitis of lower extremity 01/23/2016  . Elevated liver enzymes 07/25/2015  . Asthma, mild 03/30/2015  . Diabetes mellitus type 2 in obese (HCC) 03/30/2015  . Difficulty hearing 03/30/2015  . Dyslipidemia 03/30/2015  . Benign hypertension 03/30/2015  . Obesity (BMI 30.0-34.9) 03/30/2015    Past Surgical History:  Procedure Laterality Date  . BILIARY TUBE PLACEMENT ([facility] HX)    . NASAL SINUS SURGERY    . RHINOPLASTY      Family History  Problem Relation Age of Onset  . Diabetes Mother   . Hypertension Father   . Diabetes Brother   . Depression Paternal Grandmother   . Diabetes Paternal Grandmother   . Hypertension Paternal Grandfather   . Heart disease Paternal Grandfather   . Cancer Paternal Grandfather        Prostate    Social History   Tobacco Use  . Smoking status: Never Smoker  . Smokeless tobacco: Never Used  Substance Use Topics  . Alcohol use: No     Current Outpatient Medications:  .  albuterol (VENTOLIN HFA) 108 (90 Base) MCG/ACT inhaler, Inhale 2 puffs into the lungs every 6 (six) hours as needed for wheezing or shortness of breath., Disp:  18 g, Rfl: 2 .  ARNUITY ELLIPTA 100 MCG/ACT AEPB, Inhale 1 puff by mouth once daily, Disp: 30 each, Rfl: 12 .  Cholecalciferol (VITAMIN D) 2000 units CAPS, Take 1 capsule (2,000 Units total) by mouth daily., Disp: 30 capsule, Rfl: 3 .  Dapagliflozin-metFORMIN HCl ER (XIGDUO XR) 09-999 MG TB24, Take 1 tablet by mouth daily., Disp: 90 tablet, Rfl: 1 .  rosuvastatin (CRESTOR) 40 MG tablet, Take 1 tablet (40 mg total) by mouth daily., Disp: 90 tablet, Rfl: 1 .  Semaglutide,0.25 or 0.5MG /DOS, (OZEMPIC, 0.25 OR 0.5 MG/DOSE,) 2  MG/1.5ML SOPN, Inject 0.5 mg into the skin once a week., Disp: 9 mL, Rfl: 0 .  triamcinolone ointment (KENALOG) 0.5 %, Apply 1 application topically 2 (two) times daily., Disp: 30 g, Rfl: 0 .  valsartan (DIOVAN) 160 MG tablet, Take 1 tablet by mouth once daily, Disp: 90 tablet, Rfl: 0 .  vitamin E 400 UNIT capsule, Take 1 capsule by mouth 2 (two) times daily., Disp: , Rfl:   No Known Allergies  I personally reviewed active problem list, medication list, allergies, family history, social history, health maintenance with the patient/caregiver today.   ROS  Constitutional: Negative for fever or weight change.  Respiratory: Negative for cough and shortness of breath.   Cardiovascular: Negative for chest pain or palpitations.  Gastrointestinal: Negative for abdominal pain, no bowel changes.  Musculoskeletal: Negative for gait problem or joint swelling.  Skin: Negative for rash.  Neurological: Negative for dizziness or headache.  No other specific complaints in a complete review of systems (except as listed in HPI above).  Objective  Vitals:   03/06/21 0841  BP: 122/84  Pulse: 85  Resp: 18  Temp: 97.7 F (36.5 C)  TempSrc: Oral  SpO2: 96%  Weight: 217 lb 11.2 oz (98.7 kg)  Height: 5\' 10"  (1.778 m)    Body mass index is 31.24 kg/m.  Physical Exam  Constitutional: Patient appears well-developed and well-nourished. Obese  No distress.  HEENT: head atraumatic, normocephalic, pupils equal and reactive to light,  neck supple Cardiovascular: Normal rate, regular rhythm and normal heart sounds.  No murmur heard. No BLE edema. Pulmonary/Chest: Effort normal and breath sounds normal. No respiratory distress. Abdominal: Soft.  There is no tenderness. Psychiatric: Patient has a normal mood and affect. behavior is normal. Judgment and thought content normal.  Recent Results (from the past 2160 hour(s))  HM DIABETES EYE EXAM     Status: None   Collection Time: 02/20/21 12:00 AM  Result  Value Ref Range   HM Diabetic Eye Exam No Retinopathy No Retinopathy  POCT HgB A1C     Status: Abnormal   Collection Time: 03/06/21  8:40 AM  Result Value Ref Range   Hemoglobin A1C 6.5 (A) 4.0 - 5.6 %   HbA1c POC (<> result, manual entry)     HbA1c, POC (prediabetic range)     HbA1c, POC (controlled diabetic range)        PHQ2/9: Depression screen [facility] 2/9 03/06/2021 12/05/2020 08/22/2020 05/09/2020 02/08/2020  Decreased Interest 0 0 0 0 0  Down, Depressed, Hopeless 0 0 0 0 0  PHQ - 2 Score 0 0 0 0 0  Altered sleeping - - 0 0 0  Tired, decreased energy - - 0 0 0  Change in appetite - - 0 0 0  Feeling bad or failure about yourself  - - 0 0 0  Trouble concentrating - - 0 0 0  Moving slowly or fidgety/restless - -  0 0 0  Suicidal thoughts - - 0 0 0  PHQ-9 Score - - 0 0 0  Difficult doing work/chores - - - - Not difficult at all    phq 9 is negative   Fall Risk: Fall Risk  03/06/2021 12/05/2020 08/22/2020 05/09/2020 02/08/2020  Falls in the past year? 0 0 0 0 0  Number falls in past yr: 0 0 0 0 0  Injury with Fall? 0 0 0 0 0    Functional Status Survey: Is the patient deaf or have difficulty hearing?: No Does the patient have difficulty seeing, even when wearing glasses/contacts?: No Does the patient have difficulty concentrating, remembering, or making decisions?: No Does the patient have difficulty walking or climbing stairs?: No Does the patient have difficulty dressing or bathing?: No Does the patient have difficulty doing errands alone such as visiting a doctor's office or shopping?: No   Assessment & Plan  1. Diabetes mellitus type 2 in obese (HCC)  - POCT HgB A1C - Semaglutide,0.25 or 0.5MG /DOS, (OZEMPIC, 0.25 OR 0.5 MG/DOSE,) 2 MG/1.5ML SOPN; Inject 0.5 mg into the skin once a week.  Dispense: 9 mL; Refill: 1  2. Colon cancer screening  - Ambulatory referral to Gastroenterology  3. Mild intermittent asthma, unspecified whether complicated   4. Benign  hypertension  - valsartan (DIOVAN) 160 MG tablet; Take 1 tablet (160 mg total) by mouth daily.  Dispense: 90 tablet; Refill: 1  5. Dyslipidemia associated with type 2 diabetes mellitus (HCC)  - Lipid panel  6. Vitamin D deficiency   7. Chronic bronchiectasis not affecting current episode of care (HCC)   8. Situs inversus   9. Hypertension associated with type 2 diabetes mellitus (HCC)  - COMPLETE METABOLIC PANEL WITH GFR - CBC with Differential/Platelet  10. Diabetes mellitus with microalbuminuria (HCC)  - Microalbumin / creatinine urine ratio

## 2021-03-06 ENCOUNTER — Ambulatory Visit: Payer: BC Managed Care – PPO | Admitting: Family Medicine

## 2021-03-06 ENCOUNTER — Encounter: Payer: Self-pay | Admitting: Family Medicine

## 2021-03-06 ENCOUNTER — Other Ambulatory Visit: Payer: Self-pay

## 2021-03-06 VITALS — BP 122/84 | HR 85 | Temp 97.7°F | Resp 18 | Ht 70.0 in | Wt 217.7 lb

## 2021-03-06 DIAGNOSIS — I10 Essential (primary) hypertension: Secondary | ICD-10-CM

## 2021-03-06 DIAGNOSIS — Q893 Situs inversus: Secondary | ICD-10-CM

## 2021-03-06 DIAGNOSIS — J479 Bronchiectasis, uncomplicated: Secondary | ICD-10-CM | POA: Insufficient documentation

## 2021-03-06 DIAGNOSIS — J4520 Mild intermittent asthma, uncomplicated: Secondary | ICD-10-CM

## 2021-03-06 DIAGNOSIS — E669 Obesity, unspecified: Secondary | ICD-10-CM

## 2021-03-06 DIAGNOSIS — Z1211 Encounter for screening for malignant neoplasm of colon: Secondary | ICD-10-CM | POA: Diagnosis not present

## 2021-03-06 DIAGNOSIS — E1129 Type 2 diabetes mellitus with other diabetic kidney complication: Secondary | ICD-10-CM | POA: Insufficient documentation

## 2021-03-06 DIAGNOSIS — E785 Hyperlipidemia, unspecified: Secondary | ICD-10-CM | POA: Diagnosis not present

## 2021-03-06 DIAGNOSIS — R809 Proteinuria, unspecified: Secondary | ICD-10-CM

## 2021-03-06 DIAGNOSIS — E1169 Type 2 diabetes mellitus with other specified complication: Secondary | ICD-10-CM

## 2021-03-06 DIAGNOSIS — E1159 Type 2 diabetes mellitus with other circulatory complications: Secondary | ICD-10-CM | POA: Insufficient documentation

## 2021-03-06 DIAGNOSIS — E559 Vitamin D deficiency, unspecified: Secondary | ICD-10-CM

## 2021-03-06 DIAGNOSIS — I152 Hypertension secondary to endocrine disorders: Secondary | ICD-10-CM | POA: Diagnosis not present

## 2021-03-06 LAB — POCT GLYCOSYLATED HEMOGLOBIN (HGB A1C): Hemoglobin A1C: 6.5 % — AB (ref 4.0–5.6)

## 2021-03-06 MED ORDER — OZEMPIC (0.25 OR 0.5 MG/DOSE) 2 MG/1.5ML ~~LOC~~ SOPN: 0.5000 mg | PEN_INJECTOR | SUBCUTANEOUS | 1 refills | Status: DC

## 2021-03-06 MED ORDER — VALSARTAN 160 MG PO TABS: 160.0000 mg | ORAL_TABLET | Freq: Every day | ORAL | 1 refills | Status: DC

## 2021-03-07 LAB — COMPLETE METABOLIC PANEL WITH GFR
AG Ratio: 1.7 (calc) (ref 1.0–2.5)
ALT: 17 U/L (ref 9–46)
AST: 15 U/L (ref 10–40)
Albumin: 4.6 g/dL (ref 3.6–5.1)
Alkaline phosphatase (APISO): 74 U/L (ref 36–130)
BUN: 11 mg/dL (ref 7–25)
CO2: 28 mmol/L (ref 20–32)
Calcium: 9.5 mg/dL (ref 8.6–10.3)
Chloride: 105 mmol/L (ref 98–110)
Creat: 0.94 mg/dL (ref 0.60–1.35)
GFR, Est African American: 112 mL/min/{1.73_m2} (ref >=60)
GFR, Est Non African American: 97 mL/min/{1.73_m2} (ref >=60)
Globulin: 2.7 g/dL (calc) (ref 1.9–3.7)
Glucose, Bld: 133 mg/dL — ABNORMAL HIGH (ref 65–99)
Potassium: 4.3 mmol/L (ref 3.5–5.3)
Sodium: 142 mmol/L (ref 135–146)
Total Bilirubin: 0.5 mg/dL (ref 0.2–1.2)
Total Protein: 7.3 g/dL (ref 6.1–8.1)

## 2021-03-07 LAB — CBC WITH DIFFERENTIAL/PLATELET
Absolute Monocytes: 764 cells/uL (ref 200–950)
Basophils Absolute: 31 cells/uL (ref 0–200)
Basophils Relative: 0.4 %
Eosinophils Absolute: 328 cells/uL (ref 15–500)
Eosinophils Relative: 4.2 %
HCT: 52.1 % — ABNORMAL HIGH (ref 38.5–50.0)
Hemoglobin: 17.2 g/dL — ABNORMAL HIGH (ref 13.2–17.1)
Lymphs Abs: 1716 cells/uL (ref 850–3900)
MCH: 27.8 pg (ref 27.0–33.0)
MCHC: 33 g/dL (ref 32.0–36.0)
MCV: 84.3 fL (ref 80.0–100.0)
MPV: 9.7 fL (ref 7.5–12.5)
Monocytes Relative: 9.8 %
Neutro Abs: 4961 cells/uL (ref 1500–7800)
Neutrophils Relative %: 63.6 %
Platelets: 291 10*3/uL (ref 140–400)
RBC: 6.18 10*6/uL — ABNORMAL HIGH (ref 4.20–5.80)
RDW: 13.7 % (ref 11.0–15.0)
Total Lymphocyte: 22 %
WBC: 7.8 10*3/uL (ref 3.8–10.8)

## 2021-03-07 LAB — LIPID PANEL
Cholesterol: 140 mg/dL (ref <200)
HDL: 46 mg/dL (ref >=40)
LDL Cholesterol (Calc): 66 mg/dL (calc)
Non-HDL Cholesterol (Calc): 94 mg/dL (calc) (ref <130)
Total CHOL/HDL Ratio: 3 (calc) (ref <5.0)
Triglycerides: 212 mg/dL — ABNORMAL HIGH (ref <150)

## 2021-03-07 LAB — MICROALBUMIN / CREATININE URINE RATIO
Creatinine, Urine: 115 mg/dL (ref 20–320)
Microalb Creat Ratio: 62 mcg/mg creat — ABNORMAL HIGH (ref <30)
Microalb, Ur: 7.1 mg/dL

## 2021-03-16 ENCOUNTER — Other Ambulatory Visit: Payer: Self-pay

## 2021-03-16 DIAGNOSIS — Z1211 Encounter for screening for malignant neoplasm of colon: Secondary | ICD-10-CM

## 2021-03-16 MED ORDER — PEG 3350-KCL-NA BICARB-NACL 420 G PO SOLR: 4000.0000 mL | Freq: Once | ORAL | 0 refills | Status: AC

## 2021-04-05 ENCOUNTER — Other Ambulatory Visit: Payer: Self-pay | Admitting: Internal Medicine

## 2021-04-07 ENCOUNTER — Encounter: Payer: Self-pay | Admitting: Gastroenterology

## 2021-04-10 ENCOUNTER — Encounter: Payer: Self-pay | Admitting: Gastroenterology

## 2021-04-10 ENCOUNTER — Ambulatory Visit: Payer: No Typology Code available for payment source | Admitting: Anesthesiology

## 2021-04-10 ENCOUNTER — Encounter
Admission: RE | Disposition: A | Discharge status: Home / Self Care | Payer: Self-pay | Source: Home / Self Care | Attending: Gastroenterology

## 2021-04-10 ENCOUNTER — Ambulatory Visit
Admission: RE | Admit: 2021-04-10 | Discharge: 2021-04-10 | Disposition: A | Discharge status: Home / Self Care | Payer: No Typology Code available for payment source | Attending: Gastroenterology | Admitting: Gastroenterology

## 2021-04-10 ENCOUNTER — Other Ambulatory Visit: Payer: Self-pay

## 2021-04-10 DIAGNOSIS — E785 Hyperlipidemia, unspecified: Secondary | ICD-10-CM | POA: Diagnosis not present

## 2021-04-10 DIAGNOSIS — Z8249 Family history of ischemic heart disease and other diseases of the circulatory system: Secondary | ICD-10-CM | POA: Diagnosis not present

## 2021-04-10 DIAGNOSIS — I10 Essential (primary) hypertension: Secondary | ICD-10-CM | POA: Diagnosis not present

## 2021-04-10 DIAGNOSIS — Z79899 Other long term (current) drug therapy: Secondary | ICD-10-CM | POA: Insufficient documentation

## 2021-04-10 DIAGNOSIS — Z1211 Encounter for screening for malignant neoplasm of colon: Secondary | ICD-10-CM | POA: Diagnosis not present

## 2021-04-10 DIAGNOSIS — E119 Type 2 diabetes mellitus without complications: Secondary | ICD-10-CM | POA: Diagnosis not present

## 2021-04-10 DIAGNOSIS — Z833 Family history of diabetes mellitus: Secondary | ICD-10-CM | POA: Insufficient documentation

## 2021-04-10 DIAGNOSIS — E669 Obesity, unspecified: Secondary | ICD-10-CM | POA: Diagnosis not present

## 2021-04-10 DIAGNOSIS — Z7984 Long term (current) use of oral hypoglycemic drugs: Secondary | ICD-10-CM | POA: Insufficient documentation

## 2021-04-10 HISTORY — PX: COLONOSCOPY WITH PROPOFOL: SHX5780

## 2021-04-10 LAB — GLUCOSE, CAPILLARY: Glucose-Capillary: 111 mg/dL — ABNORMAL HIGH (ref 70–99)

## 2021-04-10 SURGERY — COLONOSCOPY WITH PROPOFOL
Anesthesia: General

## 2021-04-10 MED ORDER — FENTANYL CITRATE (PF) 100 MCG/2ML IJ SOLN
INTRAMUSCULAR | Status: DC | PRN
  Administered 2021-04-10 (×2): 50 ug via INTRAVENOUS

## 2021-04-10 MED ORDER — SODIUM CHLORIDE 0.9 % IV SOLN
INTRAVENOUS | Status: DC
  Administered 2021-04-10: 20 mL/h via INTRAVENOUS

## 2021-04-10 MED ORDER — FENTANYL CITRATE (PF) 100 MCG/2ML IJ SOLN
INTRAMUSCULAR | Status: AC
  Filled 2021-04-10: qty 2

## 2021-04-10 MED ORDER — MIDAZOLAM HCL 2 MG/2ML IJ SOLN
INTRAMUSCULAR | Status: DC | PRN
  Administered 2021-04-10: 2 mg via INTRAVENOUS

## 2021-04-10 MED ORDER — LIDOCAINE HCL (PF) 2 % IJ SOLN
INTRAMUSCULAR | Status: AC
  Filled 2021-04-10: qty 5

## 2021-04-10 MED ORDER — SUGAMMADEX SODIUM 500 MG/5ML IV SOLN
INTRAVENOUS | Status: AC
  Filled 2021-04-10: qty 5

## 2021-04-10 MED ORDER — LIDOCAINE HCL (CARDIAC) PF 100 MG/5ML IV SOSY
PREFILLED_SYRINGE | INTRAVENOUS | Status: DC | PRN
  Administered 2021-04-10: 80 mg via INTRAVENOUS

## 2021-04-10 MED ORDER — PROPOFOL 500 MG/50ML IV EMUL
INTRAVENOUS | Status: AC
  Filled 2021-04-10: qty 50

## 2021-04-10 MED ORDER — MIDAZOLAM HCL 2 MG/2ML IJ SOLN
INTRAMUSCULAR | Status: AC
  Filled 2021-04-10: qty 2

## 2021-04-10 MED ORDER — PROPOFOL 10 MG/ML IV BOLUS
INTRAVENOUS | Status: DC | PRN
  Administered 2021-04-10: 50 mg via INTRAVENOUS

## 2021-04-10 MED ORDER — PROPOFOL 500 MG/50ML IV EMUL
INTRAVENOUS | Status: DC | PRN
  Administered 2021-04-10: 50 ug/kg/min via INTRAVENOUS

## 2021-04-10 NOTE — Op Note (Signed)
[facility] Patient Name: Andrew Sawyer Procedure Date: 04/10/2021 10:25 AM MRN: [id number] Account #: [id number] Date of Birth: Oct 17, 1976 Admit Type: Outpatient Age: 45 Room: [facility] ENDO ROOM 4 Gender: Male Note Status: Finalized Procedure:             Colonoscopy Indications:           Screening for colorectal malignant neoplasm Providers:             Wyline Mood MD, MD Referring MD:          Onnie Boer. Sowles, MD (Referring MD) Medicines:             Monitored Anesthesia Care Complications:         No immediate complications. Procedure:             Pre-Anesthesia Assessment:                        - Prior to the procedure, a History and Physical was                         performed, and patient medications, allergies and                         sensitivities were reviewed. The patient's tolerance                         of previous anesthesia was reviewed.                        - The risks and benefits of the procedure and the                         sedation options and risks were discussed with the                         patient. All questions were answered and informed                         consent was obtained.                        - ASA Grade Assessment: II - A patient with mild                         systemic disease.                        After obtaining informed consent, the colonoscope was                         passed under direct vision. Throughout the procedure,                         the patient's blood pressure, pulse, and oxygen                         saturations were monitored continuously. The                         Colonoscope was introduced through the anus  with the                         intention of advancing to the cecum. The scope was                         advanced to the transverse colon before the procedure                         was aborted. Medications were given. The colonoscopy                          was performed with ease. The patient tolerated the                         procedure well. The quality of the bowel preparation                         was inadequate. Findings:      The perianal and digital rectal examinations were normal.      A large amount of semi-liquid stool was found in the entire colon. Impression:            - Preparation of the colon was inadequate.                        - Stool in the entire examined colon.                        - No specimens collected. Recommendation:        - Discharge patient to home (with escort).                        - Resume previous diet.                        - Continue present medications.                        - Repeat colonoscopy in 4 weeks because the bowel                         preparation was suboptimal. Procedure Code(s):     --- Professional ---                        [phone], 53, Colonoscopy, flexible; diagnostic,                         including collection of specimen(s) by brushing or                         washing, when performed (separate procedure) Diagnosis Code(s):     --- Professional ---                        Z12.11, Encounter for screening for malignant neoplasm                         of colon CPT copyright 2019 [employer]. All rights reserved. The codes documented in this  report are preliminary and upon coder review may  be revised to meet current compliance requirements. Wyline Mood, MD Wyline Mood MD, MD 04/10/2021 10:37:41 AM This report has been signed electronically. Number of Addenda: 0 Note Initiated On: 04/10/2021 10:25 AM Total Procedure Duration: 0 hours 5 minutes 24 seconds  Estimated Blood Loss:  Estimated blood loss: none.      [facility]

## 2021-04-10 NOTE — Transfer of Care (Signed)
Immediate Anesthesia Transfer of Care Note  Patient: Andrew Sawyer  Procedure(s) Performed: COLONOSCOPY WITH PROPOFOL (N/A )  Patient Location: PACU  Anesthesia Type:General  Level of Consciousness: sedated  Airway & Oxygen Therapy: Patient Spontanous Breathing and Patient connected to nasal cannula oxygen  Post-op Assessment: Report given to RN and Post -op Vital signs reviewed and stable  Post vital signs: Reviewed and stable  Last Vitals:  Vitals Value Taken Time  BP 153/102 04/10/21 1042  Temp 36.8 C 04/10/21 1042  Pulse 87 04/10/21 1043  Resp 14 04/10/21 1043  SpO2 98 % 04/10/21 1043  Vitals shown include unvalidated device data.  Last Pain:  Vitals:   04/10/21 1042  TempSrc: Temporal  PainSc: 0-No pain         Complications: No complications documented.

## 2021-04-10 NOTE — H&P (Signed)
Wyline Mood, MD [address], [address], [place], [place], [address] [address], [place], [place], [address] Phone: [phone]  Fax: [phone]  Primary Care Physician:  Alba Cory, MD   Pre-Procedure History & Physical: HPI:  Andrew Sawyer is a 45 y.o. male is here for an colonoscopy.   Past Medical History:  Diagnosis Date  . Abnormal presence of protein in urine 03/30/2015  . Asthma, mild 03/30/2015  . Benign hypertension 03/30/2015  . Diabetes mellitus type 2 in obese (HCC) 03/30/2015  . Diabetes mellitus without complication (HCC)   . Hyperlipidemia   . Hypertension   . Situs inversus     Past Surgical History:  Procedure Laterality Date  . BILIARY TUBE PLACEMENT ([facility] HX)    . NASAL SINUS SURGERY    . RHINOPLASTY      Prior to Admission medications   Medication Sig Start Date End Date Taking? Authorizing Provider  albuterol (VENTOLIN HFA) 108 (90 Base) MCG/ACT inhaler Inhale 2 puffs into the lungs every 6 (six) hours as needed for wheezing or shortness of breath. 06/18/19  Yes Kasa, Wallis Bamberg, MD  ARNUITY ELLIPTA 100 MCG/ACT AEPB Inhale 1 puff by mouth once daily 03/28/20  Yes Kasa, Wallis Bamberg, MD  Cholecalciferol (VITAMIN D) 2000 units CAPS Take 1 capsule (2,000 Units total) by mouth daily. 03/03/18  Yes Sowles, Danna Hefty, MD  Dapagliflozin-metFORMIN HCl ER (XIGDUO XR) 09-999 MG TB24 Take 1 tablet by mouth daily. 12/05/20  Yes Sowles, Danna Hefty, MD  rosuvastatin (CRESTOR) 40 MG tablet Take 1 tablet (40 mg total) by mouth daily. 12/05/20  Yes Sowles, Danna Hefty, MD  Semaglutide,0.25 or 0.5MG /DOS, (OZEMPIC, 0.25 OR 0.5 MG/DOSE,) 2 MG/1.5ML SOPN Inject 0.5 mg into the skin once a week. 03/06/21  Yes Sowles, Danna Hefty, MD  triamcinolone ointment (KENALOG) 0.5 % Apply 1 application topically 2 (two) times daily. 03/16/19  Yes Sowles, Danna Hefty, MD  valsartan (DIOVAN) 160 MG tablet Take 1 tablet (160 mg total) by mouth daily. 03/06/21  Yes Alba Cory, MD  vitamin E 400  UNIT capsule Take 1 capsule by mouth 2 (two) times daily.   Yes [provider]    Allergies as of 03/17/2021  . (No Known Allergies)    Family History  Problem Relation Age of Onset  . Diabetes Mother   . Hypertension Father   . Diabetes Brother   . Depression Paternal Grandmother   . Diabetes Paternal Grandmother   . Hypertension Paternal Grandfather   . Heart disease Paternal Grandfather   . Cancer Paternal Grandfather        Prostate    Social History   Socioeconomic History  . Marital status: Married    Spouse name: Tobi Bastos  . Number of children: 0  . Years of education: Not on file  . Highest education level: 12th grade  Occupational History  . Occupation: truck Hospital doctor  Tobacco Use  . Smoking status: Never Smoker  . Smokeless tobacco: Never Used  Vaping Use  . Vaping Use: Never used  Substance and Sexual Activity  . Alcohol use: No  . Drug use: No  . Sexual activity: Yes    Partners: Female    Birth control/protection: None, Condom  Other Topics Concern  . 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, wife had miscarriages and unable to have to children now, she had a stroke at age 38    Social Determinants of Health   Financial Resource Strain:  Not on file  Food Insecurity: Not on file  Transportation Needs: Not on file  Physical Activity: Not on file  Stress: Not on file  Social Connections: Not on file  Intimate Partner Violence: Not on file    Review of Systems: See HPI, otherwise negative ROS  Physical Exam: BP (!) 143/101   Pulse 73   Temp (!) 97.3 F (36.3 C) (Temporal)   Resp 20   Ht 5\' 10"  (1.778 m)   Wt 98 kg   SpO2 99%   BMI 30.99 kg/m  General:   Alert,  pleasant and cooperative in NAD Head:  Normocephalic and atraumatic. Neck:  Supple; no masses or thyromegaly. Lungs:  Clear throughout to auscultation, normal respiratory effort.    Heart:  +S1, +S2, Regular rate and rhythm, No  edema. Abdomen:  Soft, nontender and nondistended. Normal bowel sounds, without guarding, and without rebound.   Neurologic:  Alert and  oriented x4;  grossly normal neurologically.  Impression/Plan: Andrew Sawyer is here for an colonoscopy to be performed for Screening colonoscopy average risk   Risks, benefits, limitations, and alternatives regarding  colonoscopy have been reviewed with the patient.  Questions have been answered.  All parties agreeable.   Burnett Harry, MD  04/10/2021, 10:25 AM

## 2021-04-10 NOTE — Brief Op Note (Signed)
Procedure aborted due to poor prep

## 2021-04-10 NOTE — Anesthesia Preprocedure Evaluation (Signed)
Anesthesia Evaluation  Patient identified by MRN, date of birth, ID band Patient awake    Reviewed: Allergy & Precautions, H&P , NPO status , Patient's Chart, lab work & pertinent test results, reviewed documented beta blocker date and time   History of Anesthesia Complications Negative for: history of anesthetic complications  Airway Mallampati: II  TM Distance: >3 FB Neck ROM: full    Dental  (+) Dental Advidsory Given, Teeth Intact   Pulmonary neg shortness of breath, asthma , neg sleep apnea, neg recent URI,    Pulmonary exam normal breath sounds clear to auscultation       Cardiovascular Exercise Tolerance: Good hypertension, (-) angina(-) Past MI and (-) Cardiac Stents Normal cardiovascular exam(-) dysrhythmias (-) Valvular Problems/Murmurs Rhythm:regular Rate:Normal     Neuro/Psych negative neurological ROS  negative psych ROS   GI/Hepatic GERD  ,NAFLD   Endo/Other  diabetes  Renal/GU negative Renal ROS  negative genitourinary   Musculoskeletal   Abdominal   Peds  Hematology negative hematology ROS (+)   Anesthesia Other Findings Past Medical History: 03/30/2015: Abnormal presence of protein in urine 03/30/2015: Asthma, mild 03/30/2015: Benign hypertension 03/30/2015: Diabetes mellitus type 2 in obese (HCC) No date: Diabetes mellitus without complication (HCC) No date: Hyperlipidemia No date: Hypertension No date: Situs inversus   Reproductive/Obstetrics negative OB ROS                             Anesthesia Physical Anesthesia Plan  ASA: II  Anesthesia Plan: General   Post-op Pain Management:    Induction:   PONV Risk Score and Plan: 2 and TIVA and Propofol infusion  Airway Management Planned:   Additional Equipment:   Intra-op Plan:   Post-operative Plan:   Informed Consent: I have reviewed the patients History and Physical, chart, labs and discussed the procedure  including the risks, benefits and alternatives for the proposed anesthesia with the patient or authorized representative who has indicated his/her understanding and acceptance.     Dental Advisory Given  Plan Discussed with: Anesthesiologist, CRNA and Surgeon  Anesthesia Plan Comments:         Anesthesia Quick Evaluation

## 2021-04-10 NOTE — Anesthesia Postprocedure Evaluation (Signed)
Anesthesia Post Note  Patient: Andrew Sawyer  Procedure(s) Performed: COLONOSCOPY WITH PROPOFOL (N/A )  Patient location during evaluation: Endoscopy Anesthesia Type: General Level of consciousness: awake and alert Pain management: pain level controlled Vital Signs Assessment: post-procedure vital signs reviewed and stable Respiratory status: spontaneous breathing, nonlabored ventilation, respiratory function stable and patient connected to nasal cannula oxygen Cardiovascular status: blood pressure returned to baseline and stable Postop Assessment: no apparent nausea or vomiting Anesthetic complications: no   No complications documented.   Last Vitals:  Vitals:   04/10/21 1052 04/10/21 1102  BP: (!) 149/103 (!) 152/99  Pulse: 79 80  Resp: 15 18  Temp:    SpO2: 98% 98%    Last Pain:  Vitals:   04/10/21 1102  TempSrc:   PainSc: 0-No pain                 Lenard Simmer

## 2021-04-11 ENCOUNTER — Encounter: Payer: Self-pay | Admitting: Gastroenterology

## 2021-05-07 ENCOUNTER — Other Ambulatory Visit: Payer: Self-pay | Admitting: Family Medicine

## 2021-05-07 DIAGNOSIS — E1169 Type 2 diabetes mellitus with other specified complication: Secondary | ICD-10-CM

## 2021-06-05 ENCOUNTER — Other Ambulatory Visit: Payer: Self-pay

## 2021-06-05 ENCOUNTER — Encounter: Payer: Self-pay | Admitting: Family Medicine

## 2021-06-05 ENCOUNTER — Ambulatory Visit: Payer: BC Managed Care – PPO | Admitting: Family Medicine

## 2021-06-05 VITALS — BP 120/74 | HR 89 | Temp 98.0°F | Resp 18 | Ht 70.0 in | Wt 215.5 lb

## 2021-06-05 DIAGNOSIS — Z23 Encounter for immunization: Secondary | ICD-10-CM | POA: Diagnosis not present

## 2021-06-05 DIAGNOSIS — E559 Vitamin D deficiency, unspecified: Secondary | ICD-10-CM

## 2021-06-05 DIAGNOSIS — J479 Bronchiectasis, uncomplicated: Secondary | ICD-10-CM

## 2021-06-05 DIAGNOSIS — E669 Obesity, unspecified: Secondary | ICD-10-CM | POA: Diagnosis not present

## 2021-06-05 DIAGNOSIS — E1169 Type 2 diabetes mellitus with other specified complication: Secondary | ICD-10-CM

## 2021-06-05 DIAGNOSIS — E785 Hyperlipidemia, unspecified: Secondary | ICD-10-CM

## 2021-06-05 DIAGNOSIS — E1159 Type 2 diabetes mellitus with other circulatory complications: Secondary | ICD-10-CM

## 2021-06-05 DIAGNOSIS — J4520 Mild intermittent asthma, uncomplicated: Secondary | ICD-10-CM | POA: Diagnosis not present

## 2021-06-05 DIAGNOSIS — J069 Acute upper respiratory infection, unspecified: Secondary | ICD-10-CM

## 2021-06-05 DIAGNOSIS — I10 Essential (primary) hypertension: Secondary | ICD-10-CM

## 2021-06-05 DIAGNOSIS — I152 Hypertension secondary to endocrine disorders: Secondary | ICD-10-CM

## 2021-06-05 LAB — POCT GLYCOSYLATED HEMOGLOBIN (HGB A1C): Hemoglobin A1C: 6.6 % — AB (ref 4.0–5.6)

## 2021-06-05 MED ORDER — XIGDUO XR 10-1000 MG PO TB24: 1.0000 | ORAL_TABLET | Freq: Every day | ORAL | 1 refills | Status: DC

## 2021-06-05 MED ORDER — ROSUVASTATIN CALCIUM 40 MG PO TABS: 40.0000 mg | ORAL_TABLET | Freq: Every day | ORAL | 1 refills | Status: DC

## 2021-06-05 NOTE — Progress Notes (Signed)
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  His last urine micro was high at 270 he is on ARB and it went down to 62  He denies polyphagia, polyuria, polydipsia.    HTN: He was on Lotrel 5/40 , after that  Exforge , down to Diovan 320 and has been on 160 mg since 2021 and bp has been at goal . He denies chest pain, palpitation or dizziness. No side effects of medication, continue current regiment   URI: he states he has sinus infection. He noticed headache and sinus pressure two dao, he refuses being tested for COVID-19, he wants antibiotics. Explained he is considered high risk and would benefit from being tested and treated if needed. He states as far as I am concerned this is not anything worse than the flu. Try to educate him    HLD: We switched from Atorvastatin to Rosuvastatin back in Nov 2020 because of LDL was 107, he stopped taking fish oil, but last LDL was at goal at 27, continue current medication   Asthma/Bronchiectasy: He is under the care of Dr. Belia Heman, taking medication as prescribed, he states wheezing is no longer daily, he has a chronic cough usually productive usually yellow sputum, but no SOB at this time He is on Arnuity, but states very expensive, Dr. Belia Heman just refilled medications for another year. Unchanged   IMPRESSION: CT 2017 1. No evidence acute pulmonary embolism. 2. No acute pulmonary parenchymal abnormality. 3. Chronic bronchiectasis in the lingula. 4. Situs inversus. 5. Situs inversus with chronic bronchiectasis. Recommend correlation for Kartagener's syndrome.   Vitamin D : Last level back  at goal, taking otc supplementation .Continue supplements. Unchanged    Abnormal CT Chest: CT angio from 09/2016 noted: Situs inversus with chronic bronchiectasis (in the lingula). Impression corroborated with chest x-ray done on 01/2018. He saw Dr. Belia Heman and has diagnosed of asthma, taking medication and stable, still has daily cough and wheezing. Last visit with Dr. Belia Heman was 11/2019 , he states Dr. Belia Heman refills his medications    Elevated RBC: He is a truck drivers, he never had a sleep study, he is not interested , feels refreshed when,he wakes up, he denies testosterone supplementation He does not want to have a sleep study     Patient Active Problem List   Diagnosis Date Noted   Diabetes mellitus with microalbuminuria (HCC) 03/06/2021   Hypertension associated with type 2 diabetes mellitus (HCC) 03/06/2021   Chronic bronchiectasis not affecting current episode of care (HCC) 03/06/2021   Vitamin D deficiency 03/06/2021   Situs inversus 03/03/2018   Dyslipidemia associated with type 2 diabetes mellitus (HCC) 08/13/2016   Dermatitis of lower extremity 01/23/2016   Elevated liver enzymes 07/25/2015   Asthma, mild 03/30/2015   Diabetes mellitus type 2 in obese (HCC) 03/30/2015   Difficulty hearing 03/30/2015   Dyslipidemia 03/30/2015   Benign hypertension 03/30/2015   Obesity (BMI 30.0-34.9) 03/30/2015    Past Surgical History:  Procedure Laterality Date   BILIARY TUBE  PLACEMENT ([facility] HX)     COLONOSCOPY WITH PROPOFOL N/A 04/10/2021   Procedure: COLONOSCOPY WITH PROPOFOL;  Surgeon: Wyline Mood, MD;  Location: [facility] ENDOSCOPY;  Service: Gastroenterology;  Laterality: N/A;   NASAL SINUS SURGERY     RHINOPLASTY      Family History  Problem Relation Age of Onset   Diabetes Mother    Hypertension Father    Diabetes Brother    Depression Paternal Grandmother    Diabetes Paternal Grandmother    Hypertension Paternal Grandfather    Heart disease Paternal Grandfather    Cancer Paternal  Grandfather        Prostate    Social History   Tobacco Use   Smoking status: Never   Smokeless tobacco: Never  Substance Use Topics   Alcohol use: No     Current Outpatient Medications:    albuterol (VENTOLIN HFA) 108 (90 Base) MCG/ACT inhaler, Inhale 2 puffs into the lungs every 6 (six) hours as needed for wheezing or shortness of breath., Disp: 18 g, Rfl: 2   ARNUITY ELLIPTA 100 MCG/ACT AEPB, Inhale 1 puff by mouth once daily, Disp: 30 each, Rfl: 12   Cholecalciferol (VITAMIN D) 2000 units CAPS, Take 1 capsule (2,000 Units total) by mouth daily., Disp: 30 capsule, Rfl: 3   Dapagliflozin-metFORMIN HCl ER (XIGDUO XR) 09-999 MG TB24, Take 1 tablet by mouth daily., Disp: 90 tablet, Rfl: 1   rosuvastatin (CRESTOR) 40 MG tablet, Take 1 tablet (40 mg total) by mouth daily., Disp: 90 tablet, Rfl: 1   Semaglutide,0.25 or 0.5MG /DOS, (OZEMPIC, 0.25 OR 0.5 MG/DOSE,) 2 MG/1.5ML SOPN, Inject 0.5 mg into the skin once a week., Disp: 9 mL, Rfl: 1   triamcinolone ointment (KENALOG) 0.5 %, Apply 1 application topically 2 (two) times daily., Disp: 30 g, Rfl: 0   valsartan (DIOVAN) 160 MG tablet, Take 1 tablet (160 mg total) by mouth daily., Disp: 90 tablet, Rfl: 1   vitamin E 400 UNIT capsule, Take 1 capsule by mouth 2 (two) times daily., Disp: , Rfl:   No Known Allergies  I personally reviewed active problem list, medication list, allergies, family history, social history with the patient/caregiver today.   ROS  Constitutional: Negative for fever or weight change.  Respiratory: positive  for cough but no  shortness of breath.   Cardiovascular: Negative for chest pain or palpitations.  Gastrointestinal: Negative for abdominal pain, no bowel changes.  Musculoskeletal: Negative for gait problem or joint swelling.  Skin: Negative for rash.  Neurological: Negative for dizziness , positive for headache.  No other specific complaints in a complete review of systems (except as listed in HPI above).    Objective  Vitals:   06/05/21 1051  BP: 120/74  Pulse: 89  Resp: 18  Temp: 98 F (36.7 C)  TempSrc: Oral  SpO2: 95%  Weight: 215 lb 8 oz (97.8 kg)  Height: 5\' 10"  (1.778 m)    Body mass index is 30.92 kg/m.  Physical Exam  Constitutional: Patient appears well-developed and well-nourished. No distress.  HEENT: head atraumatic, normocephalic, pupils equal and reactive to light, ears not done, neck supple, throat not examed  Cardiovascular: Normal rate, regular rhythm and normal heart sounds.  No murmur heard. No BLE edema. Pulmonary/Chest: Effort normal and breath sounds normal. No respiratory distress. Abdominal: Soft.  There is no tenderness. Psychiatric: Patient has a normal mood and affect. behavior is normal. Judgment and thought content normal.   Recent Results (from the past 2160 hour(s))  Glucose, capillary  Status: Abnormal   Collection Time: 04/10/21 10:04 AM  Result Value Ref Range   Glucose-Capillary 111 (H) 70 - 99 mg/dL    Comment: Glucose reference range applies only to samples taken after fasting for at least 8 hours.   Comment 1 IN EPIC   POCT HgB A1C     Status: Abnormal   Collection Time: 06/05/21 11:08 AM  Result Value Ref Range   Hemoglobin A1C 6.6 (A) 4.0 - 5.6 %   HbA1c POC (<> result, manual entry)     HbA1c, POC (prediabetic range)     HbA1c, POC (controlled diabetic range)      Diabetic Foot Exam: Diabetic Foot Exam - Simple   Simple Foot Form Diabetic Foot exam was performed with the following findings: Yes 06/05/2021 11:39 AM  Visual Inspection No deformities, no ulcerations, no other skin breakdown bilaterally: Yes Sensation Testing Intact to touch and monofilament testing bilaterally: Yes Pulse Check Posterior Tibialis and Dorsalis pulse intact bilaterally: Yes Comments      PHQ2/9: Depression screen [facility] 2/9 06/05/2021 03/06/2021 12/05/2020 08/22/2020 05/09/2020  Decreased Interest 0 0 0 0 0  Down, Depressed, Hopeless 0 0 0 0  0  PHQ - 2 Score 0 0 0 0 0  Altered sleeping - - - 0 0  Tired, decreased energy - - - 0 0  Change in appetite - - - 0 0  Feeling bad or failure about yourself  - - - 0 0  Trouble concentrating - - - 0 0  Moving slowly or fidgety/restless - - - 0 0  Suicidal thoughts - - - 0 0  PHQ-9 Score - - - 0 0  Difficult doing work/chores - - - - -  Some recent data might be hidden    phq 9 is positive   Fall Risk: Fall Risk  06/05/2021 03/06/2021 12/05/2020 08/22/2020 05/09/2020  Falls in the past year? 0 0 0 0 0  Number falls in past yr: 0 0 0 0 0  Injury with Fall? 0 0 0 0 0  Follow up Falls evaluation completed - - - -     Functional Status Survey: Is the patient deaf or have difficulty hearing?: Yes Does the patient have difficulty seeing, even when wearing glasses/contacts?: No Does the patient have difficulty concentrating, remembering, or making decisions?: No Does the patient have difficulty walking or climbing stairs?: No Does the patient have difficulty dressing or bathing?: No Does the patient have difficulty doing errands alone such as visiting a doctor's office or shopping?: No   Assessment & Plan    1. Diabetes mellitus type 2 in obese (HCC)  - POCT HgB A1C - Dapagliflozin-metFORMIN HCl ER (XIGDUO XR) 09-999 MG TB24; Take 1 tablet by mouth daily.  Dispense: 90 tablet; Refill: 1  2. Benign hypertension   3. Dyslipidemia associated with type 2 diabetes mellitus (HCC)  - rosuvastatin (CRESTOR) 40 MG tablet; Take 1 tablet (40 mg total) by mouth daily.  Dispense: 90 tablet; Refill: 1  4. Vitamin D deficiency   5. Mild intermittent asthma, unspecified whether complicated   6. Chronic bronchiectasis not affecting current episode of care (HCC)   7. Hypertension associated with type 2 diabetes mellitus (HCC)   8. Dyslipidemia   9. Need for shingles vaccine  - Varicella-zoster vaccine IM  10. Need for vaccination for pneumococcus  - Pneumococcal conjugate  vaccine 20-valent (Prevnar 20)  11. Viral upper respiratory tract infection  He will contact us back if needed ,  advised saline spray, rest, otc mediations for now

## 2021-06-10 ENCOUNTER — Other Ambulatory Visit: Payer: Self-pay | Admitting: Family Medicine

## 2021-06-10 DIAGNOSIS — L309 Dermatitis, unspecified: Secondary | ICD-10-CM

## 2021-06-12 MED ORDER — TRIAMCINOLONE ACETONIDE 0.5 % EX OINT: 1.0000 "application " | TOPICAL_OINTMENT | Freq: Two times a day (BID) | CUTANEOUS | 0 refills | Status: DC

## 2021-09-15 NOTE — Progress Notes (Signed)
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    HTN: He was on Lotrel 5/40 , after that  Exforge , down to Diovan 320 and since 2021 on lower dose of Diovan 160 mg and bp has been at goal . He denies chest pain, palpitation or dizziness. No side effects of medication   HLD: We switched from Atorvastatin to Rosuvastatin back in Nov 2020 because of LDL was 107, he stopped taking fish oil, but last LDL was at goal at 66, continue current medication. Unchanged    Asthma/Bronchiectasy: He is under the care of Dr. Belia Heman, taking medication as prescribed, he states wheezing is no longer daily, he has a chronic cough usually productive usually yellow sputum, but no SOB at this time He is was on  Arnuity, but states very expensive, Dr. Belia Heman , he has been out of medication , waiting for follow up    IMPRESSION: CT 2017 1. No evidence acute pulmonary embolism. 2. No acute pulmonary parenchymal abnormality. 3. Chronic bronchiectasis in the lingula. 4. Situs inversus. 5. Situs inversus with chronic bronchiectasis. Recommend correlation for Kartagener's syndrome.   Vitamin D : Last level back at goal, taking otc supplementation .Continue supplements. Unchanged    Abnormal CT Chest: CT angio from 09/2016 noted: Situs inversus with chronic bronchiectasis (in the lingula). Impression corroborated with chest x-ray done on 01/2018. He saw Dr. Belia Heman and has diagnosed of asthma, taking medication and stable, still has daily cough and wheezing.  Last visit with Dr. 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, he is not interested , feels refreshed when, he wakes up, he denies testosterone supplementation He does not want to have a sleep study done at this time    Patient Active Problem List   Diagnosis Date Noted   Diabetes mellitus with microalbuminuria (HCC) 03/06/2021   Hypertension associated with type 2 diabetes mellitus (HCC) 03/06/2021   Chronic bronchiectasis not affecting current episode of care (HCC) 03/06/2021   Vitamin D deficiency 03/06/2021   Situs inversus 03/03/2018   Dyslipidemia associated with type 2 diabetes mellitus (HCC) 08/13/2016   Dermatitis of lower extremity 01/23/2016   Elevated liver enzymes 07/25/2015   Asthma, mild 03/30/2015   Diabetes mellitus type 2 in obese (HCC) 03/30/2015   Difficulty hearing 03/30/2015   Dyslipidemia 03/30/2015   Benign hypertension 03/30/2015   Obesity (BMI 30.0-34.9) 03/30/2015    Past Surgical History:  Procedure Laterality Date   BILIARY TUBE PLACEMENT ([facility] HX)     COLONOSCOPY WITH PROPOFOL N/A 04/10/2021   Procedure: COLONOSCOPY WITH PROPOFOL;  Surgeon: Wyline Mood, MD;  Location: [facility] ENDOSCOPY;  Service: Gastroenterology;  Laterality: N/A;   NASAL SINUS SURGERY     RHINOPLASTY      Family History  Problem  Relation Age of Onset   Diabetes Mother    Hypertension Father    Diabetes Brother    Depression Paternal Grandmother    Diabetes Paternal Grandmother    Hypertension Paternal Grandfather    Heart disease Paternal Grandfather    Cancer Paternal Grandfather        Prostate    Social History   Tobacco Use   Smoking status: Never   Smokeless tobacco: Never  Substance Use Topics   Alcohol use: No     Current Outpatient Medications:    albuterol (VENTOLIN HFA) 108 (90 Base) MCG/ACT inhaler, Inhale 2 puffs into the lungs every 6 (six) hours as needed for wheezing or  shortness of breath., Disp: 18 g, Rfl: 2   ARNUITY ELLIPTA 100 MCG/ACT AEPB, Inhale 1 puff by mouth once daily, Disp: 30 each, Rfl: 12   Cholecalciferol (VITAMIN D) 2000 units CAPS, Take 1 capsule (2,000 Units total) by mouth daily., Disp: 30 capsule, Rfl: 3   Dapagliflozin-metFORMIN HCl ER (XIGDUO XR) 09-999 MG TB24, Take 1 tablet by mouth daily., Disp: 90 tablet, Rfl: 1   rosuvastatin (CRESTOR) 40 MG tablet, Take 1 tablet (40 mg total) by mouth daily., Disp: 90 tablet, Rfl: 1   Semaglutide,0.25 or 0.5MG /DOS, (OZEMPIC, 0.25 OR 0.5 MG/DOSE,) 2 MG/1.5ML SOPN, Inject 0.5 mg into the skin once a week., Disp: 9 mL, Rfl: 1   triamcinolone ointment (KENALOG) 0.5 %, Apply 1 application topically 2 (two) times daily., Disp: 30 g, Rfl: 0   valsartan (DIOVAN) 160 MG tablet, Take 1 tablet (160 mg total) by mouth daily., Disp: 90 tablet, Rfl: 1   vitamin E 400 UNIT capsule, Take 1 capsule by mouth 2 (two) times daily., Disp: , Rfl:   No Known Allergies  I personally reviewed active problem list, medication list, allergies, family history, social history, health maintenance with the patient/caregiver today.   ROS  Constitutional: Negative for fever or weight change.  Respiratory: Negative for cough and shortness of breath.   Cardiovascular: Negative for chest pain or palpitations.  Gastrointestinal: Negative for abdominal pain, no bowel changes.  Musculoskeletal: Negative for gait problem or joint swelling.  Skin: Negative for rash.  Neurological: Negative for dizziness or headache.  No other specific complaints in a complete review of systems (except as listed in HPI above).   Objective  Vitals:   09/18/21 0741  BP: 122/86  Pulse: 85  Resp: 16  Temp: 98.1 F (36.7 C)  SpO2: 98%  Weight: 216 lb (98 kg)  Height: 5\' 10"  (1.778 m)    Body mass index is 30.99 kg/m.  Physical Exam  Constitutional: Patient appears well-developed and well-nourished. Obese  No distress.  HEENT: head  atraumatic, normocephalic, pupils equal and reactive to light, neck supple,  Cardiovascular: Normal rate, regular rhythm and normal heart sounds.  No murmur heard. No BLE edema. Pulmonary/Chest: Effort normal and breath sounds normal. No respiratory distress. Abdominal: Soft.  There is no tenderness. Psychiatric: Patient has a normal mood and affect. behavior is normal. Judgment and thought content normal.   PHQ2/9: Depression screen [facility] 2/9 09/18/2021 06/05/2021 03/06/2021 12/05/2020 08/22/2020  Decreased Interest 0 0 0 0 0  Down, Depressed, Hopeless 0 0 0 0 0  PHQ - 2 Score 0 0 0 0 0  Altered sleeping - - - - 0  Tired, decreased energy - - - - 0  Change in appetite - - - - 0  Feeling bad or failure about yourself  - - - -  0  Trouble concentrating - - - - 0  Moving slowly or fidgety/restless - - - - 0  Suicidal thoughts - - - - 0  PHQ-9 Score - - - - 0  Difficult doing work/chores - - - - -  Some recent data might be hidden    phq 9 is negative  Fall Risk: Fall Risk  09/18/2021 06/05/2021 03/06/2021 12/05/2020 08/22/2020  Falls in the past year? 0 0 0 0 0  Number falls in past yr: 0 0 0 0 0  Injury with Fall? 0 0 0 0 0  Risk for fall due to : No Fall Risks - - - -  Follow up Falls prevention discussed Falls evaluation completed - - -      Functional Status Survey: Is the patient deaf or have difficulty hearing?: Yes Does the patient have difficulty seeing, even when wearing glasses/contacts?: No Does the patient have difficulty concentrating, remembering, or making decisions?: No Does the patient have difficulty walking or climbing stairs?: No Does the patient have difficulty dressing or bathing?: No Does the patient have difficulty doing errands alone such as visiting a doctor's office or shopping?: No    Assessment & Plan  1. Diabetes mellitus with microalbuminuria (HCC)  - POCT HgB A1C - Semaglutide,0.25 or 0.5MG /DOS, (OZEMPIC, 0.25 OR 0.5 MG/DOSE,) 2 MG/1.5ML SOPN;  Inject 0.5 mg into the skin once a week.  Dispense: 9 mL; Refill: 1 - Dapagliflozin-metFORMIN HCl ER (XIGDUO XR) 09-999 MG TB24; Take 1 tablet by mouth daily.  Dispense: 90 tablet; Refill: 1  2. Need for immunization against influenza  - Flu Vaccine QUAD 69mo+IM (Fluarix, Fluzone & Alfiuria Quad PF)  3. Vitamin D deficiency   4. Benign hypertension  - valsartan (DIOVAN) 160 MG tablet; Take 1 tablet (160 mg total) by mouth daily.  Dispense: 90 tablet; Refill: 1  5. Mild intermittent asthma, unspecified whether complicated   6. Dyslipidemia associated with type 2 diabetes mellitus (HCC)  - rosuvastatin (CRESTOR) 40 MG tablet; Take 1 tablet (40 mg total) by mouth daily.  Dispense: 90 tablet; Refill: 1  7. Chronic bronchiectasis not affecting current episode of care (HCC)   8. Hypertension associated with type 2 diabetes mellitus (HCC)  - valsartan (DIOVAN) 160 MG tablet; Take 1 tablet (160 mg total) by mouth daily.  Dispense: 90 tablet; Refill: 1  9. Situs inversus   10. Dyslipidemia

## 2021-09-18 ENCOUNTER — Ambulatory Visit (INDEPENDENT_AMBULATORY_CARE_PROVIDER_SITE_OTHER): Payer: No Typology Code available for payment source | Admitting: Family Medicine

## 2021-09-18 ENCOUNTER — Other Ambulatory Visit: Payer: Self-pay

## 2021-09-18 ENCOUNTER — Encounter: Payer: Self-pay | Admitting: Family Medicine

## 2021-09-18 VITALS — BP 122/86 | HR 85 | Temp 98.1°F | Resp 16 | Ht 70.0 in | Wt 216.0 lb

## 2021-09-18 DIAGNOSIS — Z23 Encounter for immunization: Secondary | ICD-10-CM | POA: Diagnosis not present

## 2021-09-18 DIAGNOSIS — E1169 Type 2 diabetes mellitus with other specified complication: Secondary | ICD-10-CM

## 2021-09-18 DIAGNOSIS — R809 Proteinuria, unspecified: Secondary | ICD-10-CM | POA: Diagnosis not present

## 2021-09-18 DIAGNOSIS — I10 Essential (primary) hypertension: Secondary | ICD-10-CM | POA: Diagnosis not present

## 2021-09-18 DIAGNOSIS — J4520 Mild intermittent asthma, uncomplicated: Secondary | ICD-10-CM

## 2021-09-18 DIAGNOSIS — E559 Vitamin D deficiency, unspecified: Secondary | ICD-10-CM

## 2021-09-18 DIAGNOSIS — J479 Bronchiectasis, uncomplicated: Secondary | ICD-10-CM

## 2021-09-18 DIAGNOSIS — E785 Hyperlipidemia, unspecified: Secondary | ICD-10-CM

## 2021-09-18 DIAGNOSIS — Q893 Situs inversus: Secondary | ICD-10-CM

## 2021-09-18 DIAGNOSIS — I152 Hypertension secondary to endocrine disorders: Secondary | ICD-10-CM

## 2021-09-18 DIAGNOSIS — E1129 Type 2 diabetes mellitus with other diabetic kidney complication: Secondary | ICD-10-CM

## 2021-09-18 DIAGNOSIS — E1159 Type 2 diabetes mellitus with other circulatory complications: Secondary | ICD-10-CM

## 2021-09-18 LAB — POCT GLYCOSYLATED HEMOGLOBIN (HGB A1C): Hemoglobin A1C: 7.2 % — AB (ref 4.0–5.6)

## 2021-09-18 MED ORDER — XIGDUO XR 10-1000 MG PO TB24: 1.0000 | ORAL_TABLET | Freq: Every day | ORAL | 1 refills | Status: DC

## 2021-09-18 MED ORDER — VALSARTAN 160 MG PO TABS: 160.0000 mg | ORAL_TABLET | Freq: Every day | ORAL | 1 refills | Status: DC

## 2021-09-18 MED ORDER — ROSUVASTATIN CALCIUM 40 MG PO TABS: 40.0000 mg | ORAL_TABLET | Freq: Every day | ORAL | 1 refills | Status: DC

## 2021-09-18 MED ORDER — OZEMPIC (0.25 OR 0.5 MG/DOSE) 2 MG/1.5ML ~~LOC~~ SOPN: 0.5000 mg | PEN_INJECTOR | SUBCUTANEOUS | 1 refills | Status: DC

## 2021-10-16 ENCOUNTER — Ambulatory Visit (INDEPENDENT_AMBULATORY_CARE_PROVIDER_SITE_OTHER): Payer: No Typology Code available for payment source | Admitting: Internal Medicine

## 2021-10-16 ENCOUNTER — Encounter: Payer: Self-pay | Admitting: Internal Medicine

## 2021-10-16 ENCOUNTER — Other Ambulatory Visit: Payer: Self-pay

## 2021-10-16 VITALS — BP 140/90 | HR 82 | Temp 97.1°F | Ht 70.0 in | Wt 217.8 lb

## 2021-10-16 DIAGNOSIS — J4520 Mild intermittent asthma, uncomplicated: Secondary | ICD-10-CM

## 2021-10-16 MED ORDER — ALBUTEROL SULFATE HFA 108 (90 BASE) MCG/ACT IN AERS: 2.0000 | INHALATION_SPRAY | Freq: Four times a day (QID) | RESPIRATORY_TRACT | 12 refills | Status: AC | PRN

## 2021-10-16 MED ORDER — ARNUITY ELLIPTA 100 MCG/ACT IN AEPB: INHALATION_SPRAY | RESPIRATORY_TRACT | 12 refills | Status: DC

## 2021-10-16 NOTE — Progress Notes (Signed)
   Name: Andrew Sawyer MRN: [id number] DOB: [birth year]     REFERRING MD : Olen Cordial    STUDIES:  CXR independently reviewed -2.23.19 I have Independently reviewed images of Interpretation: RT lung opacity c/w pneumonia   CHIEF COMPLAINT:  Follow up ASTHMA  HISTORY OF PRESENT ILLNESS:  No exacerbation at this time No evidence of heart failure at this time No evidence or signs of infection at this time No respiratory distress No fevers, chills, nausea, vomiting, diarrhea No evidence of lower extremity edema No evidence hemoptysis   Taking Arnuity without any problems Albuterol use is infrequent Would like to stick with 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 Has been without Arnuity for the last 3 months that has increased shortness of breath and productive cough Will refill Arnuity   Review of Systems:  Gen:  Denies  fever, sweats, chills weight loss  HEENT: Denies blurred vision, double vision, ear pain, eye pain, hearing loss, nose bleeds, sore throat Cardiac:  No dizziness, chest pain or heaviness, chest tightness,edema, No JVD Resp:   No cough, -sputum production, -shortness of breath,-wheezing, -hemoptysis,  Other:  All other systems negative    FLU SHOT up to date Nov 2020   BP 140/90 (BP Location: Left Arm, Patient Position: Sitting, Cuff Size: Normal)   Pulse 82   Temp (!) 97.1 F (36.2 C) (Oral)   Ht 5\' 10"  (1.778 m)   Wt 217 lb 12.8 oz (98.8 kg)   SpO2 97%   BMI 31.25 kg/m   Physical Examination:   General Appearance: No distress  EYES PERRLA, EOM intact.   NECK Supple, No JVD Pulmonary: normal breath sounds, No wheezing.  CardiovascularNormal S1,S2.  No m/r/g.   ALL OTHER ROS ARE NEGATIVE      ASSESSMENT / PLAN:  45 year old white male with asthma  Has been taking Arnuity asn is working well Mild intermittent asthma  Infrequent albuterol use  Previous history of pneumonia  Avoid allergens  No  exacerbation at this time No evidence of heart failure at this time No evidence or signs of infection at this time No respiratory distress No fevers, chills, nausea, vomiting, diarrhea No evidence of lower extremity edema No evidence hemoptysis   MEDICATION ADJUSTMENTS/LABS AND TESTS ORDERED: Continue inhalers as prescribed Avoid triggers Avoid second hand smoek Refill Arnuity   Patient satisfied with Plan of action and management. All questions answered  Follow-up in 1 year  Total time spent 21 minutes  Jaylianna Tatlock 09-14-1993, M.D.  [facility]  Medical Director [facility] Medical Director [facility] Cardio-Pulmonary Department

## 2021-10-16 NOTE — Patient Instructions (Signed)
Continue inhalers as prescribed Avoid allergens Avoid secondhand smoke

## 2021-11-03 ENCOUNTER — Other Ambulatory Visit: Payer: Self-pay | Admitting: Family Medicine

## 2021-11-03 DIAGNOSIS — E1169 Type 2 diabetes mellitus with other specified complication: Secondary | ICD-10-CM

## 2021-11-03 DIAGNOSIS — E785 Hyperlipidemia, unspecified: Secondary | ICD-10-CM

## 2021-11-03 NOTE — Telephone Encounter (Signed)
Requested Prescriptions  Pending Prescriptions Disp Refills  . rosuvastatin (CRESTOR) 40 MG tablet [Pharmacy Med Name: Rosuvastatin Calcium 40 MG Oral Tablet] 90 tablet 0    Sig: Take 1 tablet by mouth once daily     Cardiovascular:  Antilipid - Statins Failed - 11/03/2021  5:30 AM      Failed - Triglycerides in normal range and within 360 days    Triglycerides  Date Value Ref Range Status  03/06/2021 212 (H) <150 mg/dL Final    Comment:    . If a non-fasting specimen was collected, consider repeat triglyceride testing on a fasting specimen if clinically indicated.  Perry Mount et al. J. of Clin. Lipidol. 2015;9:129-169. Marland Kitchen          Passed - Total Cholesterol in normal range and within 360 days    Cholesterol, Total  Date Value Ref Range Status  04/23/2016 186 100 - 199 mg/dL Final   Cholesterol  Date Value Ref Range Status  03/06/2021 140 <200 mg/dL Final         Passed - LDL in normal range and within 360 days    LDL Cholesterol (Calc)  Date Value Ref Range Status  03/06/2021 66 mg/dL (calc) Final    Comment:    Reference range: <100 . Desirable range <100 mg/dL for primary prevention;   <70 mg/dL for patients with CHD or diabetic patients  with > or = 2 CHD risk factors. Marland Kitchen LDL-C is now calculated using the Martin-Hopkins  calculation, which is a validated novel method providing  better accuracy than the Friedewald equation in the  estimation of LDL-C.  Horald Pollen et al. Lenox Ahr. 3825;053(97): 2061-2068  (http://education.Quest[employer].com/faq/FAQ164)          Passed - HDL in normal range and within 360 days    HDL  Date Value Ref Range Status  03/06/2021 46 > OR = 40 mg/dL Final  67/34/1938 44 >90 mg/dL Final         Passed - Patient is not pregnant      Passed - Valid encounter within last 12 months    Recent Outpatient Visits          1 month ago Diabetes mellitus with microalbuminuria [facility])   [facility] [place], Danna Hefty, MD   5  months ago Diabetes mellitus type 2 in obese [facility])   [facility] [place], Danna Hefty, MD   8 months ago Diabetes mellitus type 2 in obese [facility])   [facility] Alba Cory, MD   11 months ago Diabetes mellitus type 2 in obese [facility])   [facility] Alba Cory, MD   1 year ago Diabetes mellitus type 2 in obese [facility])   [facility] Alba Cory, MD      Future Appointments            In 2 months Carlynn Purl, Danna Hefty, MD [facility], [facility]

## 2022-01-03 ENCOUNTER — Ambulatory Visit: Payer: No Typology Code available for payment source | Admitting: Family Medicine

## 2022-01-29 ENCOUNTER — Other Ambulatory Visit: Payer: Self-pay

## 2022-01-29 DIAGNOSIS — E1159 Type 2 diabetes mellitus with other circulatory complications: Secondary | ICD-10-CM

## 2022-01-29 DIAGNOSIS — I152 Hypertension secondary to endocrine disorders: Secondary | ICD-10-CM

## 2022-01-29 DIAGNOSIS — I10 Essential (primary) hypertension: Secondary | ICD-10-CM

## 2022-01-29 MED ORDER — VALSARTAN 160 MG PO TABS: 160.0000 mg | ORAL_TABLET | Freq: Every day | ORAL | 0 refills | Status: DC

## 2022-03-09 NOTE — Progress Notes (Signed)
Name: Andrew Sawyer   MRN: [id number]    DOB: [birth year]   Date:03/12/2022 ? ?     Progress Note ? ?Subjective ? ?Chief Complaint ? ?Follow Up ? ?HPI ? 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 Weight is down 8 lbs since last visit.  He denies polyphagia, polyuria, polydipsia.  ?  ?HTN: He was on Lotrel 5/40 , after that  Exforge , down to Diovan 320 and since 2021 on lower dose of Diovan 160 mg and bp has been at goal . He denies chest pain, palpitation or dizziness. We will continue current dose and go back to valsartan 320 mg if needed  ?  ?HLD: We switched from Atorvastatin to Rosuvastatin back in Nov 2020 because of LDL was 107, he stopped taking fish oil, but last LDL was at goal at 40, we will recheck labs today  ?  ?Asthma/Bronchiectasy: He is under the care of Dr. Mortimer Fries, taking medication as prescribed, he states wheezing is no longer daily, he has a chronic cough usually productive usually yellow sputum but only occasionally and no longer having SOBHe is still on Arnuity but states very costly. Advised him to call his insurance or ask pharmacist if a cheaper alternative   ?  ?IMPRESSION: CT 2017 ?1. No evidence acute pulmonary embolism. ?2. No acute pulmonary parenchymal abnormality. ?3. Chronic bronchiectasis in the lingula. ?4. Situs inversus. ?5. Situs inversus with chronic bronchiectasis. Recommend correlation ?for Kartagener's syndrome. ?  ?Vitamin D : Last level back at goal, taking otc supplementation .Continue supplements. ?  ?Abnormal CT Chest: CT angio  from 09/2016 noted: Situs inversus with chronic bronchiectasis (in the lingula). Impression corroborated with chest x-ray done on 01/2018. He saw Dr. Mortimer Fries and has diagnosed of asthma.  ?  ?Elevated RBC: He is a truck drivers, he never had a sleep study, he is not interested , feels refreshed when, he wakes up, he denies testosterone supplementation We will recheck CBC today  ?  ?Patient Active Problem List  ? Diagnosis Date Noted  ? Diabetes mellitus with microalbuminuria ([place]) 03/06/2021  ? Hypertension associated with type 2 diabetes mellitus ([place]) 03/06/2021  ? Chronic bronchiectasis not affecting current episode of care [facility]) 03/06/2021  ? Vitamin D deficiency 03/06/2021  ? Situs inversus 03/03/2018  ? Dyslipidemia associated with type 2 diabetes mellitus ([place]) 08/13/2016  ? Dermatitis of lower extremity 01/23/2016  ? Elevated liver enzymes 07/25/2015  ? Asthma, mild 03/30/2015  ? Diabetes mellitus type 2 in obese ([place]) 03/30/2015  ? Difficulty hearing 03/30/2015  ? Dyslipidemia 03/30/2015  ? Benign hypertension 03/30/2015  ? Obesity (BMI 30.0-34.9) 03/30/2015  ? ? ?Past Surgical History:  ?Procedure Laterality Date  ? BILIARY TUBE PLACEMENT ([place] HX)    ? COLONOSCOPY WITH PROPOFOL N/A 04/10/2021  ? Procedure: COLONOSCOPY WITH PROPOFOL;  Surgeon: Jonathon Bellows, MD;  Location: [facility] ENDOSCOPY;  Service: Gastroenterology;  Laterality: N/A;  ? NASAL SINUS SURGERY    ?  RHINOPLASTY    ? ? ?Family History  ?Problem Relation Age of Onset  ? Diabetes Mother   ? Hypertension Father   ? Diabetes Brother   ? Depression Paternal Grandmother   ? Diabetes Paternal Grandmother   ? Hypertension Paternal Grandfather   ? Heart disease Paternal Grandfather   ? Cancer Paternal Grandfather   ?     Prostate  ? ? ?Social History  ? ?Tobacco Use  ? Smoking status: Never  ? Smokeless tobacco: Never  ?Substance Use Topics  ? Alcohol use: No  ? ? ? ?Current Outpatient Medications:  ?  albuterol (VENTOLIN HFA) 108 (90 Base) MCG/ACT inhaler,  Inhale 2 puffs into the lungs every 6 (six) hours as needed for wheezing or shortness of breath., Disp: 18 g, Rfl: 12 ?  ARNUITY ELLIPTA 100 MCG/ACT AEPB, Inhale 1 puff by mouth once daily, Disp: 30 each, Rfl: 12 ?  Cholecalciferol (VITAMIN D) 2000 units CAPS, Take 1 capsule (2,000 Units total) by mouth daily., Disp: 30 capsule, Rfl: 3 ?  Dapagliflozin-metFORMIN HCl ER (XIGDUO XR) 09-999 MG TB24, Take 1 tablet by mouth daily., Disp: 90 tablet, Rfl: 1 ?  rosuvastatin (CRESTOR) 40 MG tablet, Take 1 tablet by mouth once daily, Disp: 90 tablet, Rfl: 0 ?  Semaglutide,0.25 or 0.5MG /DOS, (OZEMPIC, 0.25 OR 0.5 MG/DOSE,) 2 MG/1.5ML SOPN, Inject 0.5 mg into the skin once a week., Disp: 9 mL, Rfl: 1 ?  triamcinolone ointment (KENALOG) 0.5 %, Apply 1 application topically 2 (two) times daily., Disp: 30 g, Rfl: 0 ?  valsartan (DIOVAN) 160 MG tablet, Take 1 tablet (160 mg total) by mouth daily., Disp: 90 tablet, Rfl: 0 ?  vitamin E 400 UNIT capsule, Take 1 capsule by mouth 2 (two) times daily., Disp: , Rfl:  ? ?No Known Allergies ? ?I personally reviewed active problem list, medication list, allergies, family history, social history, health maintenance with the patient/caregiver today. ? ? ?ROS ? ?Constitutional: Negative for fever , positive weight change.  ?Respiratory: positive  for cough but no  shortness of breath.   ?Cardiovascular: Negative for chest pain or palpitations.  ?Gastrointestinal: Negative for abdominal pain, no bowel changes.  ?Musculoskeletal: Negative for gait problem or joint swelling.  ?Skin: Negative for rash.  ?Neurological: Negative for dizziness or headache.  ?No other specific complaints in a complete review of systems (except as listed in HPI above).  ? ?Objective ? ?Vitals:  ? 03/12/22 1011  ?BP: 136/84  ?Pulse: 86  ?Resp: 16  ?SpO2: 97%  ?Weight: 210 lb (95.3 kg)  ?Height: 5\' 10"  (1.778 m)  ? ? ?Body mass index is 30.13 kg/m?. ? ?Physical Exam ? ?Constitutional: Patient appears well-developed and  well-nourished. Obese  No distress.  ?HEENT: head atraumatic, normocephalic, pupils equal and reactive to light, neck supple, throat within normal limits ?Cardiovascular: Normal rate, regular rhythm and normal heart sounds.  No murmur heard. No BLE edema. ?Pulmonary/Chest: Effort normal and breath sounds normal. No respiratory distress. ?Abdominal: Soft.  There is no tenderness. ?Psychiatric: Patient has a normal mood and affect. behavior is normal. Judgment and thought content normal.  ? ? ? ?PHQ2/9: ?Depression screen [facility] 2/9 03/12/2022 09/18/2021 06/05/2021 03/06/2021 12/05/2020  ?Decreased Interest 0 0 0 0 0  ?Down, Depressed, Hopeless 0 0 0 0 0  ?PHQ - 2 Score 0 0 0 0 0  ?Altered sleeping 0 - - - -  ?Tired, decreased energy 0 - - - -  ?Change in appetite 0 - - - -  ?  Feeling bad or failure about yourself  0 - - - -  ?Trouble concentrating 0 - - - -  ?Moving slowly or fidgety/restless 0 - - - -  ?Suicidal thoughts 0 - - - -  ?PHQ-9 Score 0 - - - -  ?Difficult doing work/chores - - - - -  ?Some recent data might be hidden  ?  ?phq 9 is negative ? ? ?Fall Risk: ?Fall Risk  03/12/2022 09/18/2021 06/05/2021 03/06/2021 12/05/2020  ?Falls in the past year? 0 0 0 0 0  ?Number falls in past yr: 0 0 0 0 0  ?Injury with Fall? 0 0 0 0 0  ?Risk for fall due to : No Fall Risks No Fall Risks - - -  ?Follow up Falls prevention discussed Falls prevention discussed Falls evaluation completed - -  ? ? ? ? ?Functional Status Survey: ?Is the patient deaf or have difficulty hearing?: Yes ?Does the patient have difficulty seeing, even when wearing glasses/contacts?: No ?Does the patient have difficulty concentrating, remembering, or making decisions?: No ?Does the patient have difficulty walking or climbing stairs?: No ?Does the patient have difficulty dressing or bathing?: No ?Does the patient have difficulty doing errands alone such as visiting a doctor's office or shopping?: No ? ? ? ?Assessment & Plan ? ?1. Diabetes mellitus with  microalbuminuria ([place]) ? ?- HgB A1c ?- Dapagliflozin-metFORMIN HCl ER (XIGDUO XR) 09-999 MG TB24; Take 1 tablet by mouth daily.  Dispense: 90 tablet; Refill: 1 ? ?2. Hypertension associated with type 2 diabetes

## 2022-03-12 ENCOUNTER — Encounter: Payer: Self-pay | Admitting: Family Medicine

## 2022-03-12 ENCOUNTER — Ambulatory Visit (INDEPENDENT_AMBULATORY_CARE_PROVIDER_SITE_OTHER): Payer: No Typology Code available for payment source | Admitting: Family Medicine

## 2022-03-12 VITALS — BP 136/84 | HR 86 | Resp 16 | Ht 70.0 in | Wt 210.0 lb

## 2022-03-12 DIAGNOSIS — Q893 Situs inversus: Secondary | ICD-10-CM

## 2022-03-12 DIAGNOSIS — E1129 Type 2 diabetes mellitus with other diabetic kidney complication: Secondary | ICD-10-CM | POA: Diagnosis not present

## 2022-03-12 DIAGNOSIS — E1169 Type 2 diabetes mellitus with other specified complication: Secondary | ICD-10-CM

## 2022-03-12 DIAGNOSIS — E559 Vitamin D deficiency, unspecified: Secondary | ICD-10-CM | POA: Diagnosis not present

## 2022-03-12 DIAGNOSIS — E785 Hyperlipidemia, unspecified: Secondary | ICD-10-CM

## 2022-03-12 DIAGNOSIS — Z79899 Other long term (current) drug therapy: Secondary | ICD-10-CM

## 2022-03-12 DIAGNOSIS — E1159 Type 2 diabetes mellitus with other circulatory complications: Secondary | ICD-10-CM

## 2022-03-12 DIAGNOSIS — R809 Proteinuria, unspecified: Secondary | ICD-10-CM

## 2022-03-12 DIAGNOSIS — J4520 Mild intermittent asthma, uncomplicated: Secondary | ICD-10-CM

## 2022-03-12 DIAGNOSIS — I152 Hypertension secondary to endocrine disorders: Secondary | ICD-10-CM

## 2022-03-12 DIAGNOSIS — I10 Essential (primary) hypertension: Secondary | ICD-10-CM

## 2022-03-12 MED ORDER — VALSARTAN 160 MG PO TABS: 160.0000 mg | ORAL_TABLET | Freq: Every day | ORAL | 0 refills | Status: DC

## 2022-03-12 MED ORDER — ROSUVASTATIN CALCIUM 40 MG PO TABS: 40.0000 mg | ORAL_TABLET | Freq: Every day | ORAL | 1 refills | Status: DC

## 2022-03-12 MED ORDER — XIGDUO XR 10-1000 MG PO TB24: 1.0000 | ORAL_TABLET | Freq: Every day | ORAL | 1 refills | Status: DC

## 2022-03-12 NOTE — Patient Instructions (Signed)
Ask pharmacist if Asmanex or Flovent would be cheaper for you  ?

## 2022-03-13 LAB — CBC WITH DIFFERENTIAL/PLATELET
Absolute Monocytes: 662 cells/uL (ref 200–950)
Basophils Absolute: 43 cells/uL (ref 0–200)
Basophils Relative: 0.6 %
Eosinophils Absolute: 202 cells/uL (ref 15–500)
Eosinophils Relative: 2.8 %
HCT: 50.5 % — ABNORMAL HIGH (ref 38.5–50.0)
Hemoglobin: 17 g/dL (ref 13.2–17.1)
Lymphs Abs: 1663 cells/uL (ref 850–3900)
MCH: 28.1 pg (ref 27.0–33.0)
MCHC: 33.7 g/dL (ref 32.0–36.0)
MCV: 83.3 fL (ref 80.0–100.0)
MPV: 9.2 fL (ref 7.5–12.5)
Monocytes Relative: 9.2 %
Neutro Abs: 4630 cells/uL (ref 1500–7800)
Neutrophils Relative %: 64.3 %
Platelets: 323 10*3/uL (ref 140–400)
RBC: 6.06 10*6/uL — ABNORMAL HIGH (ref 4.20–5.80)
RDW: 13.5 % (ref 11.0–15.0)
Total Lymphocyte: 23.1 %
WBC: 7.2 10*3/uL (ref 3.8–10.8)

## 2022-03-13 LAB — COMPLETE METABOLIC PANEL WITH GFR
AG Ratio: 1.7 (calc) (ref 1.0–2.5)
ALT: 11 U/L (ref 9–46)
AST: 13 U/L (ref 10–40)
Albumin: 4.5 g/dL (ref 3.6–5.1)
Alkaline phosphatase (APISO): 78 U/L (ref 36–130)
BUN: 14 mg/dL (ref 7–25)
CO2: 28 mmol/L (ref 20–32)
Calcium: 9.2 mg/dL (ref 8.6–10.3)
Chloride: 104 mmol/L (ref 98–110)
Creat: 0.97 mg/dL (ref 0.60–1.29)
Globulin: 2.7 g/dL (calc) (ref 1.9–3.7)
Glucose, Bld: 109 mg/dL — ABNORMAL HIGH (ref 65–99)
Potassium: 4.6 mmol/L (ref 3.5–5.3)
Sodium: 139 mmol/L (ref 135–146)
Total Bilirubin: 0.5 mg/dL (ref 0.2–1.2)
Total Protein: 7.2 g/dL (ref 6.1–8.1)
eGFR: 97 mL/min/{1.73_m2} (ref >=60)

## 2022-03-13 LAB — MICROALBUMIN / CREATININE URINE RATIO
Creatinine, Urine: 75 mg/dL (ref 20–320)
Microalb Creat Ratio: 47 mcg/mg creat — ABNORMAL HIGH (ref <30)
Microalb, Ur: 3.5 mg/dL

## 2022-03-13 LAB — HEMOGLOBIN A1C
Hgb A1c MFr Bld: 6.7 % of total Hgb — ABNORMAL HIGH (ref <5.7)
Mean Plasma Glucose: 146 mg/dL
eAG (mmol/L): 8.1 mmol/L

## 2022-03-13 LAB — LIPID PANEL
Cholesterol: 137 mg/dL (ref <200)
HDL: 37 mg/dL — ABNORMAL LOW (ref >=40)
LDL Cholesterol (Calc): 78 mg/dL (calc)
Non-HDL Cholesterol (Calc): 100 mg/dL (calc) (ref <130)
Total CHOL/HDL Ratio: 3.7 (calc) (ref <5.0)
Triglycerides: 120 mg/dL (ref <150)

## 2022-03-24 ENCOUNTER — Other Ambulatory Visit: Payer: Self-pay | Admitting: Family Medicine

## 2022-03-24 DIAGNOSIS — E1129 Type 2 diabetes mellitus with other diabetic kidney complication: Secondary | ICD-10-CM

## 2022-06-12 ENCOUNTER — Other Ambulatory Visit: Payer: Self-pay | Admitting: Family Medicine

## 2022-06-12 DIAGNOSIS — E1129 Type 2 diabetes mellitus with other diabetic kidney complication: Secondary | ICD-10-CM

## 2022-06-22 NOTE — Progress Notes (Unsigned)
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   His last urine micro went down from 270 to 62 and last visit it was down to 47   he is on ARB .He has associated dyslipidemia and HTN, he is on ARB and statin therapy. Weight is down 8 lbs since last visit.  He denies polyphagia, polyuria, polydipsia.    HTN: He was on Lotrel 5/40 , after that  Exforge , down to Diovan 320 and since 2021 on lower dose of Diovan 160 mg and bp has been at goal . He denies chest pain, palpitation or dizziness. Urine micro is trending down    HLD: We switched from Atorvastatin to Rosuvastatin back in Nov 2020 because LDL was 107, he stopped taking fish oil, last LDL was 78 but it was 66 prior to that. We discussed adding medication or rechecking next visit to see if he can improve his diet and increase physical active    Asthma/Bronchiectasy: He is under the care of Dr. Mortimer Fries, he sees him once a year, taking medication as prescribed, he states wheezing is no longer daily, he has a chronic cough usually productive usually yellow sputum but only occasionally and no longer having SOB. He is still on Arnuity but states very costly. Advised him last visit  to call his insurance or ask pharmacist for a cheaper alternative     IMPRESSION: CT 2017 1. No evidence acute pulmonary embolism. 2. No acute pulmonary parenchymal abnormality. 3. Chronic bronchiectasis in the lingula. 4. Situs inversus. 5. Situs inversus with chronic bronchiectasis. Recommend correlation for Kartagener's syndrome.   Vitamin D : Last level back at goal, taking otc supplementation Unchanged.   Abnormal CT Chest: CT angio from 09/2016 noted: Situs inversus with chronic  bronchiectasis (in the lingula). Impression corroborated with chest x-ray done on 01/2018. He saw Dr. Mortimer Fries and has diagnosed of asthma. 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, he is not interested , feels refreshed when, he wakes up, he denies testosterone supplementation Last hemoglobin was back to normal   Patient Active Problem List   Diagnosis Date Noted   Diabetes mellitus with microalbuminuria ([place]) 03/06/2021   Hypertension associated with type 2 diabetes mellitus ([place]) 03/06/2021   Chronic bronchiectasis not affecting current episode of care ([place]) 03/06/2021   Vitamin D deficiency 03/06/2021   Situs inversus 03/03/2018   Dyslipidemia associated with type 2 diabetes mellitus ([place]) 08/13/2016   Dermatitis of lower extremity 01/23/2016   Elevated liver enzymes 07/25/2015   Asthma, mild 03/30/2015   Diabetes mellitus type 2 in obese ([place]) 03/30/2015   Difficulty hearing 03/30/2015   Dyslipidemia 03/30/2015   Benign hypertension 03/30/2015   Obesity (BMI 30.0-34.9) 03/30/2015    Past Surgical History:  Procedure Laterality Date   BILIARY TUBE PLACEMENT ([place] HX)     COLONOSCOPY WITH PROPOFOL N/A 04/10/2021   Procedure: COLONOSCOPY WITH PROPOFOL;  Surgeon: Jonathon Bellows, MD;  Location: [facility] ENDOSCOPY;  Service: Gastroenterology;  Laterality: N/A;   NASAL SINUS SURGERY  RHINOPLASTY      Family History  Problem Relation Age of Onset   Diabetes Mother    Hypertension Father    Diabetes Brother    Depression Paternal Grandmother    Diabetes Paternal Grandmother    Hypertension Paternal Grandfather    Heart disease Paternal Grandfather    Cancer Paternal Grandfather        Prostate    Social History   Tobacco Use   Smoking status: Never   Smokeless tobacco: Never  Substance Use Topics   Alcohol use: No     Current Outpatient Medications:    albuterol (VENTOLIN HFA) 108 (90 Base) MCG/ACT inhaler, Inhale 2 puffs into the lungs  every 6 (six) hours as needed for wheezing or shortness of breath., Disp: 18 g, Rfl: 12   ARNUITY ELLIPTA 100 MCG/ACT AEPB, Inhale 1 puff by mouth once daily, Disp: 30 each, Rfl: 12   Cholecalciferol (VITAMIN D) 2000 units CAPS, Take 1 capsule (2,000 Units total) by mouth daily., Disp: 30 capsule, Rfl: 3   Dapagliflozin-metFORMIN HCl ER (XIGDUO XR) 09-999 MG TB24, Take 1 tablet by mouth daily., Disp: 90 tablet, Rfl: 1   OZEMPIC, 0.25 OR 0.5 MG/DOSE, 2 MG/1.5ML SOPN, INJECT 0.5MG  INTO THE SKIN ONCE A WEEK, Disp: 6 mL, Rfl: 0   rosuvastatin (CRESTOR) 40 MG tablet, Take 1 tablet (40 mg total) by mouth daily., Disp: 90 tablet, Rfl: 1   triamcinolone ointment (KENALOG) 0.5 %, Apply 1 application topically 2 (two) times daily., Disp: 30 g, Rfl: 0   vitamin E 400 UNIT capsule, Take 1 capsule by mouth 2 (two) times daily., Disp: , Rfl:    valsartan (DIOVAN) 160 MG tablet, Take 1 tablet (160 mg total) by mouth daily., Disp: 90 tablet, Rfl: 0  No Known Allergies  I personally reviewed active problem list, medication list, allergies, family history, social history, health maintenance with the patient/caregiver today.   ROS  Constitutional: Negative for fever or weight change.  Respiratory: Negative for cough and shortness of breath.   Cardiovascular: Negative for chest pain or palpitations.  Gastrointestinal: Negative for abdominal pain, no bowel changes.  Musculoskeletal: Negative for gait problem or joint swelling.  Skin: Negative for rash.  Neurological: Negative for dizziness or headache.  No other specific complaints in a complete review of systems (except as listed in HPI above).   Objective  Vitals:   06/25/22 0845  BP: 122/88  Pulse: 86  Resp: 16  SpO2: 98%  Weight: 209 lb (94.8 kg)  Height: 5\' 10"  (1.778 m)    Body mass index is 29.99 kg/m.  Physical Exam  Constitutional: Patient appears well-developed and well-nourished.  No distress.  HEENT: head atraumatic,  normocephalic, pupils equal and reactive to light, neck supple Cardiovascular: Normal rate, regular rhythm and normal heart sounds.  No murmur heard. No BLE edema. Pulmonary/Chest: Effort normal and breath sounds normal. No respiratory distress. Abdominal: Soft.  There is no tenderness. Psychiatric: Patient has a normal mood and affect. behavior is normal. Judgment and thought content normal.   Recent Results (from the past 2160 hour(s))  POCT HgB A1C     Status: Abnormal   Collection Time: 06/25/22  8:47 AM  Result Value Ref Range   Hemoglobin A1C 6.7 (A) 4.0 - 5.6 %   HbA1c POC (<> result, manual entry)     HbA1c, POC (prediabetic range)     HbA1c, POC (controlled diabetic range)      Diabetic Foot Exam: Diabetic Foot Exam -  Simple   Simple Foot Form Visual Inspection No deformities, no ulcerations, no other skin breakdown bilaterally: Yes Sensation Testing Intact to touch and monofilament testing bilaterally: Yes Pulse Check Posterior Tibialis and Dorsalis pulse intact bilaterally: Yes Comments      PHQ2/9:    06/25/2022    8:46 AM 03/12/2022   10:11 AM 09/18/2021    7:34 AM 06/05/2021   10:54 AM 03/06/2021    8:39 AM  Depression screen PHQ 2/9  Decreased Interest 0 0 0 0 0  Down, Depressed, Hopeless 0 0 0 0 0  PHQ - 2 Score 0 0 0 0 0  Altered sleeping 0 0     Tired, decreased energy 0 0     Change in appetite 0 0     Feeling bad or failure about yourself  0 0     Trouble concentrating 0 0     Moving slowly or fidgety/restless 0 0     Suicidal thoughts 0 0     PHQ-9 Score 0 0       phq 9 is negative   Fall Risk:    06/25/2022    8:46 AM 03/12/2022   10:11 AM 09/18/2021    7:34 AM 06/05/2021   10:53 AM 03/06/2021    8:39 AM  Fall Risk   Falls in the past year? 0 0 0 0 0  Number falls in past yr: 0 0 0 0 0  Injury with Fall? 0 0 0 0 0  Risk for fall due to : No Fall Risks No Fall Risks No Fall Risks    Follow up Falls prevention discussed Falls prevention  discussed Falls prevention discussed Falls evaluation completed       Functional Status Survey: Is the patient deaf or have difficulty hearing?: Yes Does the patient have difficulty seeing, even when wearing glasses/contacts?: No Does the patient have difficulty concentrating, remembering, or making decisions?: No Does the patient have difficulty walking or climbing stairs?: No Does the patient have difficulty dressing or bathing?: No Does the patient have difficulty doing errands alone such as visiting a doctor's office or shopping?: No    Assessment & Plan  1. Diabetes mellitus with microalbuminuria (HCC)  - POCT HgB A1C - HM Diabetes Foot Exam  2. Hypertension associated with type 2 diabetes mellitus (HCC)  - valsartan (DIOVAN) 160 MG tablet; Take 1 tablet (160 mg total) by mouth daily.  Dispense: 90 tablet; Refill: 0  3. Dyslipidemia associated with type 2 diabetes mellitus (HCC)  He will work on his diet and continue Crestor for now   4. Chronic bronchiectasis not affecting current episode of care [facility])  Keep follow up with Dr. Belia Heman  5. Benign hypertension  - valsartan (DIOVAN) 160 MG tablet; Take 1 tablet (160 mg total) by mouth daily.  Dispense: 90 tablet; Refill: 0  6. Vitamin D deficiency   7. Mild intermittent asthma without complication

## 2022-06-25 ENCOUNTER — Encounter: Payer: Self-pay | Admitting: Family Medicine

## 2022-06-25 ENCOUNTER — Ambulatory Visit (INDEPENDENT_AMBULATORY_CARE_PROVIDER_SITE_OTHER): Payer: No Typology Code available for payment source | Admitting: Family Medicine

## 2022-06-25 VITALS — BP 122/88 | HR 86 | Resp 16 | Ht 70.0 in | Wt 209.0 lb

## 2022-06-25 DIAGNOSIS — J479 Bronchiectasis, uncomplicated: Secondary | ICD-10-CM | POA: Diagnosis not present

## 2022-06-25 DIAGNOSIS — I10 Essential (primary) hypertension: Secondary | ICD-10-CM

## 2022-06-25 DIAGNOSIS — E1129 Type 2 diabetes mellitus with other diabetic kidney complication: Secondary | ICD-10-CM

## 2022-06-25 DIAGNOSIS — E1159 Type 2 diabetes mellitus with other circulatory complications: Secondary | ICD-10-CM | POA: Diagnosis not present

## 2022-06-25 DIAGNOSIS — E785 Hyperlipidemia, unspecified: Secondary | ICD-10-CM

## 2022-06-25 DIAGNOSIS — J4520 Mild intermittent asthma, uncomplicated: Secondary | ICD-10-CM

## 2022-06-25 DIAGNOSIS — R809 Proteinuria, unspecified: Secondary | ICD-10-CM | POA: Diagnosis not present

## 2022-06-25 DIAGNOSIS — I152 Hypertension secondary to endocrine disorders: Secondary | ICD-10-CM

## 2022-06-25 DIAGNOSIS — E559 Vitamin D deficiency, unspecified: Secondary | ICD-10-CM

## 2022-06-25 DIAGNOSIS — E1169 Type 2 diabetes mellitus with other specified complication: Secondary | ICD-10-CM

## 2022-06-25 LAB — POCT GLYCOSYLATED HEMOGLOBIN (HGB A1C): Hemoglobin A1C: 6.7 % — AB (ref 4.0–5.6)

## 2022-06-25 MED ORDER — VALSARTAN 160 MG PO TABS: 160.0000 mg | ORAL_TABLET | Freq: Every day | ORAL | 0 refills | Status: DC

## 2022-06-25 NOTE — Patient Instructions (Signed)
Ask pharmacy if Qvar or Asmanex would be cheaper

## 2022-07-02 LAB — BASIC METABOLIC PANEL: Glucose: 139

## 2022-07-02 LAB — LIPID PANEL
Cholesterol: 137 (ref 0–200)
HDL: 37 (ref 35–70)
LDL Cholesterol: 65
Triglycerides: 198 — AB (ref 40–160)

## 2022-07-02 LAB — HEMOGLOBIN A1C: Hemoglobin A1C: 7.1

## 2022-09-13 ENCOUNTER — Other Ambulatory Visit: Payer: Self-pay | Admitting: Family Medicine

## 2022-09-13 DIAGNOSIS — E1129 Type 2 diabetes mellitus with other diabetic kidney complication: Secondary | ICD-10-CM

## 2022-09-21 NOTE — Progress Notes (Unsigned)
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   His last urine micro went down from 270 to 62 and last visit it was down to 47   he is on ARB .He has associated dyslipidemia and HTN, he is on ARB and statin therapy. Weight is down 8 lbs since last visit.  He denies polyphagia, polyuria, polydipsia.    HTN: He was on Lotrel 5/40 , after that  Exforge , down to Diovan 320 and since 2021 on lower dose of Diovan 160 mg and bp has been at goal . He denies chest pain, palpitation or dizziness. Urine micro is trending down    HLD: We switched from Atorvastatin to Rosuvastatin back in Nov 2020 because LDL was 107, he stopped taking fish oil, last LDL was 78 but it was 66 prior to that. We discussed adding medication or rechecking next visit to see if he can improve his diet and increase physical active    Asthma/Bronchiectasy: He is under the care of Dr. Belia Heman, he sees him once a year, taking medication as prescribed, he states wheezing is no longer daily, he has a chronic cough usually productive usually yellow sputum but only occasionally and no longer having SOB. He is still on Arnuity but states very costly. Advised him last visit  to call his insurance or ask pharmacist for a cheaper alternative     IMPRESSION: CT 2017 1. No evidence acute pulmonary embolism. 2. No acute pulmonary parenchymal abnormality. 3. Chronic bronchiectasis in the lingula. 4. Situs inversus. 5. Situs inversus with chronic bronchiectasis. Recommend correlation for Kartagener's syndrome.   Vitamin D : Last level back at goal, taking otc supplementation Unchanged.   Abnormal CT Chest: CT angio from 09/2016 noted: Situs inversus with chronic  bronchiectasis (in the lingula). Impression corroborated with chest x-ray done on 01/2018. He saw Dr. Belia Heman and has diagnosed of asthma. 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, he is not interested , feels refreshed when, he wakes up, he denies testosterone supplementation Last hemoglobin was back to normal   Patient Active Problem List   Diagnosis Date Noted   Diabetes mellitus with microalbuminuria (HCC) 03/06/2021   Hypertension associated with type 2 diabetes mellitus (HCC) 03/06/2021   Chronic bronchiectasis not affecting current episode of care (HCC) 03/06/2021   Vitamin D deficiency 03/06/2021   Situs inversus 03/03/2018   Dyslipidemia associated with type 2 diabetes mellitus (HCC) 08/13/2016   Dermatitis of lower extremity 01/23/2016   Elevated liver enzymes 07/25/2015   Asthma, mild 03/30/2015   Diabetes mellitus type 2 in obese (HCC) 03/30/2015   Difficulty hearing 03/30/2015   Dyslipidemia 03/30/2015   Benign hypertension 03/30/2015   Obesity (BMI 30.0-34.9) 03/30/2015    Past Surgical History:  Procedure Laterality Date   BILIARY TUBE PLACEMENT ([facility] HX)     COLONOSCOPY WITH PROPOFOL N/A 04/10/2021   Procedure: COLONOSCOPY WITH PROPOFOL;  Surgeon: Wyline Mood, MD;  Location: [facility] ENDOSCOPY;  Service: Gastroenterology;  Laterality: N/A;   NASAL SINUS SURGERY  RHINOPLASTY      Family History  Problem Relation Age of Onset   Diabetes Mother    Hypertension Father    Diabetes Brother    Depression Paternal Grandmother    Diabetes Paternal Grandmother    Hypertension Paternal Grandfather    Heart disease Paternal Grandfather    Cancer Paternal Grandfather        Prostate    Social History   Tobacco Use   Smoking status: Never   Smokeless tobacco: Never  Substance Use Topics   Alcohol use: No     Current Outpatient Medications:    albuterol (VENTOLIN HFA) 108 (90 Base) MCG/ACT inhaler, Inhale 2 puffs into the lungs  every 6 (six) hours as needed for wheezing or shortness of breath., Disp: 18 g, Rfl: 12   ARNUITY ELLIPTA 100 MCG/ACT AEPB, Inhale 1 puff by mouth once daily, Disp: 30 each, Rfl: 12   Cholecalciferol (VITAMIN D) 2000 units CAPS, Take 1 capsule (2,000 Units total) by mouth daily., Disp: 30 capsule, Rfl: 3   Dapagliflozin-metFORMIN HCl ER (XIGDUO XR) 09-999 MG TB24, Take 1 tablet by mouth daily., Disp: 90 tablet, Rfl: 1   OZEMPIC, 0.25 OR 0.5 MG/DOSE, 2 MG/3ML SOPN, INJECT 0.5 MG SUBCUTANEOUSLY ONCE A WEEK, Disp: 9 mL, Rfl: 0   rosuvastatin (CRESTOR) 40 MG tablet, Take 1 tablet (40 mg total) by mouth daily., Disp: 90 tablet, Rfl: 1   triamcinolone ointment (KENALOG) 0.5 %, Apply 1 application topically 2 (two) times daily., Disp: 30 g, Rfl: 0   valsartan (DIOVAN) 160 MG tablet, Take 1 tablet (160 mg total) by mouth daily., Disp: 90 tablet, Rfl: 0   vitamin E 400 UNIT capsule, Take 1 capsule by mouth 2 (two) times daily., Disp: , Rfl:   No Known Allergies  I personally reviewed active problem list, medication list, allergies, family history, social history, health maintenance with the patient/caregiver today.   ROS  ***  Objective  There were no vitals filed for this visit.  There is no height or weight on file to calculate BMI.  Physical Exam ***  Recent Results (from the past 2160 hour(s))  POCT HgB A1C     Status: Abnormal   Collection Time: 06/25/22  8:47 AM  Result Value Ref Range   Hemoglobin A1C 6.7 (A) 4.0 - 5.6 %   HbA1c POC (<> result, manual entry)     HbA1c, POC (prediabetic range)     HbA1c, POC (controlled diabetic range)    Basic metabolic panel     Status: None   Collection Time: 07/02/22 12:00 AM  Result Value Ref Range   Glucose 139   Lipid panel     Status: Abnormal   Collection Time: 07/02/22 12:00 AM  Result Value Ref Range   Triglycerides 198 (A) 40 - 160   Cholesterol 137 0 - 200   HDL 37 35 - 70   LDL Cholesterol 65   Hemoglobin A1c     Status:  None   Collection Time: 07/02/22 12:00 AM  Result Value Ref Range   Hemoglobin A1C 7.1     Diabetic Foot Exam: Diabetic Foot Exam - Simple   No data filed    ***  PHQ2/9:    06/25/2022    8:46 AM 03/12/2022   10:11 AM 09/18/2021    7:34 AM 06/05/2021   10:54 AM 03/06/2021    8:39 AM  Depression screen PHQ 2/9  Decreased Interest 0 0 0 0 0  Down, Depressed,  Hopeless 0 0 0 0 0  PHQ - 2 Score 0 0 0 0 0  Altered sleeping 0 0     Tired, decreased energy 0 0     Change in appetite 0 0     Feeling bad or failure about yourself  0 0     Trouble concentrating 0 0     Moving slowly or fidgety/restless 0 0     Suicidal thoughts 0 0     PHQ-9 Score 0 0       phq 9 is {gen pos [id number]}   Fall Risk:    06/25/2022    8:46 AM 03/12/2022   10:11 AM 09/18/2021    7:34 AM 06/05/2021   10:53 AM 03/06/2021    8:39 AM  Fall Risk   Falls in the past year? 0 0 0 0 0  Number falls in past yr: 0 0 0 0 0  Injury with Fall? 0 0 0 0 0  Risk for fall due to : No Fall Risks No Fall Risks No Fall Risks    Follow up Falls prevention discussed Falls prevention discussed Falls prevention discussed Falls evaluation completed       Functional Status Survey:      Assessment & Plan  *** There are no diagnoses linked to this encounter.

## 2022-09-24 ENCOUNTER — Ambulatory Visit (INDEPENDENT_AMBULATORY_CARE_PROVIDER_SITE_OTHER): Payer: No Typology Code available for payment source | Admitting: Family Medicine

## 2022-09-24 ENCOUNTER — Encounter: Payer: Self-pay | Admitting: Family Medicine

## 2022-09-24 VITALS — BP 120/78 | HR 82 | Resp 16 | Ht 70.0 in | Wt 211.0 lb

## 2022-09-24 DIAGNOSIS — E1159 Type 2 diabetes mellitus with other circulatory complications: Secondary | ICD-10-CM | POA: Diagnosis not present

## 2022-09-24 DIAGNOSIS — J4520 Mild intermittent asthma, uncomplicated: Secondary | ICD-10-CM

## 2022-09-24 DIAGNOSIS — E1169 Type 2 diabetes mellitus with other specified complication: Secondary | ICD-10-CM

## 2022-09-24 DIAGNOSIS — Z23 Encounter for immunization: Secondary | ICD-10-CM

## 2022-09-24 DIAGNOSIS — R809 Proteinuria, unspecified: Secondary | ICD-10-CM | POA: Diagnosis not present

## 2022-09-24 DIAGNOSIS — E1129 Type 2 diabetes mellitus with other diabetic kidney complication: Secondary | ICD-10-CM

## 2022-09-24 DIAGNOSIS — J479 Bronchiectasis, uncomplicated: Secondary | ICD-10-CM

## 2022-09-24 DIAGNOSIS — E559 Vitamin D deficiency, unspecified: Secondary | ICD-10-CM

## 2022-09-24 DIAGNOSIS — I152 Hypertension secondary to endocrine disorders: Secondary | ICD-10-CM

## 2022-09-24 DIAGNOSIS — Q893 Situs inversus: Secondary | ICD-10-CM

## 2022-09-24 DIAGNOSIS — E785 Hyperlipidemia, unspecified: Secondary | ICD-10-CM

## 2022-09-24 DIAGNOSIS — I10 Essential (primary) hypertension: Secondary | ICD-10-CM

## 2022-09-24 LAB — POCT GLYCOSYLATED HEMOGLOBIN (HGB A1C): Hemoglobin A1C: 6.5 % — AB (ref 4.0–5.6)

## 2022-09-24 MED ORDER — VALSARTAN 160 MG PO TABS: 160.0000 mg | ORAL_TABLET | Freq: Every day | ORAL | 1 refills | Status: DC

## 2022-09-24 MED ORDER — OZEMPIC (0.25 OR 0.5 MG/DOSE) 2 MG/3ML ~~LOC~~ SOPN: PEN_INJECTOR | SUBCUTANEOUS | 1 refills | Status: DC

## 2022-09-24 MED ORDER — ROSUVASTATIN CALCIUM 40 MG PO TABS: 40.0000 mg | ORAL_TABLET | Freq: Every day | ORAL | 1 refills | Status: DC

## 2022-09-24 MED ORDER — XIGDUO XR 10-1000 MG PO TB24
1.0000 | ORAL_TABLET | Freq: Every day | ORAL | 1 refills | Status: DC
Start: 2022-09-24 — End: 2023-04-15

## 2022-10-31 ENCOUNTER — Other Ambulatory Visit: Payer: Self-pay | Admitting: Internal Medicine

## 2022-11-12 ENCOUNTER — Ambulatory Visit: Payer: No Typology Code available for payment source | Admitting: Internal Medicine

## 2022-12-03 ENCOUNTER — Encounter: Payer: Self-pay | Admitting: Internal Medicine

## 2022-12-03 ENCOUNTER — Ambulatory Visit (INDEPENDENT_AMBULATORY_CARE_PROVIDER_SITE_OTHER): Payer: No Typology Code available for payment source | Admitting: Internal Medicine

## 2022-12-03 VITALS — BP 138/90 | HR 67 | Temp 98.3°F | Ht 70.0 in | Wt 214.6 lb

## 2022-12-03 DIAGNOSIS — J4520 Mild intermittent asthma, uncomplicated: Secondary | ICD-10-CM | POA: Diagnosis not present

## 2022-12-03 NOTE — Patient Instructions (Addendum)
   Lets plan to wean off ARNUITY AND ASSESS BREATHING  Avoid triggers Avoid second hand smoek

## 2022-12-03 NOTE — Progress Notes (Signed)
   Name: Andrew Sawyer MRN: [id number] DOB: June 19, 1976     REFERRING MD : Olen Cordial    STUDIES:  CXR independently reviewed -2.23.19 I have Independently reviewed images of Interpretation: RT lung opacity c/w pneumonia   CHIEF COMPLAINT:  Follow up ASTHMA  HISTORY OF PRESENT ILLNESS:   No exacerbation at this time No evidence of heart failure at this time No evidence or signs of infection at this time No respiratory distress No fevers, chills, nausea, vomiting, diarrhea No evidence of lower extremity edema No evidence hemoptysis    Taking Arnuity without any problems Albuterol use is infrequent Lets plan to wean off ARNUITY AND ASSESS BREATHING  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      FLU SHOT up to date Nov 2020   BP (!) 138/90 (BP Location: Left Arm, Cuff Size: Normal)   Pulse 67   Temp 98.3 F (36.8 C) (Temporal)   Ht 5\' 10"  (1.778 m)   Wt 214 lb 9.6 oz (97.3 kg)   SpO2 100%   BMI 30.79 kg/m      Review of Systems: Gen:  Denies  fever, sweats, chills weight loss  HEENT: Denies blurred vision, double vision, ear pain, eye pain, hearing loss, nose bleeds, sore throat Cardiac:  No dizziness, chest pain or heaviness, chest tightness,edema, No JVD Resp:   No cough, -sputum production, -shortness of breath,-wheezing, -hemoptysis,  Other:  All other systems negative    Physical Examination:   General Appearance: No distress  EYES PERRLA, EOM intact.   NECK Supple, No JVD Pulmonary: normal breath sounds, No wheezing.  CardiovascularNormal S1,S2.  No m/r/g.   Abdomen: Benign, Soft, non-tender. ALL OTHER ROS ARE NEGATIVE       ASSESSMENT / PLAN:  46 year old white male with asthma  Has been taking Arnuity is working well-LETS PLAN TO WEAN OFF ARNUITY AND ASSESS RESP STATUS Mild intermittent asthma  Infrequent albuterol use  Previous history of pneumonia  Avoid allergens  No exacerbation at this time No evidence of  heart failure at this time No evidence or signs of infection at this time No respiratory distress No fevers, chills, nausea, vomiting, diarrhea No evidence of lower extremity edema No evidence hemoptysis    MEDICATION ADJUSTMENTS/LABS AND TESTS ORDERED:  Avoid triggers Avoid second hand smoek   Patient satisfied with Plan of action and management. All questions answered  Follow-up 1 year   Total time spent 22 minutes  Michiah Mudry 09-14-1993, M.D.  [facility]  Medical Director [facility] Medical Director [facility] Cardio-Pulmonary Department

## 2023-01-04 NOTE — Progress Notes (Unsigned)
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  He denies polyphagia, polyuria, polydipsia.    HTN: He was on Lotrel 5/40 , after that  Exforge , down to Diovan 320 and since 2021 on lower dose of Diovan 160 mg and bp has been at goal . He denies chest pain, palpitation or dizziness. Urine micro has improved and he denies any side effects of medication.   Bilateral hearing loss: getting hearing aid today  Dyslipidemia  We switched from Atorvastatin to Rosuvastatin back in Nov 2020 because LDL was 107, he stopped taking fish oil, last LDL was at goal at 65 06/2022 , we will recheck it next visit    Asthma/Bronchiectasy: He is under the care of Dr. Mortimer Fries, he sees him once a year,  he states cough has decreased significantly and wheezing is very seldom. He is now off Arnuity and only using prn albuterol    IMPRESSION: CT 2017 1. No evidence acute pulmonary embolism. 2. No acute pulmonary parenchymal abnormality. 3. Chronic bronchiectasis in the lingula. 4. Situs inversus. 5. Situs inversus with chronic bronchiectasis. Recommend correlation for Kartagener's syndrome.   Vitamin D : Last level back at goal, taking otc supplementation    Abnormal CT Chest: CT angio from 09/2016 noted: Situs inversus with chronic bronchiectasis (in the lingula). Impression corroborated with chest x-ray done on 01/2018. He saw Dr. Mortimer Fries and has diagnosed of asthma that is well controlled now    Patient Active Problem List   Diagnosis Date  Noted   Diabetes mellitus with microalbuminuria ([place]) 03/06/2021   Hypertension associated with type 2 diabetes mellitus ([place]) 03/06/2021   Chronic bronchiectasis not affecting current episode of care ([place]) 03/06/2021   Vitamin D deficiency 03/06/2021   Situs inversus 03/03/2018   Dyslipidemia associated with type 2 diabetes mellitus ([place]) 08/13/2016   Dermatitis of lower extremity 01/23/2016   Elevated liver enzymes 07/25/2015   Mild intermittent asthma without complication 84/16/6063   Diabetes mellitus type 2 in obese ([place]) 03/30/2015   Difficulty hearing 03/30/2015   Dyslipidemia 03/30/2015   Benign hypertension 03/30/2015   Obesity (BMI 30.0-34.9) 03/30/2015    Past Surgical History:  Procedure Laterality Date   BILIARY TUBE PLACEMENT ([place] HX)     COLONOSCOPY WITH PROPOFOL N/A 04/10/2021   Procedure: COLONOSCOPY WITH PROPOFOL;  Surgeon: Jonathon Bellows, MD;  Location: [facility] ENDOSCOPY;  Service: Gastroenterology;  Laterality: N/A;   NASAL SINUS SURGERY     RHINOPLASTY      Family History  Problem Relation Age of Onset   Diabetes Mother    Hypertension Father    Diabetes Brother    Depression Paternal Grandmother    Diabetes Paternal Grandmother    Hypertension Paternal Grandfather    Heart disease Paternal Grandfather    Cancer Paternal Grandfather  Prostate    Social History   Tobacco Use   Smoking status: Never   Smokeless tobacco: Never  Substance Use Topics   Alcohol use: No     Current Outpatient Medications:    albuterol (VENTOLIN HFA) 108 (90 Base) MCG/ACT inhaler, Inhale 2 puffs into the lungs every 6 (six) hours as needed for wheezing or shortness of breath., Disp: 18 g, Rfl: 12   Cholecalciferol (VITAMIN D) 2000 units CAPS, Take 1 capsule (2,000 Units total) by mouth daily., Disp: 30 capsule, Rfl: 3   Dapagliflozin-metFORMIN HCl ER (XIGDUO XR) 09-999 MG TB24, Take 1 tablet by mouth daily., Disp: 90 tablet, Rfl: 1   rosuvastatin (CRESTOR) 40 MG  tablet, Take 1 tablet (40 mg total) by mouth daily., Disp: 90 tablet, Rfl: 1   Semaglutide,0.25 or 0.5MG /DOS, (OZEMPIC, 0.25 OR 0.5 MG/DOSE,) 2 MG/3ML SOPN, INJECT 0.5 MG SUBCUTANEOUSLY ONCE A WEEK, Disp: 9 mL, Rfl: 1   triamcinolone ointment (KENALOG) 0.5 %, Apply 1 application topically 2 (two) times daily., Disp: 30 g, Rfl: 0   valsartan (DIOVAN) 160 MG tablet, Take 1 tablet (160 mg total) by mouth daily., Disp: 90 tablet, Rfl: 1   vitamin E 400 UNIT capsule, Take 1 capsule by mouth 2 (two) times daily., Disp: , Rfl:    ARNUITY ELLIPTA 100 MCG/ACT AEPB, Inhale 1 puff by mouth once daily (Patient not taking: Reported on 01/07/2023), Disp: 30 each, Rfl: 0  No Known Allergies  I personally reviewed active problem list, medication list, allergies, family history, social history, health maintenance with the patient/caregiver today.   ROS  Ten systems reviewed and is negative except as mentioned in HPI   Objective  Vitals:   01/07/23 0756  BP: 128/74  Pulse: 82  Resp: 16  SpO2: 96%  Weight: 214 lb (97.1 kg)  Height: 5\' 10"  (1.778 m)    Body mass index is 30.71 kg/m.  Physical Exam  Constitutional: Patient appears well-developed and well-nourished. Obese  No distress.  HEENT: head atraumatic, normocephalic, pupils equal and reactive to light, neck supple Cardiovascular: Normal rate, regular rhythm and normal heart sounds.  No murmur heard. No BLE edema. Pulmonary/Chest: Effort normal and breath sounds normal. No respiratory distress. Abdominal: Soft.  There is no tenderness. Psychiatric: Patient has a normal mood and affect. behavior is normal. Judgment and thought content normal.    PHQ2/9:    01/07/2023    7:57 AM 06/25/2022    8:46 AM 03/12/2022   10:11 AM 09/18/2021    7:34 AM 06/05/2021   10:54 AM  Depression screen PHQ 2/9  Decreased Interest 0 0 0 0 0  Down, Depressed, Hopeless 0 0 0 0 0  PHQ - 2 Score 0 0 0 0 0  Altered sleeping 0 0 0    Tired, decreased energy 0 0  0    Change in appetite 0 0 0    Feeling bad or failure about yourself  0 0 0    Trouble concentrating 0 0 0    Moving slowly or fidgety/restless 0 0 0    Suicidal thoughts 0 0 0    PHQ-9 Score 0 0 0      phq 9 is negative   Fall Risk:    01/07/2023    7:57 AM 09/24/2022    7:43 AM 06/25/2022    8:46 AM 03/12/2022   10:11 AM 09/18/2021    7:34 AM  Fall Risk   Falls in the past year? 0 0 0  0 0  Number falls in past yr: 0 0 0 0 0  Injury with Fall? 0 0 0 0 0  Risk for fall due to : No Fall Risks No Fall Risks No Fall Risks No Fall Risks No Fall Risks  Follow up Falls prevention discussed Falls prevention discussed Falls prevention discussed Falls prevention discussed Falls prevention discussed      Functional Status Survey: Is the patient deaf or have difficulty hearing?: Yes Does the patient have difficulty seeing, even when wearing glasses/contacts?: No Does the patient have difficulty concentrating, remembering, or making decisions?: No Does the patient have difficulty walking or climbing stairs?: No Does the patient have difficulty dressing or bathing?: No Does the patient have difficulty doing errands alone such as visiting a doctor's office or shopping?: No    Assessment & Plan  1. Diabetes mellitus with microalbuminuria (HCC)  - POCT HgB A1C - Semaglutide, 1 MG/DOSE, 4 MG/3ML SOPN; Inject 1 mg as directed once a week.  Dispense: 9 mL; Refill: 0  2. Dyslipidemia associated with type 2 diabetes mellitus (HCC)  - Semaglutide, 1 MG/DOSE, 4 MG/3ML SOPN; Inject 1 mg as directed once a week.  Dispense: 9 mL; Refill: 0  3. Chronic bronchiectasis not affecting current episode of care [facility])  Continue follow up with Dr. Belia Heman yearly   4. Vitamin D deficiency  Continue vitamin D supplementation  5. Need for shingles vaccine  - Varicella-zoster vaccine IM  6. Situs inversus   7. Benign hypertension  At goal   8. Mild intermittent asthma without complication

## 2023-01-07 ENCOUNTER — Encounter: Payer: Self-pay | Admitting: Family Medicine

## 2023-01-07 ENCOUNTER — Ambulatory Visit (INDEPENDENT_AMBULATORY_CARE_PROVIDER_SITE_OTHER): Payer: No Typology Code available for payment source | Admitting: Family Medicine

## 2023-01-07 VITALS — BP 128/74 | HR 82 | Resp 16 | Ht 70.0 in | Wt 214.0 lb

## 2023-01-07 DIAGNOSIS — H918X3 Other specified hearing loss, bilateral: Secondary | ICD-10-CM

## 2023-01-07 DIAGNOSIS — E559 Vitamin D deficiency, unspecified: Secondary | ICD-10-CM

## 2023-01-07 DIAGNOSIS — R809 Proteinuria, unspecified: Secondary | ICD-10-CM | POA: Diagnosis not present

## 2023-01-07 DIAGNOSIS — E1169 Type 2 diabetes mellitus with other specified complication: Secondary | ICD-10-CM

## 2023-01-07 DIAGNOSIS — I10 Essential (primary) hypertension: Secondary | ICD-10-CM

## 2023-01-07 DIAGNOSIS — Z23 Encounter for immunization: Secondary | ICD-10-CM | POA: Diagnosis not present

## 2023-01-07 DIAGNOSIS — J4520 Mild intermittent asthma, uncomplicated: Secondary | ICD-10-CM

## 2023-01-07 DIAGNOSIS — Q893 Situs inversus: Secondary | ICD-10-CM

## 2023-01-07 DIAGNOSIS — E1129 Type 2 diabetes mellitus with other diabetic kidney complication: Secondary | ICD-10-CM | POA: Diagnosis not present

## 2023-01-07 DIAGNOSIS — E785 Hyperlipidemia, unspecified: Secondary | ICD-10-CM

## 2023-01-07 DIAGNOSIS — J479 Bronchiectasis, uncomplicated: Secondary | ICD-10-CM

## 2023-01-07 LAB — POCT GLYCOSYLATED HEMOGLOBIN (HGB A1C): Hemoglobin A1C: 7.5 % — AB (ref 4.0–5.6)

## 2023-01-07 MED ORDER — SEMAGLUTIDE (1 MG/DOSE) 4 MG/3ML ~~LOC~~ SOPN: 1.0000 mg | PEN_INJECTOR | SUBCUTANEOUS | 0 refills | Status: DC

## 2023-04-12 NOTE — Progress Notes (Unsigned)
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  His last urine micro went down from 270 to 62 and last visit it was down to 47, he is on SGL-2 agonist  and ARB , we will recheck level today .He has associated dyslipidemia and HTN, he is on ARB and statin therapy.  He denies polyphagia, polyuria, polydipsia.    HTN: He was on Lotrel 5/40 , after that  Exforge , down to Diovan 320 and since 2021 on lower dose of Diovan 160 mg and bp has been at goal, today bp is slightly higher than usual . He denies chest pain, palpitation or dizziness.   Bilateral hearing loss: he has a hearing aid now but needs to get it adjusted   Dyslipidemia  We switched from Atorvastatin to Rosuvastatin back in Nov 2020 because LDL was 107, he stopped taking fish oil, last LDL was at goal at 65 06/2022 , we will recheck it today    Asthma/Bronchiectasy: He is under the care of Dr. Belia Heman, he sees him once a year,  he has an occasional cough and wheezing, no SOB . He is now off Arnuity and only using prn albuterol Unchanged    IMPRESSION: CT 2017 1. No evidence acute pulmonary embolism. 2. No acute pulmonary parenchymal abnormality. 3. Chronic bronchiectasis in the lingula. 4. Situs inversus. 5. Situs inversus with chronic bronchiectasis. Recommend correlation for Kartagener's syndrome.   Vitamin D : Last level back at goal, taking otc supplementation , we will recheck level today    Abnormal CT Chest: CT angio from 09/2016 noted: Situs inversus with chronic bronchiectasis (in the lingula). Impression corroborated with chest x-ray done on 01/2018. He saw Dr. Belia Heman and  has diagnosed of asthma that is well controlled now Unchanged    Patient Active Problem List   Diagnosis Date Noted   Diabetes mellitus with microalbuminuria 03/06/2021   Hypertension associated with type 2 diabetes mellitus 03/06/2021   Chronic bronchiectasis not affecting current episode of care 03/06/2021   Vitamin D deficiency 03/06/2021   Situs inversus 03/03/2018   Dyslipidemia associated with type 2 diabetes mellitus 08/13/2016   Dermatitis of lower extremity 01/23/2016   Elevated liver enzymes 07/25/2015   Mild intermittent asthma without complication 03/30/2015   Diabetes mellitus type 2 in obese 03/30/2015   Difficulty hearing 03/30/2015   Dyslipidemia 03/30/2015   Benign hypertension 03/30/2015   Obesity (BMI 30.0-34.9) 03/30/2015    Past Surgical History:  Procedure Laterality Date   BILIARY TUBE PLACEMENT ([facility] HX)     COLONOSCOPY WITH PROPOFOL N/A 04/10/2021   Procedure: COLONOSCOPY WITH PROPOFOL;  Surgeon: Wyline Mood, MD;  Location: [facility] ENDOSCOPY;  Service: Gastroenterology;  Laterality: N/A;   NASAL SINUS SURGERY     RHINOPLASTY      Family History  Problem Relation Age of Onset   Diabetes Mother    Hypertension Father    Diabetes Brother    Depression Paternal Grandmother    Diabetes Paternal Grandmother    Hypertension Paternal Actor  Heart disease Paternal Grandfather    Cancer Paternal Grandfather        Prostate    Social History   Tobacco Use   Smoking status: Never   Smokeless tobacco: Never  Substance Use Topics   Alcohol use: No     Current Outpatient Medications:    albuterol (VENTOLIN HFA) 108 (90 Base) MCG/ACT inhaler, Inhale 2 puffs into the lungs every 6 (six) hours as needed for wheezing or shortness of breath., Disp: 18 g, Rfl: 12   Cholecalciferol (VITAMIN D) 2000 units CAPS, Take 1 capsule (2,000 Units total) by mouth daily., Disp: 30 capsule, Rfl: 3   triamcinolone ointment (KENALOG) 0.5 %, Apply 1 application  topically 2 (two) times daily., Disp: 30 g, Rfl: 0   vitamin E 400 UNIT capsule, Take 1 capsule by mouth 2 (two) times daily., Disp: , Rfl:    Dapagliflozin Pro-metFORMIN ER (XIGDUO XR) 09-999 MG TB24, Take 1 tablet by mouth daily., Disp: 90 tablet, Rfl: 1   rosuvastatin (CRESTOR) 40 MG tablet, Take 1 tablet (40 mg total) by mouth daily., Disp: 90 tablet, Rfl: 1   Semaglutide, 1 MG/DOSE, 4 MG/3ML SOPN, Inject 1 mg as directed once a week., Disp: 9 mL, Rfl: 1   valsartan (DIOVAN) 160 MG tablet, Take 1 tablet (160 mg total) by mouth daily., Disp: 90 tablet, Rfl: 1  No Known Allergies  I personally reviewed active problem list, medication list, allergies, family history, social history, health maintenance with the patient/caregiver today.   ROS  Constitutional: Negative for fever or weight change.  Respiratory: Negative for cough and shortness of breath.   Cardiovascular: Negative for chest pain or palpitations.  Gastrointestinal: Negative for abdominal pain, no bowel changes.  Musculoskeletal: Negative for gait problem or joint swelling.  Skin: Negative for rash.  Neurological: Negative for dizziness or headache.  No other specific complaints in a complete review of systems (except as listed in HPI above).   Objective   Vitals:   04/15/23 0936 04/15/23 1019  BP: 138/88 (!) 140/100  Pulse: 83   Resp: 18   Temp: 97.8 F (36.6 C)   TempSrc: Oral   SpO2: 97%   Weight: 209 lb 4.8 oz (94.9 kg)   Height:  (1.778 m)     Body mass index is 30.03 kg/m.  Physical Exam  Constitutional: Patient appears well-developed and well-nourished. Obese  No distress.  HEENT: head atraumatic, normocephalic, pupils equal and reactive to light, neck supple Cardiovascular: Normal rate, regular rhythm and normal heart sounds.  No murmur heard. No BLE edema. Pulmonary/Chest: Effort normal and breath sounds normal. No respiratory distress. Abdominal: Soft.  There is no tenderness. Psychiatric:  Patient has a normal mood and affect. behavior is normal. Judgment and thought content normal.   Recent Results (from the past 2160 hour(s))  POCT HgB A1C     Status: Abnormal   Collection Time: 04/15/23  9:40 AM  Result Value Ref Range   Hemoglobin A1C 6.6 (A) 4.0 - 5.6 %   HbA1c POC (<> result, manual entry)     HbA1c, POC (prediabetic range)     HbA1c, POC (controlled diabetic range)       PHQ2/9:    04/15/2023    9:37 AM 01/07/2023    7:57 AM 06/25/2022    8:46 AM 03/12/2022   10:11 AM 09/18/2021    7:34 AM  Depression screen PHQ 2/9  Decreased Interest 0 0 0 0 0  Down, Depressed, Hopeless 0  0 0 0 0  PHQ - 2 Score 0 0 0 0 0  Altered sleeping 0 0 0 0   Tired, decreased energy 0 0 0 0   Change in appetite 0 0 0 0   Feeling bad or failure about yourself  0 0 0 0   Trouble concentrating 0 0 0 0   Moving slowly or fidgety/restless 0 0 0 0   Suicidal thoughts 0 0 0 0   PHQ-9 Score 0 0 0 0     phq 9 is negative   Fall Risk:    04/15/2023    9:37 AM 01/07/2023    7:57 AM 09/24/2022    7:43 AM 06/25/2022    8:46 AM 03/12/2022   10:11 AM  Fall Risk   Falls in the past year? 0 0 0 0 0  Number falls in past yr:  0 0 0 0  Injury with Fall?  0 0 0 0  Risk for fall due to : No Fall Risks No Fall Risks No Fall Risks No Fall Risks No Fall Risks  Follow up Falls prevention discussed Falls prevention discussed Falls prevention discussed Falls prevention discussed Falls prevention discussed      Functional Status Survey: Is the patient deaf or have difficulty hearing?: Yes Does the patient have difficulty seeing, even when wearing glasses/contacts?: No Does the patient have difficulty concentrating, remembering, or making decisions?: No Does the patient have difficulty walking or climbing stairs?: No Does the patient have difficulty dressing or bathing?: No Does the patient have difficulty doing errands alone such as visiting a doctor's office or shopping?: No    Assessment &  Plan  1. Diabetes mellitus with microalbuminuria  - POCT HgB A1C - Urine Microalbumin w/creat. ratio - Semaglutide, 1 MG/DOSE, 4 MG/3ML SOPN; Inject 1 mg as directed once a week.  Dispense: 9 mL; Refill: 1 - Dapagliflozin Pro-metFORMIN ER (XIGDUO XR) 09-999 MG TB24; Take 1 tablet by mouth daily.  Dispense: 90 tablet; Refill: 1  2. Need for Tdap vaccination  - Tdap vaccine greater than or equal to 7yo IM  3. Dyslipidemia associated with type 2 diabetes mellitus  - Semaglutide, 1 MG/DOSE, 4 MG/3ML SOPN; Inject 1 mg as directed once a week.  Dispense: 9 mL; Refill: 1 - rosuvastatin (CRESTOR) 40 MG tablet; Take 1 tablet (40 mg total) by mouth daily.  Dispense: 90 tablet; Refill: 1 - Lipid panel  4. Benign hypertension  - valsartan (DIOVAN) 160 MG tablet; Take 1 tablet (160 mg total) by mouth daily.  Dispense: 90 tablet; Refill: 1  Return in 1-2 weeks for bp check only with CMA  5. Hypertension associated with type 2 diabetes mellitus  - valsartan (DIOVAN) 160 MG tablet; Take 1 tablet (160 mg total) by mouth daily.  Dispense: 90 tablet; Refill: 1 - COMPLETE METABOLIC PANEL WITH GFR - CBC with Differential/Platelet  6. Vitamin D deficiency  - VITAMIN D 25 Hydroxy (Vit-D Deficiency, Fractures)  7. Chronic bronchiectasis not affecting current episode of care   8. Situs inversus

## 2023-04-15 ENCOUNTER — Encounter: Payer: Self-pay | Admitting: Family Medicine

## 2023-04-15 ENCOUNTER — Ambulatory Visit (INDEPENDENT_AMBULATORY_CARE_PROVIDER_SITE_OTHER): Payer: No Typology Code available for payment source | Admitting: Family Medicine

## 2023-04-15 VITALS — BP 140/100 | HR 83 | Temp 97.8°F | Resp 18 | Ht 70.0 in | Wt 209.3 lb

## 2023-04-15 DIAGNOSIS — R809 Proteinuria, unspecified: Secondary | ICD-10-CM | POA: Diagnosis not present

## 2023-04-15 DIAGNOSIS — J479 Bronchiectasis, uncomplicated: Secondary | ICD-10-CM

## 2023-04-15 DIAGNOSIS — I152 Hypertension secondary to endocrine disorders: Secondary | ICD-10-CM

## 2023-04-15 DIAGNOSIS — Z23 Encounter for immunization: Secondary | ICD-10-CM

## 2023-04-15 DIAGNOSIS — E559 Vitamin D deficiency, unspecified: Secondary | ICD-10-CM

## 2023-04-15 DIAGNOSIS — Q893 Situs inversus: Secondary | ICD-10-CM

## 2023-04-15 DIAGNOSIS — E785 Hyperlipidemia, unspecified: Secondary | ICD-10-CM

## 2023-04-15 DIAGNOSIS — I10 Essential (primary) hypertension: Secondary | ICD-10-CM

## 2023-04-15 DIAGNOSIS — E1129 Type 2 diabetes mellitus with other diabetic kidney complication: Secondary | ICD-10-CM

## 2023-04-15 DIAGNOSIS — E1169 Type 2 diabetes mellitus with other specified complication: Secondary | ICD-10-CM | POA: Diagnosis not present

## 2023-04-15 DIAGNOSIS — E1159 Type 2 diabetes mellitus with other circulatory complications: Secondary | ICD-10-CM

## 2023-04-15 LAB — CBC WITH DIFFERENTIAL/PLATELET
Hemoglobin: 16.7 g/dL (ref 13.2–17.1)
MCH: 27.2 pg (ref 27.0–33.0)
Monocytes Relative: 8.3 %
RBC: 6.15 10*6/uL — ABNORMAL HIGH (ref 4.20–5.80)

## 2023-04-15 LAB — POCT GLYCOSYLATED HEMOGLOBIN (HGB A1C): Hemoglobin A1C: 6.6 % — AB (ref 4.0–5.6)

## 2023-04-15 MED ORDER — VALSARTAN 160 MG PO TABS: 160.0000 mg | ORAL_TABLET | Freq: Every day | ORAL | 1 refills | Status: DC

## 2023-04-15 MED ORDER — ROSUVASTATIN CALCIUM 40 MG PO TABS: 40.0000 mg | ORAL_TABLET | Freq: Every day | ORAL | 1 refills | Status: DC

## 2023-04-15 MED ORDER — SEMAGLUTIDE (1 MG/DOSE) 4 MG/3ML ~~LOC~~ SOPN: 1.0000 mg | PEN_INJECTOR | SUBCUTANEOUS | 1 refills | Status: DC

## 2023-04-15 MED ORDER — DAPAGLIFLOZIN PRO-METFORMIN ER 10-1000 MG PO TB24: 1.0000 | ORAL_TABLET | Freq: Every day | ORAL | 1 refills | Status: DC

## 2023-04-16 LAB — VITAMIN D 25 HYDROXY (VIT D DEFICIENCY, FRACTURES): Vit D, 25-Hydroxy: 54 ng/mL (ref 30–100)

## 2023-04-16 LAB — LIPID PANEL
Cholesterol: 146 mg/dL (ref <200)
HDL: 46 mg/dL (ref >=40)
LDL Cholesterol (Calc): 73 mg/dL (calc)
Non-HDL Cholesterol (Calc): 100 mg/dL (calc) (ref <130)
Total CHOL/HDL Ratio: 3.2 (calc) (ref <5.0)
Triglycerides: 167 mg/dL — ABNORMAL HIGH (ref <150)

## 2023-04-16 LAB — CBC WITH DIFFERENTIAL/PLATELET
Absolute Monocytes: 681 cells/uL (ref 200–950)
Basophils Absolute: 33 cells/uL (ref 0–200)
Basophils Relative: 0.4 %
Eosinophils Absolute: 271 cells/uL (ref 15–500)
Eosinophils Relative: 3.3 %
HCT: 50.2 % — ABNORMAL HIGH (ref 38.5–50.0)
Lymphs Abs: 1779 cells/uL (ref 850–3900)
MCHC: 33.3 g/dL (ref 32.0–36.0)
MCV: 81.6 fL (ref 80.0–100.0)
MPV: 9.6 fL (ref 7.5–12.5)
Neutro Abs: 5437 cells/uL (ref 1500–7800)
Neutrophils Relative %: 66.3 %
Platelets: 318 10*3/uL (ref 140–400)
RDW: 13.5 % (ref 11.0–15.0)
Total Lymphocyte: 21.7 %
WBC: 8.2 10*3/uL (ref 3.8–10.8)

## 2023-04-16 LAB — COMPLETE METABOLIC PANEL WITH GFR
AG Ratio: 1.7 (calc) (ref 1.0–2.5)
ALT: 13 U/L (ref 9–46)
AST: 11 U/L (ref 10–40)
Albumin: 4.8 g/dL (ref 3.6–5.1)
Alkaline phosphatase (APISO): 73 U/L (ref 36–130)
BUN: 15 mg/dL (ref 7–25)
CO2: 28 mmol/L (ref 20–32)
Calcium: 9.2 mg/dL (ref 8.6–10.3)
Chloride: 103 mmol/L (ref 98–110)
Creat: 0.85 mg/dL (ref 0.60–1.29)
Globulin: 2.9 g/dL (calc) (ref 1.9–3.7)
Glucose, Bld: 115 mg/dL — ABNORMAL HIGH (ref 65–99)
Potassium: 4.4 mmol/L (ref 3.5–5.3)
Sodium: 140 mmol/L (ref 135–146)
Total Bilirubin: 0.4 mg/dL (ref 0.2–1.2)
Total Protein: 7.7 g/dL (ref 6.1–8.1)
eGFR: 107 mL/min/{1.73_m2} (ref >=60)

## 2023-04-16 LAB — MICROALBUMIN / CREATININE URINE RATIO
Creatinine, Urine: 76 mg/dL (ref 20–320)
Microalb Creat Ratio: 101 mg/g creat — ABNORMAL HIGH (ref <30)
Microalb, Ur: 7.7 mg/dL

## 2023-05-24 ENCOUNTER — Telehealth: Payer: Self-pay | Admitting: Family Medicine

## 2023-05-24 NOTE — Telephone Encounter (Signed)
PA Initated on cover my meds 05/24/23. Waiting for determination

## 2023-05-24 NOTE — Telephone Encounter (Signed)
Copied from CRM [id number]. Topic: General - Other >> May 24, 2023  2:39 PM Everette C wrote: Reason for CRM: The patient has called to request prior authorization for their Dapagliflozin Pro-metFORMIN ER (XIGDUO XR) 09-999 MG TB24 [[id number]]   Please contact further if needed

## 2023-07-12 NOTE — Progress Notes (Unsigned)
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    His last urine micro went down from 270 to 62 and last visit it was down to 47 but it went up again to 101 , he is on SGL-2 agonist  and ARB .He has associated dyslipidemia and takes statins  He denies polyphagia, polyuria, polydipsia.    HTN: He was on Lotrel 5/40 , after that  Exforge , down to Diovan 320 and since 2021 on lower dose of Diovan 160 mg and bp has been at goal . He denies chest pain, palpitation or dizziness. Urine micro went up again and we will continue to monitor   Bilateral hearing loss: he started using hearing aids and he states he is doing well   Dyslipidemia  he is on Crestor 40 mg last LDL was okay, we will recheck it yearly    Asthma/Bronchiectasy: He is under the care of Dr. Belia Heman, he sees him once a year,  he states cough has decreased significantly and wheezing is very seldom. He is now off Arnuity and only using prn albuterol , but not lately    IMPRESSION: CT 2017 1. No evidence acute pulmonary embolism. 2. No acute pulmonary parenchymal abnormality. 3. Chronic bronchiectasis in the lingula. 4. Situs inversus. 5. Situs inversus with chronic bronchiectasis. Recommend correlation for Kartagener's syndrome.   Vitamin D : Last level back at goal, taking otc supplementation    Abnormal CT Chest: CT angio from 09/2016 noted: Situs inversus with chronic bronchiectasis (in the lingula). Impression corroborated with chest x-ray done on 01/2018. He saw Dr. Belia Heman and has diagnosed of asthma but doing well now    Patient Active Problem List   Diagnosis Date Noted    Diabetes mellitus with microalbuminuria (HCC) 03/06/2021   Hypertension associated with type 2 diabetes mellitus (HCC) 03/06/2021   Chronic bronchiectasis not affecting current episode of care (HCC) 03/06/2021   Vitamin D deficiency 03/06/2021   Situs inversus 03/03/2018   Dyslipidemia associated with type 2 diabetes mellitus (HCC) 08/13/2016   Dermatitis of lower extremity 01/23/2016   Elevated liver enzymes 07/25/2015   Mild intermittent asthma without complication 03/30/2015   Diabetes mellitus type 2 in obese 03/30/2015   Difficulty hearing 03/30/2015   Dyslipidemia 03/30/2015   Benign hypertension 03/30/2015   Obesity (BMI 30.0-34.9) 03/30/2015    Past Surgical History:  Procedure Laterality Date   BILIARY TUBE PLACEMENT ([facility] HX)     COLONOSCOPY WITH PROPOFOL N/A 04/10/2021   Procedure: COLONOSCOPY WITH PROPOFOL;  Surgeon: Wyline Mood, MD;  Location: [facility] ENDOSCOPY;  Service: Gastroenterology;  Laterality: N/A;   NASAL SINUS SURGERY     RHINOPLASTY      Family History  Problem Relation Age of Onset   Diabetes Mother    Hypertension Father    Diabetes Brother    Depression Paternal Grandmother    Diabetes Paternal Grandmother    Hypertension Paternal Grandfather    Heart disease Paternal Grandfather  Cancer Paternal Grandfather        Prostate    Social History   Tobacco Use   Smoking status: Never   Smokeless tobacco: Never  Substance Use Topics   Alcohol use: No     Current Outpatient Medications:    albuterol (VENTOLIN HFA) 108 (90 Base) MCG/ACT inhaler, Inhale 2 puffs into the lungs every 6 (six) hours as needed for wheezing or shortness of breath., Disp: 18 g, Rfl: 12   Cholecalciferol (VITAMIN D) 2000 units CAPS, Take 1 capsule (2,000 Units total) by mouth daily., Disp: 30 capsule, Rfl: 3   Dapagliflozin Pro-metFORMIN ER (XIGDUO XR) 09-999 MG TB24, Take 1 tablet by mouth daily., Disp: 90 tablet, Rfl: 1   rosuvastatin (CRESTOR) 40 MG tablet, Take 1  tablet (40 mg total) by mouth daily., Disp: 90 tablet, Rfl: 1   Semaglutide, 1 MG/DOSE, 4 MG/3ML SOPN, Inject 1 mg as directed once a week., Disp: 9 mL, Rfl: 1   triamcinolone ointment (KENALOG) 0.5 %, Apply 1 application topically 2 (two) times daily., Disp: 30 g, Rfl: 0   valsartan (DIOVAN) 160 MG tablet, Take 1 tablet (160 mg total) by mouth daily., Disp: 90 tablet, Rfl: 1   vitamin E 400 UNIT capsule, Take 1 capsule by mouth 2 (two) times daily., Disp: , Rfl:   No Known Allergies  I personally reviewed active problem list, medication list, allergies, family history, social history, health maintenance with the patient/caregiver today.   ROS  Ten systems reviewed and is negative except as mentioned in HPI    Objective  Vitals:   07/15/23 0909  BP: 134/84  Pulse: 84  Resp: 18  Temp: 98.2 F (36.8 C)  TempSrc: Oral  SpO2: 97%  Weight: 199 lb 3.2 oz (90.4 kg)  Height: 5\' 10"  (1.778 m)    Body mass index is 28.58 kg/m.  Physical Exam  Constitutional: Patient appears well-developed and well-nourished. No distress.  HEENT: head atraumatic, normocephalic, pupils equal and reactive to light, hearing aids , neck supple, throat within normal limits Cardiovascular: Normal rate, regular rhythm and normal heart sounds.  No murmur heard. No BLE edema. Pulmonary/Chest: Effort normal and breath sounds normal. No respiratory distress. Abdominal: Soft.  There is no tenderness. Psychiatric: Patient has a normal mood and affect. behavior is normal. Judgment and thought content normal.    Diabetic Foot Exam: Diabetic Foot Exam - Simple   Simple Foot Form Visual Inspection See comments: Yes Sensation Testing Intact to touch and monofilament testing bilaterally: Yes Pulse Check Posterior Tibialis and Dorsalis pulse intact bilaterally: Yes Comments Dry spots on right arch, also thick , yellow changes on right big toe      PHQ2/9:    07/15/2023    9:12 AM 04/15/2023    9:37 AM  01/07/2023    7:57 AM 06/25/2022    8:46 AM 03/12/2022   10:11 AM  Depression screen PHQ 2/9  Decreased Interest 0 0 0 0 0  Down, Depressed, Hopeless 0 0 0 0 0  PHQ - 2 Score 0 0 0 0 0  Altered sleeping 0 0 0 0 0  Tired, decreased energy 3 0 0 0 0  Change in appetite 0 0 0 0 0  Feeling bad or failure about yourself  0 0 0 0 0  Trouble concentrating 0 0 0 0 0  Moving slowly or fidgety/restless 0 0 0 0 0  Suicidal thoughts 0 0 0 0 0  PHQ-9 Score 3 0 0 0  0  Difficult doing work/chores Not difficult at all        phq 9 is negative   Fall Risk:    07/15/2023    9:12 AM 04/15/2023    9:37 AM 01/07/2023    7:57 AM 09/24/2022    7:43 AM 06/25/2022    8:46 AM  Fall Risk   Falls in the past year? 0 0 0 0 0  Number falls in past yr:   0 0 0  Injury with Fall?   0 0 0  Risk for fall due to : No Fall Risks No Fall Risks No Fall Risks No Fall Risks No Fall Risks  Follow up Falls prevention discussed Falls prevention discussed Falls prevention discussed Falls prevention discussed Falls prevention discussed      Assessment & Plan  1. Dyslipidemia associated with type 2 diabetes mellitus (HCC)  - POCT HgB A1C - HM Diabetes Foot Exam  Lost weight due to having all teeth extracted about 1 months ago  2. Hypertension associated with type 2 diabetes mellitus (HCC)  BP is at goal  3. Chronic bronchiectasis not affecting current episode of care [facility])  Not on medication but on yearly follow ups with Dr. Belia Heman  4. Onychomycosis  - terbinafine (LAMISIL) 250 MG tablet; Take 1 tablet (250 mg total) by mouth daily.  Dispense: 90 tablet; Refill: 0  5. Situs inversus   6. Mild intermittent asthma without complication  Keep follow up with Dr. Belia Heman  7. Benign hypertension  Bp is at goal

## 2023-07-15 ENCOUNTER — Ambulatory Visit: Payer: No Typology Code available for payment source | Admitting: Family Medicine

## 2023-07-15 ENCOUNTER — Encounter: Payer: Self-pay | Admitting: Family Medicine

## 2023-07-15 VITALS — BP 134/84 | HR 84 | Temp 98.2°F | Resp 18 | Ht 70.0 in | Wt 199.2 lb

## 2023-07-15 DIAGNOSIS — I152 Hypertension secondary to endocrine disorders: Secondary | ICD-10-CM

## 2023-07-15 DIAGNOSIS — B351 Tinea unguium: Secondary | ICD-10-CM

## 2023-07-15 DIAGNOSIS — E1169 Type 2 diabetes mellitus with other specified complication: Secondary | ICD-10-CM

## 2023-07-15 DIAGNOSIS — E785 Hyperlipidemia, unspecified: Secondary | ICD-10-CM | POA: Diagnosis not present

## 2023-07-15 DIAGNOSIS — J479 Bronchiectasis, uncomplicated: Secondary | ICD-10-CM

## 2023-07-15 DIAGNOSIS — E1159 Type 2 diabetes mellitus with other circulatory complications: Secondary | ICD-10-CM | POA: Diagnosis not present

## 2023-07-15 DIAGNOSIS — I10 Essential (primary) hypertension: Secondary | ICD-10-CM

## 2023-07-15 DIAGNOSIS — Q893 Situs inversus: Secondary | ICD-10-CM

## 2023-07-15 DIAGNOSIS — J4520 Mild intermittent asthma, uncomplicated: Secondary | ICD-10-CM

## 2023-07-15 LAB — POCT GLYCOSYLATED HEMOGLOBIN (HGB A1C): Hemoglobin A1C: 6.2 % — AB (ref 4.0–5.6)

## 2023-07-15 MED ORDER — TERBINAFINE HCL 250 MG PO TABS
250.0000 mg | ORAL_TABLET | Freq: Every day | ORAL | 0 refills | Status: DC
Start: 2023-07-15 — End: 2023-10-14

## 2023-09-30 ENCOUNTER — Other Ambulatory Visit: Payer: Self-pay | Admitting: Family Medicine

## 2023-09-30 DIAGNOSIS — E1169 Type 2 diabetes mellitus with other specified complication: Secondary | ICD-10-CM

## 2023-09-30 DIAGNOSIS — E1129 Type 2 diabetes mellitus with other diabetic kidney complication: Secondary | ICD-10-CM

## 2023-10-11 NOTE — Progress Notes (Unsigned)
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  His last urine micro went down from 270 to 62 and lup to 47 and last time it was up again at 101 , he is on SGL-2 agonist  and ARB. He has associated dyslipidemia and takes statins  He denies polyphagia, polyuria, polydipsia.    HTN: He was on Lotrel 5/40 , after that  Exforge , down to Diovan 320 and since 2021 on lower dose of Diovan 160 mg .BP has been in the 130's lately so we will adjust dose of Valsartan to 320 mg again and monitor  . He denies chest pain, palpitation or dizziness. Urine micro went up again  Bilateral hearing loss: he started using hearing aids and he states he is doing well   Dyslipidemia  he is on Crestor 40 mg last LDL was 73 and improved    Asthma/Bronchiectasy: He is under the care of Dr. Belia Heman, he sees him once a year,  he states cough has decreased significantly and wheezing is very seldom. He is now off Arnuity and only using prn albuterol , he states only uses when humidity is very high   IMPRESSION: CT 2017 1. No evidence acute pulmonary embolism. 2. No acute pulmonary parenchymal abnormality. 3. Chronic bronchiectasis in the lingula. 4. Situs inversus. 5. Situs inversus with chronic bronchiectasis. Recommend correlation for Kartagener's syndrome.   Vitamin D : Last level back at goal, taking otc supplementation    Abnormal CT Chest: CT angio from 09/2016 noted: Situs inversus with chronic bronchiectasis (in the lingula). Impression corroborated with chest x-ray done on 01/2018. He saw Dr. Belia Heman and was diagnosed with asthma, stable at this time    Patient Active Problem List   Diagnosis Date Noted   Diabetes mellitus with microalbuminuria  (HCC) 03/06/2021   Hypertension associated with type 2 diabetes mellitus (HCC) 03/06/2021   Chronic bronchiectasis not affecting current episode of care (HCC) 03/06/2021   Vitamin D deficiency 03/06/2021   Situs inversus 03/03/2018   Dyslipidemia associated with type 2 diabetes mellitus (HCC) 08/13/2016   Dermatitis of lower extremity 01/23/2016   Elevated liver enzymes 07/25/2015   Mild intermittent asthma without complication 03/30/2015   Type 2 diabetes mellitus with obesity (HCC) 03/30/2015   Difficulty hearing 03/30/2015   Dyslipidemia 03/30/2015   Benign hypertension 03/30/2015   Obesity (BMI 30.0-34.9) 03/30/2015    Past Surgical History:  Procedure Laterality Date   BILIARY TUBE PLACEMENT ([facility] HX)     COLONOSCOPY WITH PROPOFOL N/A 04/10/2021   Procedure: COLONOSCOPY WITH PROPOFOL;  Surgeon: Wyline Mood, MD;  Location: [facility] ENDOSCOPY;  Service: Gastroenterology;  Laterality: N/A;   NASAL SINUS SURGERY     RHINOPLASTY      Family History  Problem Relation Age of Onset   Diabetes Mother    Hypertension Father    Diabetes Brother    Depression Paternal Grandmother    Diabetes Paternal Grandmother    Hypertension Paternal Grandfather    Heart disease Paternal Grandfather    Cancer Paternal Grandfather        Prostate  Social History   Tobacco Use   Smoking status: Never   Smokeless tobacco: Never  Substance Use Topics   Alcohol use: No     Current Outpatient Medications:    albuterol (VENTOLIN HFA) 108 (90 Base) MCG/ACT inhaler, Inhale 2 puffs into the lungs every 6 (six) hours as needed for wheezing or shortness of breath., Disp: 18 g, Rfl: 12   Cholecalciferol (VITAMIN D) 2000 units CAPS, Take 1 capsule (2,000 Units total) by mouth daily., Disp: 30 capsule, Rfl: 3   triamcinolone ointment (KENALOG) 0.5 %, Apply 1 application topically 2 (two) times daily., Disp: 30 g, Rfl: 0   vitamin E 400 UNIT capsule, Take 1 capsule by mouth 2 (two) times daily., Disp: ,  Rfl:    Dapagliflozin Pro-metFORMIN ER (XIGDUO XR) 09-999 MG TB24, Take 1 tablet by mouth daily., Disp: 90 tablet, Rfl: 1   rosuvastatin (CRESTOR) 40 MG tablet, Take 1 tablet (40 mg total) by mouth daily., Disp: 90 tablet, Rfl: 1   Semaglutide, 1 MG/DOSE, (OZEMPIC, 1 MG/DOSE,) 4 MG/3ML SOPN, Inject 1 mg into the skin once a week., Disp: 9 mL, Rfl: 1   valsartan (DIOVAN) 320 MG tablet, Take 1 tablet (320 mg total) by mouth daily., Disp: 90 tablet, Rfl: 0  No Known Allergies  I personally reviewed active problem list, medication list, allergies, family history, social history, health maintenance with the patient/caregiver today.   ROS  Ten systems reviewed and is negative except as mentioned in HPI    Objective  Vitals:   10/14/23 0917  BP: 132/82  Pulse: 90  Resp: 16  SpO2: 95%  Weight: 200 lb (90.7 kg)  Height: 5\' 10"  (1.778 m)    Body mass index is 28.7 kg/m.  Physical Exam  Constitutional: Patient appears well-developed and well-nourished.  No distress.  HEENT: head atraumatic, normocephalic, pupils equal and reactive to light, neck supple Cardiovascular: Normal rate, regular rhythm and normal heart sounds.  No murmur heard. No BLE edema. Pulmonary/Chest: Effort normal, coarse crackles on base. No respiratory distress. Abdominal: Soft.  There is no tenderness. Psychiatric: Patient has a normal mood and affect. behavior is normal. Judgment and thought content normal.   Recent Results (from the past 2160 hour(s))  POCT HgB A1C     Status: None   Collection Time: 10/14/23  9:19 AM  Result Value Ref Range   Hemoglobin A1C     HbA1c POC (<> result, manual entry)     HbA1c, POC (prediabetic range)     HbA1c, POC (controlled diabetic range)       PHQ2/9:    10/14/2023    9:18 AM 07/15/2023    9:12 AM 04/15/2023    9:37 AM 01/07/2023    7:57 AM 06/25/2022    8:46 AM  Depression screen PHQ 2/9  Decreased Interest 0 0 0 0 0  Down, Depressed, Hopeless 0 0 0 0 0  PHQ -  2 Score 0 0 0 0 0  Altered sleeping 0 0 0 0 0  Tired, decreased energy 0 3 0 0 0  Change in appetite 0 0 0 0 0  Feeling bad or failure about yourself  0 0 0 0 0  Trouble concentrating 0 0 0 0 0  Moving slowly or fidgety/restless 0 0 0 0 0  Suicidal thoughts 0 0 0 0 0  PHQ-9 Score 0 3 0 0 0  Difficult doing work/chores  Not difficult at all       phq 9  is negative   Fall Risk:    10/14/2023    9:18 AM 07/15/2023    9:12 AM 04/15/2023    9:37 AM 01/07/2023    7:57 AM 09/24/2022    7:43 AM  Fall Risk   Falls in the past year? 0 0 0 0 0  Number falls in past yr: 0   0 0  Injury with Fall? 0   0 0  Risk for fall due to : No Fall Risks No Fall Risks No Fall Risks No Fall Risks No Fall Risks  Follow up Falls prevention discussed Falls prevention discussed Falls prevention discussed Falls prevention discussed Falls prevention discussed      Functional Status Survey: Is the patient deaf or have difficulty hearing?: Yes Does the patient have difficulty seeing, even when wearing glasses/contacts?: No Does the patient have difficulty concentrating, remembering, or making decisions?: No Does the patient have difficulty walking or climbing stairs?: No Does the patient have difficulty dressing or bathing?: No Does the patient have difficulty doing errands alone such as visiting a doctor's office or shopping?: No    Assessment & Plan  1. Dyslipidemia associated with type 2 diabetes mellitus (HCC)  - POCT HgB A1C - Semaglutide, 1 MG/DOSE, (OZEMPIC, 1 MG/DOSE,) 4 MG/3ML SOPN; Inject 1 mg into the skin once a week.  Dispense: 9 mL; Refill: 1 - rosuvastatin (CRESTOR) 40 MG tablet; Take 1 tablet (40 mg total) by mouth daily.  Dispense: 90 tablet; Refill: 1  2. Need for immunization against influenza  - Flu vaccine trivalent PF, 6mos and older(Flulaval,Afluria,Fluarix,Fluzone)  3. Hypertension associated with type 2 diabetes mellitus (HCC)  - valsartan (DIOVAN) 320 MG tablet; Take 1  tablet (320 mg total) by mouth daily.  Dispense: 90 tablet; Refill: 0  4. Chronic bronchiectasis not affecting current episode of care (HCC)  Stable, under the care of Dr. Belia Heman  5. Vitamin D deficiency  He is taking otc supplementation   6. Mild intermittent asthma without complication  Doing well   7. Situs inversus   8. Diabetes mellitus with microalbuminuria (HCC)  - Dapagliflozin Pro-metFORMIN ER (XIGDUO XR) 09-999 MG TB24; Take 1 tablet by mouth daily.  Dispense: 90 tablet; Refill: 1 - Semaglutide, 1 MG/DOSE, (OZEMPIC, 1 MG/DOSE,) 4 MG/3ML SOPN; Inject 1 mg into the skin once a week.  Dispense: 9 mL; Refill: 1

## 2023-10-14 ENCOUNTER — Ambulatory Visit (INDEPENDENT_AMBULATORY_CARE_PROVIDER_SITE_OTHER): Payer: No Typology Code available for payment source | Admitting: Family Medicine

## 2023-10-14 ENCOUNTER — Encounter: Payer: Self-pay | Admitting: Family Medicine

## 2023-10-14 VITALS — BP 132/82 | HR 90 | Resp 16 | Ht 70.0 in | Wt 200.0 lb

## 2023-10-14 DIAGNOSIS — Z23 Encounter for immunization: Secondary | ICD-10-CM | POA: Diagnosis not present

## 2023-10-14 DIAGNOSIS — E1159 Type 2 diabetes mellitus with other circulatory complications: Secondary | ICD-10-CM

## 2023-10-14 DIAGNOSIS — E1169 Type 2 diabetes mellitus with other specified complication: Secondary | ICD-10-CM | POA: Diagnosis not present

## 2023-10-14 DIAGNOSIS — R809 Proteinuria, unspecified: Secondary | ICD-10-CM

## 2023-10-14 DIAGNOSIS — J4520 Mild intermittent asthma, uncomplicated: Secondary | ICD-10-CM

## 2023-10-14 DIAGNOSIS — E1129 Type 2 diabetes mellitus with other diabetic kidney complication: Secondary | ICD-10-CM

## 2023-10-14 DIAGNOSIS — L309 Dermatitis, unspecified: Secondary | ICD-10-CM

## 2023-10-14 DIAGNOSIS — E559 Vitamin D deficiency, unspecified: Secondary | ICD-10-CM | POA: Diagnosis not present

## 2023-10-14 DIAGNOSIS — I152 Hypertension secondary to endocrine disorders: Secondary | ICD-10-CM

## 2023-10-14 DIAGNOSIS — Q893 Situs inversus: Secondary | ICD-10-CM

## 2023-10-14 DIAGNOSIS — J479 Bronchiectasis, uncomplicated: Secondary | ICD-10-CM | POA: Diagnosis not present

## 2023-10-14 DIAGNOSIS — E785 Hyperlipidemia, unspecified: Secondary | ICD-10-CM

## 2023-10-14 DIAGNOSIS — Z7985 Long-term (current) use of injectable non-insulin antidiabetic drugs: Secondary | ICD-10-CM

## 2023-10-14 LAB — POCT GLYCOSYLATED HEMOGLOBIN (HGB A1C)

## 2023-10-14 MED ORDER — OZEMPIC (1 MG/DOSE) 4 MG/3ML ~~LOC~~ SOPN
1.0000 mg | PEN_INJECTOR | SUBCUTANEOUS | 1 refills | Status: DC
Start: 2023-10-14 — End: 2024-01-20

## 2023-10-14 MED ORDER — DAPAGLIFLOZIN PRO-METFORMIN ER 10-1000 MG PO TB24: 1.0000 | ORAL_TABLET | Freq: Every day | ORAL | 1 refills | Status: DC

## 2023-10-14 MED ORDER — VALSARTAN 320 MG PO TABS: 320.0000 mg | ORAL_TABLET | Freq: Every day | ORAL | 0 refills | Status: DC

## 2023-10-14 MED ORDER — ROSUVASTATIN CALCIUM 40 MG PO TABS
40.0000 mg | ORAL_TABLET | Freq: Every day | ORAL | 1 refills | Status: DC
Start: 2023-10-14 — End: 2024-01-20

## 2023-10-14 MED ORDER — TRIAMCINOLONE ACETONIDE 0.1 % EX CREA
1.0000 | TOPICAL_CREAM | Freq: Two times a day (BID) | CUTANEOUS | 0 refills | Status: AC
Start: 2023-10-14 — End: ?

## 2023-10-17 ENCOUNTER — Ambulatory Visit: Payer: No Typology Code available for payment source | Admitting: Family Medicine

## 2023-10-20 ENCOUNTER — Other Ambulatory Visit: Payer: Self-pay | Admitting: Family Medicine

## 2023-10-20 DIAGNOSIS — E1159 Type 2 diabetes mellitus with other circulatory complications: Secondary | ICD-10-CM

## 2023-10-20 DIAGNOSIS — I10 Essential (primary) hypertension: Secondary | ICD-10-CM

## 2023-10-21 LAB — HM DIABETES EYE EXAM

## 2023-11-04 ENCOUNTER — Ambulatory Visit: Payer: No Typology Code available for payment source | Admitting: Internal Medicine

## 2023-11-04 ENCOUNTER — Encounter: Payer: Self-pay | Admitting: Internal Medicine

## 2023-11-04 VITALS — BP 116/82 | HR 81 | Temp 97.6°F | Ht 70.0 in | Wt 204.6 lb

## 2023-11-04 DIAGNOSIS — J4520 Mild intermittent asthma, uncomplicated: Secondary | ICD-10-CM | POA: Diagnosis not present

## 2023-11-04 NOTE — Addendum Note (Signed)
Addended by: Erin Fulling on: 11/04/2023 09:58 AM   Modules accepted: Level of Service

## 2023-11-04 NOTE — Patient Instructions (Addendum)
Avoid secondhand smoke Avoid SICK contacts Recommend  Masking  when appropriate Recommend Keep up-to-date with vaccinations You are doing well without inhalers   Recommend chest x-ray for interval assessment in 1-2 weeks

## 2023-11-04 NOTE — Progress Notes (Signed)
   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    The CXR was Independently Reviewed By Me Today CXR reviewed-01/2018 RT lung opacity c/w pneumonia Recommend-repeat CXR   FLU SHOT up to date Nov 2024   BP 116/82 (BP Location: Left Arm, Patient Position: Sitting, Cuff Size: Normal)   Pulse 81   Temp 97.6 F (36.4 C) (Temporal)   Ht 5\' 10"  (1.778 m)   Wt 204 lb 9.6 oz (92.8 kg)   SpO2 99%   BMI 29.36 kg/m      Review of Systems: Gen:  Denies  fever, sweats, chills weight loss  HEENT: Denies blurred vision, double vision, ear pain, eye pain, hearing loss, nose bleeds, sore throat Cardiac:  No dizziness, chest pain or heaviness, chest tightness,edema, No JVD Resp:   No cough, -sputum production, -shortness of breath,-wheezing, -hemoptysis,  Other:  All other systems negative   Physical Examination:   General Appearance: No distress  EYES PERRLA, EOM intact.   NECK Supple, No JVD Pulmonary: normal breath sounds, No wheezing.  CardiovascularNormal S1,S2.  No m/r/g.   Abdomen: Benign, Soft, non-tender. Neurology UE/LE 5/5 strength, no focal deficits Ext pulses intact, cap refill intact ALL OTHER ROS ARE NEGATIVE      ASSESSMENT / PLAN:  47 year old white male with asthma  Last visit December 2023 where we discussed a plan to wean off Arnuity and assess respiratory status, At that time patient had well-controlled asthma and infrequent albuterol use  Currently, doing well off  inhalers   Previous history of pneumonia with lingular opacity on chest x-ray in 2019 Recommend follow-up chest x-ray for further assessment   No exacerbation at this time No evidence of heart failure at this time No evidence or signs of infection at this time No respiratory distress No fevers, chills, nausea, vomiting, diarrhea No evidence of lower extremity edema No evidence hemoptysis   Avoid secondhand smoke Avoid SICK contacts Recommend  Masking  when appropriate Recommend Keep up-to-date with vaccinations    MEDICATION ADJUSTMENTS/LABS AND TESTS ORDERED:  Patient satisfied with Plan of action and management. All questions answered  Follow-up in 1 year  Total time spent 32 minutes  Lucie Leather, M.D.  [facility]  Medical Director [facility] Medical Director [facility] Cardio-Pulmonary Department

## 2023-11-11 ENCOUNTER — Ambulatory Visit
Admission: RE | Admit: 2023-11-11 | Discharge: 2023-11-11 | Disposition: A | Discharge status: Home / Self Care | Payer: No Typology Code available for payment source | Attending: Internal Medicine | Admitting: Internal Medicine

## 2023-11-11 ENCOUNTER — Ambulatory Visit
Admission: RE | Admit: 2023-11-11 | Discharge: 2023-11-11 | Disposition: A | Discharge status: Home / Self Care | Payer: No Typology Code available for payment source | Source: Ambulatory Visit | Attending: Internal Medicine | Admitting: Internal Medicine

## 2023-11-11 DIAGNOSIS — J4520 Mild intermittent asthma, uncomplicated: Secondary | ICD-10-CM

## 2024-01-11 ENCOUNTER — Other Ambulatory Visit: Payer: Self-pay | Admitting: Family Medicine

## 2024-01-11 DIAGNOSIS — E1159 Type 2 diabetes mellitus with other circulatory complications: Secondary | ICD-10-CM

## 2024-01-13 ENCOUNTER — Ambulatory Visit: Payer: No Typology Code available for payment source | Admitting: Family Medicine

## 2024-01-20 ENCOUNTER — Encounter: Payer: Self-pay | Admitting: Family Medicine

## 2024-01-20 ENCOUNTER — Ambulatory Visit: Payer: No Typology Code available for payment source | Admitting: Family Medicine

## 2024-01-20 VITALS — BP 120/78 | HR 74 | Temp 98.0°F | Resp 16 | Ht 70.0 in | Wt 201.5 lb

## 2024-01-20 DIAGNOSIS — I152 Hypertension secondary to endocrine disorders: Secondary | ICD-10-CM

## 2024-01-20 DIAGNOSIS — E1169 Type 2 diabetes mellitus with other specified complication: Secondary | ICD-10-CM | POA: Diagnosis not present

## 2024-01-20 DIAGNOSIS — E785 Hyperlipidemia, unspecified: Secondary | ICD-10-CM

## 2024-01-20 DIAGNOSIS — R809 Proteinuria, unspecified: Secondary | ICD-10-CM

## 2024-01-20 DIAGNOSIS — E1129 Type 2 diabetes mellitus with other diabetic kidney complication: Secondary | ICD-10-CM

## 2024-01-20 DIAGNOSIS — E559 Vitamin D deficiency, unspecified: Secondary | ICD-10-CM

## 2024-01-20 DIAGNOSIS — E1159 Type 2 diabetes mellitus with other circulatory complications: Secondary | ICD-10-CM | POA: Diagnosis not present

## 2024-01-20 DIAGNOSIS — Q893 Situs inversus: Secondary | ICD-10-CM

## 2024-01-20 DIAGNOSIS — J479 Bronchiectasis, uncomplicated: Secondary | ICD-10-CM

## 2024-01-20 DIAGNOSIS — J4520 Mild intermittent asthma, uncomplicated: Secondary | ICD-10-CM

## 2024-01-20 LAB — POCT GLYCOSYLATED HEMOGLOBIN (HGB A1C): Hemoglobin A1C: 6.5 % — AB (ref 4.0–5.6)

## 2024-01-20 MED ORDER — VALSARTAN 320 MG PO TABS: 320.0000 mg | ORAL_TABLET | Freq: Every day | ORAL | 0 refills | Status: DC

## 2024-01-20 MED ORDER — DAPAGLIFLOZIN PRO-METFORMIN ER 10-1000 MG PO TB24: 1.0000 | ORAL_TABLET | Freq: Every day | ORAL | 1 refills | Status: DC

## 2024-01-20 MED ORDER — ROSUVASTATIN CALCIUM 40 MG PO TABS: 40.0000 mg | ORAL_TABLET | Freq: Every day | ORAL | 1 refills | Status: DC

## 2024-01-20 MED ORDER — SEMAGLUTIDE (2 MG/DOSE) 8 MG/3ML ~~LOC~~ SOPN: 2.0000 mg | PEN_INJECTOR | SUBCUTANEOUS | 1 refills | Status: DC

## 2024-01-20 NOTE — Progress Notes (Signed)
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  His last urine micro went down from 270 to 62 and lup to 47 and last time it was up again at 101 , he is on SGL-2 agonist  and ARB. He has associated dyslipidemia and takes statins. He denies polyphagia, polyuria, polydipsia. He is packing his lunch    HTN: He was on Lotrel 5/40 , after that  Exforge , down to Diovan 320 and since 2021 on lower dose of Diovan 160 mg .BP has been in the 130's lately so we will adjusted  dose of Valsartan to 320 mg back in October 24 and bp is at goal today. He denies chest pain, palpitation or dizziness.    Bilateral hearing loss: he started using hearing aids and he states he is doing well    Dyslipidemia  he is on Crestor 40 mg last LDL was 73 and improved , we will recheck next visit   Asthma/Bronchiectasy: He is under the care of Dr. Belia Heman, he sees him once a year,  he coughs occasionally, and also intermittent wheezing, he has been off Arnuity and is doing well    IMPRESSION: CT 2017 1. No evidence acute pulmonary embolism. 2. No acute pulmonary parenchymal abnormality. 3. Chronic bronchiectasis in the lingula. 4. Situs inversus. 5. Situs inversus with chronic bronchiectasis. Recommend correlation for Kartagener's syndrome.   Vitamin D : Last level back at goal, taking otc supplementation    Abnormal CT Chest: CT angio from 09/2016 noted: Situs inversus with chronic bronchiectasis (in the lingula). Impression corroborated with chest x-ray done on 01/2018. He saw Dr. Belia Heman and was diagnosed with asthma, but not on medications at this time    Patient Active  Problem List   Diagnosis Date Noted   Diabetes mellitus with microalbuminuria (HCC) 03/06/2021   Hypertension associated with type 2 diabetes mellitus (HCC) 03/06/2021   Chronic bronchiectasis not affecting current episode of care (HCC) 03/06/2021   Vitamin D deficiency 03/06/2021   Situs inversus 03/03/2018   Dyslipidemia associated with type 2 diabetes mellitus (HCC) 08/13/2016   Dermatitis of lower extremity 01/23/2016   Elevated liver enzymes 07/25/2015   Mild intermittent asthma without complication 03/30/2015   Type 2 diabetes mellitus with obesity (HCC) 03/30/2015   Difficulty hearing 03/30/2015   Dyslipidemia 03/30/2015   Benign hypertension 03/30/2015    Past Surgical History:  Procedure Laterality Date   BILIARY TUBE PLACEMENT ([facility] HX)     COLONOSCOPY WITH PROPOFOL N/A 04/10/2021   Procedure: COLONOSCOPY WITH PROPOFOL;  Surgeon: Wyline Mood, MD;  Location: [facility] ENDOSCOPY;  Service: Gastroenterology;  Laterality: N/A;   NASAL SINUS SURGERY     RHINOPLASTY      Family History  Problem Relation Age of Onset   Diabetes Mother    Hypertension Father    Diabetes Brother    Depression Paternal Grandmother    Diabetes Paternal Grandmother    Hypertension Paternal Grandfather    Heart disease Paternal Grandfather  Cancer Paternal Grandfather        Prostate    Social History   Tobacco Use   Smoking status: Never   Smokeless tobacco: Never  Substance Use Topics   Alcohol use: No     Current Outpatient Medications:    albuterol (VENTOLIN HFA) 108 (90 Base) MCG/ACT inhaler, Inhale 2 puffs into the lungs every 6 (six) hours as needed for wheezing or shortness of breath., Disp: 18 g, Rfl: 12   Cholecalciferol (VITAMIN D) 2000 units CAPS, Take 1 capsule (2,000 Units total) by mouth daily., Disp: 30 capsule, Rfl: 3   Dapagliflozin Pro-metFORMIN ER (XIGDUO XR) 09-999 MG TB24, Take 1 tablet by mouth daily., Disp: 90 tablet, Rfl: 1   Multiple Vitamin (MULTIVITAMIN)  tablet, Take 1 tablet by mouth daily., Disp: , Rfl:    rosuvastatin (CRESTOR) 40 MG tablet, Take 1 tablet (40 mg total) by mouth daily., Disp: 90 tablet, Rfl: 1   Semaglutide, 1 MG/DOSE, (OZEMPIC, 1 MG/DOSE,) 4 MG/3ML SOPN, Inject 1 mg into the skin once a week., Disp: 9 mL, Rfl: 1   triamcinolone cream (KENALOG) 0.1 %, Apply 1 Application topically 2 (two) times daily., Disp: 453.6 g, Rfl: 0   valsartan (DIOVAN) 320 MG tablet, Take 1 tablet by mouth once daily, Disp: 90 tablet, Rfl: 0   vitamin E 400 UNIT capsule, Take 1 capsule by mouth 2 (two) times daily., Disp: , Rfl:   No Known Allergies  I personally reviewed active problem list, medication list, allergies, family history with the patient/caregiver today.   ROS  Ten systems reviewed and is negative except as mentioned in HPI    Objective  Vitals:   01/20/24 0914  BP: 120/78  Pulse: 74  Resp: 16  Temp: 98 F (36.7 C)  TempSrc: Oral  SpO2: 94%  Weight: 201 lb 8 oz (91.4 kg)  Height: 5\' 10"  (1.778 m)    Body mass index is 28.91 kg/m.  Physical Exam  Constitutional: Patient appears well-developed and well-nourished. Obese  No distress.  HEENT: head atraumatic, normocephalic, pupils equal and reactive to light, , neck supple, throat within normal limits Cardiovascular: Normal rate, regular rhythm and normal heart sounds.  No murmur heard. No BLE edema. Pulmonary/Chest: Effort normal and breath sounds normal. No respiratory distress. Abdominal: Soft.  There is no tenderness. Psychiatric: Patient has a normal mood and affect. behavior is normal. Judgment and thought content normal.   Diabetic Foot Exam:     PHQ2/9:    01/20/2024    9:08 AM 10/14/2023    9:18 AM 07/15/2023    9:12 AM 04/15/2023    9:37 AM 01/07/2023    7:57 AM  Depression screen PHQ 2/9  Decreased Interest 0 0 0 0 0  Down, Depressed, Hopeless 0 0 0 0 0  PHQ - 2 Score 0 0 0 0 0  Altered sleeping 0 0 0 0 0  Tired, decreased energy 0 0 3 0 0   Change in appetite 0 0 0 0 0  Feeling bad or failure about yourself  0 0 0 0 0  Trouble concentrating 0 0 0 0 0  Moving slowly or fidgety/restless 0 0 0 0 0  Suicidal thoughts 0 0 0 0 0  PHQ-9 Score 0 0 3 0 0  Difficult doing work/chores Not difficult at all  Not difficult at all      phq 9 is negative  Fall Risk:    01/20/2024    9:08 AM 10/14/2023  9:18 AM 07/15/2023    9:12 AM 04/15/2023    9:37 AM 01/07/2023    7:57 AM  Fall Risk   Falls in the past year? 0 0 0 0 0  Number falls in past yr: 0 0   0  Injury with Fall? 0 0   0  Risk for fall due to : No Fall Risks No Fall Risks No Fall Risks No Fall Risks No Fall Risks  Follow up Falls prevention discussed;Education provided;Falls evaluation completed Falls prevention discussed Falls prevention discussed Falls prevention discussed Falls prevention discussed     Assessment & Plan  1. Dyslipidemia associated with type 2 diabetes mellitus (HCC) (Primary)  - POCT glycosylated hemoglobin (Hb A1C) - Dapagliflozin Pro-metFORMIN ER (XIGDUO XR) 09-999 MG TB24; Take 1 tablet by mouth daily.  Dispense: 90 tablet; Refill: 1 - rosuvastatin (CRESTOR) 40 MG tablet; Take 1 tablet (40 mg total) by mouth daily.  Dispense: 90 tablet; Refill: 1 - Semaglutide, 2 MG/DOSE, 8 MG/3ML SOPN; Inject 2 mg as directed once a week.  Dispense: 9 mL; Refill: 1  2. Chronic bronchiectasis not affecting current episode of care (HCC)  stable  3. Hypertension associated with type 2 diabetes mellitus (HCC)  - valsartan (DIOVAN) 320 MG tablet; Take 1 tablet (320 mg total) by mouth daily.  Dispense: 90 tablet; Refill: 0  4. Mild intermittent asthma without complication  Continue prn medication and visits with Dr. Belia Heman  5. Situs inversus   6. Vitamin D deficiency  Continue supplementation  7. Diabetes mellitus with microalbuminuria (HCC)  - Dapagliflozin Pro-metFORMIN ER (XIGDUO XR) 09-999 MG TB24; Take 1 tablet by mouth daily.  Dispense: 90  tablet; Refill: 1

## 2024-04-15 ENCOUNTER — Telehealth: Payer: Self-pay

## 2024-04-15 NOTE — Telephone Encounter (Signed)
 PA done waiting on insurance to determine

## 2024-04-15 NOTE — Telephone Encounter (Signed)
 Copied from CRM [id number]. Topic: Clinical - Prescription Issue >> Apr 15, 2024  8:25 AM Andrew Sawyer wrote: Reason for CRM: The patient has been directed by their insurance company to  contact their PCP and request a prior authorization for their prescription for Semaglutide , 2 MG/DOSE, 8 MG/3ML SOPN [[id number]]  Please contact the patient further if needed

## 2024-05-04 ENCOUNTER — Ambulatory Visit: Payer: Self-pay | Admitting: Family Medicine

## 2024-05-04 ENCOUNTER — Encounter: Payer: Self-pay | Admitting: Family Medicine

## 2024-05-04 VITALS — BP 114/76 | HR 93 | Resp 16 | Ht 70.0 in | Wt 196.7 lb

## 2024-05-04 DIAGNOSIS — E559 Vitamin D deficiency, unspecified: Secondary | ICD-10-CM

## 2024-05-04 DIAGNOSIS — E785 Hyperlipidemia, unspecified: Secondary | ICD-10-CM

## 2024-05-04 DIAGNOSIS — I152 Hypertension secondary to endocrine disorders: Secondary | ICD-10-CM

## 2024-05-04 DIAGNOSIS — E1169 Type 2 diabetes mellitus with other specified complication: Secondary | ICD-10-CM

## 2024-05-04 DIAGNOSIS — R809 Proteinuria, unspecified: Secondary | ICD-10-CM

## 2024-05-04 DIAGNOSIS — J479 Bronchiectasis, uncomplicated: Secondary | ICD-10-CM

## 2024-05-04 DIAGNOSIS — E1129 Type 2 diabetes mellitus with other diabetic kidney complication: Secondary | ICD-10-CM

## 2024-05-04 DIAGNOSIS — E1159 Type 2 diabetes mellitus with other circulatory complications: Secondary | ICD-10-CM

## 2024-05-04 DIAGNOSIS — J4520 Mild intermittent asthma, uncomplicated: Secondary | ICD-10-CM

## 2024-05-04 DIAGNOSIS — Q893 Situs inversus: Secondary | ICD-10-CM

## 2024-05-04 LAB — POCT GLYCOSYLATED HEMOGLOBIN (HGB A1C): Hemoglobin A1C: 6.1 % — AB (ref 4.0–5.6)

## 2024-05-04 MED ORDER — VALSARTAN 320 MG PO TABS: 320.0000 mg | ORAL_TABLET | Freq: Every day | ORAL | 1 refills | Status: DC

## 2024-05-04 NOTE — Progress Notes (Signed)
 Name: Andrew Sawyer   MRN: [id number]    DOB: 14-Jan-1976   Date:05/04/2024       Progress Note  Subjective  Chief Complaint  Chief Complaint  Patient presents with   Medical Management of Chronic Issues   Discussed the use of AI scribe software for clinical note transcription with the patient, who gave verbal consent to proceed.  History of Present Illness Andrew Sawyer is a 48 year old male with diabetes, hyperlipidemia, hypertension, and bronchial disease who presents for a regular follow-up visit.  He is here for a regular follow-up visit to discuss his diabetes, hyperlipidemia, hypertension, and bronchial disease. His last visit was in January. His blood pressure at home is typically between 114/76 and 120/78, with no dizziness, chest pain, or palpitations. His weight has decreased from 202 pounds in January to 196 pounds currently, attributed to his usual routine, aiming to reach 185-190 pounds. He tries to cut down on carbohydrates to manage his diabetes, although it's not always consistent. No excessive hunger, thirst, or increased urination.  For diabetes management, he takes Xigduo  09/999 mg once daily and Ozempic  2 mg weekly, with no side effects. His A1c has improved from 6.5 to 6.1. 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  Regarding hyperlipidemia, he takes Crestor  (rosuvastatin ) 40 mg with no muscle pain. For hypertension, he is on valsartan  320 mg since 2021, previously on Lotrel, with well-controlled blood pressure.  He has a history of bilateral hearing loss and owns hearing aids, though they are not with him today as they are charging in his truck.  He has asthma and bronchiectasis, under the care of a pulmonologist. He reports occasional wheezing and uses an emergency albuterol  inhaler as needed. He is no longer on Arnuity and coughs up secretions all throughout the day   He takes over-the-counter vitamin D  daily for  vitamin D  deficiency.     Patient Active Problem List   Diagnosis Date Noted   Diabetes mellitus with microalbuminuria (HCC) 03/06/2021   Hypertension associated with type 2 diabetes mellitus (HCC) 03/06/2021   Chronic bronchiectasis not affecting current episode of care (HCC) 03/06/2021   Vitamin D  deficiency 03/06/2021   Situs inversus 03/03/2018   Dyslipidemia associated with type 2 diabetes mellitus (HCC) 08/13/2016   Dermatitis of lower extremity 01/23/2016   Elevated liver enzymes 07/25/2015   Mild intermittent asthma without complication 03/30/2015   Type 2 diabetes mellitus with obesity (HCC) 03/30/2015   Difficulty hearing 03/30/2015   Dyslipidemia 03/30/2015   Benign hypertension 03/30/2015    Past Surgical History:  Procedure Laterality Date   BILIARY TUBE PLACEMENT ([facility] HX)     COLONOSCOPY WITH PROPOFOL  N/A 04/10/2021   Procedure: COLONOSCOPY WITH PROPOFOL ;  Surgeon: Luke Salaam, MD;  Location: [facility] ENDOSCOPY;  Service: Gastroenterology;  Laterality: N/A;   NASAL SINUS SURGERY     RHINOPLASTY      Family History  Problem Relation Age of Onset   Diabetes Mother    Hypertension Father    Diabetes Brother    Depression Paternal Grandmother    Diabetes Paternal Grandmother    Hypertension Paternal Grandfather    Heart disease Paternal Grandfather    Cancer Paternal Grandfather        Prostate    Social History   Tobacco Use   Smoking status: Never   Smokeless tobacco: Never  Substance Use Topics   Alcohol use: No     Current  Outpatient Medications:    albuterol  (VENTOLIN  HFA) 108 (90 Base) MCG/ACT inhaler, Inhale 2 puffs into the lungs every 6 (six) hours as needed for wheezing or shortness of breath., Disp: 18 g, Rfl: 12   Cholecalciferol (VITAMIN D ) 2000 units CAPS, Take 1 capsule (2,000 Units total) by mouth daily., Disp: 30 capsule, Rfl: 3   Dapagliflozin  Pro-metFORMIN  ER (XIGDUO  XR) 09-999 MG TB24, Take 1 tablet by mouth daily., Disp: 90 tablet,  Rfl: 1   Multiple Vitamin (MULTIVITAMIN) tablet, Take 1 tablet by mouth daily., Disp: , Rfl:    rosuvastatin  (CRESTOR ) 40 MG tablet, Take 1 tablet (40 mg total) by mouth daily., Disp: 90 tablet, Rfl: 1   Semaglutide , 2 MG/DOSE, 8 MG/3ML SOPN, Inject 2 mg as directed once a week., Disp: 9 mL, Rfl: 1   triamcinolone  cream (KENALOG ) 0.1 %, Apply 1 Application topically 2 (two) times daily., Disp: 453.6 g, Rfl: 0   valsartan  (DIOVAN ) 320 MG tablet, Take 1 tablet (320 mg total) by mouth daily., Disp: 90 tablet, Rfl: 0   vitamin E 400 UNIT capsule, Take 1 capsule by mouth 2 (two) times daily., Disp: , Rfl:   No Known Allergies  I personally reviewed active problem list, medication list, allergies with the patient/caregiver today.   ROS  Ten systems reviewed and is negative except as mentioned in HPI    Objective Physical Exam  CONSTITUTIONAL: Patient appears well-developed and well-nourished. No distress. HEENT: Head atraumatic, normocephalic, neck supple. CARDIOVASCULAR: Normal rate, regular rhythm and normal heart sounds. No murmur heard. No BLE edema. PULMONARY: Effort normal. Bronchi present in left lung. No respiratory distress. ABDOMINAL: There is no tenderness or distention. MUSCULOSKELETAL: Normal gait. Without gross motor or sensory deficit. PSYCHIATRIC: Patient has a normal mood and affect. Behavior is normal. Judgment and thought content normal.  Vitals:   05/04/24 0840  BP: 114/76  Pulse: 93  Resp: 16  SpO2: 97%  Weight: 196 lb 11.2 oz (89.2 kg)  Height: 5\' 10"  (1.778 m)    Body mass index is 28.22 kg/m.  Recent Results (from the past 2160 hours)  POCT glycosylated hemoglobin (Hb A1C)     Status: Abnormal   Collection Time: 05/04/24  8:46 AM  Result Value Ref Range   Hemoglobin A1C 6.1 (A) 4.0 - 5.6 %   HbA1c POC (<> result, manual entry)     HbA1c, POC (prediabetic range)     HbA1c, POC (controlled diabetic range)       PHQ2/9:    05/04/2024    8:31 AM  01/20/2024    9:08 AM 10/14/2023    9:18 AM 07/15/2023    9:12 AM 04/15/2023    9:37 AM  Depression screen PHQ 2/9  Decreased Interest 0 0 0 0 0  Down, Depressed, Hopeless 0 0 0 0 0  PHQ - 2 Score 0 0 0 0 0  Altered sleeping  0 0 0 0  Tired, decreased energy  0 0 3 0  Change in appetite  0 0 0 0  Feeling bad or failure about yourself   0 0 0 0  Trouble concentrating  0 0 0 0  Moving slowly or fidgety/restless  0 0 0 0  Suicidal thoughts  0 0 0 0  PHQ-9 Score  0 0 3 0  Difficult doing work/chores  Not difficult at all  Not difficult at all     phq 9 is negative  Fall Risk:    05/04/2024    8:31 AM  01/20/2024    9:08 AM 10/14/2023    9:18 AM 07/15/2023    9:12 AM 04/15/2023    9:37 AM  Fall Risk   Falls in the past year? 0 0 0 0 0  Number falls in past yr: 0 0 0    Injury with Fall? 0 0 0    Risk for fall due to : No Fall Risks No Fall Risks No Fall Risks No Fall Risks No Fall Risks  Follow up Falls prevention discussed;Education provided;Falls evaluation completed Falls prevention discussed;Education provided;Falls evaluation completed Falls prevention discussed Falls prevention discussed Falls prevention discussed      Assessment & Plan Type 2 diabetes mellitus with hyperlipidemia Diabetes well-controlled with A1c improved to 6.1. Medications may aid weight loss without side effects. Hyperlipidemia managed with rosuvastatin , no muscle pain reported. - Continue Xigduo  09/999 mg daily. - Continue Ozempic  2 mg weekly. - Continue rosuvastatin  40 mg daily. - Order lipid panel. - Order comprehensive metabolic panel including kidney and liver function tests. - Order CBC. - Encourage dietary modifications to reduce carbohydrate intake.  Hypertension Blood pressure well-controlled with current regimen. Previous medication reduction increased blood pressure. - Continue valsartan  320 mg daily. - Refill valsartan  prescription. - Monitor blood pressure regularly at home.  Chronic  bronchitis with bronchiectasis Managed with emergency albuterol . Occasional wheezing and morning cough with secretions. Imaging shows no significant changes. - Encourage daily secretion clearance. - Discuss symptoms with pulmonologist, especially persistent cough.  Mild intermittent asthma Managed with emergency albuterol . Occasional wheezing, no significant changes after discontinuing regular inhaler. - Maintain emergency albuterol  inhaler for use as needed. - Discuss asthma management with pulmonologist.  Vitamin D  deficiency Managed with compliant over-the-counter supplementation. - Continue over-the-counter vitamin D  supplementation.  Bilateral hearing loss Has hearing aids but not using them due to charging issues. - Encourage regular use of hearing aids.

## 2024-05-05 ENCOUNTER — Ambulatory Visit: Payer: Self-pay | Admitting: Family Medicine

## 2024-05-05 LAB — CBC WITH DIFFERENTIAL/PLATELET
Absolute Lymphocytes: 1709 {cells}/uL (ref 850–3900)
Absolute Monocytes: 757 {cells}/uL (ref 200–950)
Basophils Absolute: 27 {cells}/uL (ref 0–200)
Basophils Relative: 0.3 %
Eosinophils Absolute: 338 {cells}/uL (ref 15–500)
Eosinophils Relative: 3.8 %
HCT: 51.2 % — ABNORMAL HIGH (ref 38.5–50.0)
Hemoglobin: 16.7 g/dL (ref 13.2–17.1)
MCH: 27.4 pg (ref 27.0–33.0)
MCHC: 32.6 g/dL (ref 32.0–36.0)
MCV: 84.1 fL (ref 80.0–100.0)
MPV: 9.8 fL (ref 7.5–12.5)
Monocytes Relative: 8.5 %
Neutro Abs: 6070 {cells}/uL (ref 1500–7800)
Neutrophils Relative %: 68.2 %
Platelets: 323 10*3/uL (ref 140–400)
RBC: 6.09 10*6/uL — ABNORMAL HIGH (ref 4.20–5.80)
RDW: 13.5 % (ref 11.0–15.0)
Total Lymphocyte: 19.2 %
WBC: 8.9 10*3/uL (ref 3.8–10.8)

## 2024-05-05 LAB — LIPID PANEL
Cholesterol: 140 mg/dL (ref <200)
HDL: 45 mg/dL (ref >=40)
LDL Cholesterol (Calc): 75 mg/dL
Non-HDL Cholesterol (Calc): 95 mg/dL (ref <130)
Total CHOL/HDL Ratio: 3.1 (calc) (ref <5.0)
Triglycerides: 110 mg/dL (ref <150)

## 2024-05-05 LAB — COMPREHENSIVE METABOLIC PANEL WITH GFR
AG Ratio: 1.7 (calc) (ref 1.0–2.5)
ALT: 14 U/L (ref 9–46)
AST: 15 U/L (ref 10–40)
Albumin: 4.7 g/dL (ref 3.6–5.1)
Alkaline phosphatase (APISO): 82 U/L (ref 36–130)
BUN: 16 mg/dL (ref 7–25)
CO2: 30 mmol/L (ref 20–32)
Calcium: 9.7 mg/dL (ref 8.6–10.3)
Chloride: 105 mmol/L (ref 98–110)
Creat: 0.93 mg/dL (ref 0.60–1.29)
Globulin: 2.7 g/dL (ref 1.9–3.7)
Glucose, Bld: 113 mg/dL — ABNORMAL HIGH (ref 65–99)
Potassium: 4.5 mmol/L (ref 3.5–5.3)
Sodium: 141 mmol/L (ref 135–146)
Total Bilirubin: 0.4 mg/dL (ref 0.2–1.2)
Total Protein: 7.4 g/dL (ref 6.1–8.1)
eGFR: 101 mL/min/{1.73_m2} (ref >=60)

## 2024-05-05 LAB — MICROALBUMIN / CREATININE URINE RATIO
Creatinine, Urine: 146 mg/dL (ref 20–320)
Microalb Creat Ratio: 16 mg/g{creat} (ref <30)
Microalb, Ur: 2.4 mg/dL

## 2024-05-05 LAB — VITAMIN D 25 HYDROXY (VIT D DEFICIENCY, FRACTURES): Vit D, 25-Hydroxy: 52 ng/mL (ref 30–100)

## 2024-05-13 ENCOUNTER — Telehealth: Payer: Self-pay | Admitting: Pharmacy Technician

## 2024-05-13 ENCOUNTER — Other Ambulatory Visit (HOSPITAL_COMMUNITY): Payer: Self-pay

## 2024-05-13 NOTE — Telephone Encounter (Signed)
 Pharmacy Patient Advocate Encounter   Received notification from CoverMyMeds that prior authorization for Xigduo  XR 10-1000MG  er tablets is required/requested.   Insurance verification completed.   The patient is insured through [facility] .   Per test claim: The current 30 day co-pay is, $25.00.  No PA needed at this time. This test claim was processed through [facility]- copay amounts may vary at other pharmacies due to pharmacy/plan contracts, or as the patient moves through the different stages of their insurance plan.

## 2024-05-14 ENCOUNTER — Other Ambulatory Visit (HOSPITAL_COMMUNITY): Payer: Self-pay

## 2024-05-15 ENCOUNTER — Other Ambulatory Visit (HOSPITAL_COMMUNITY): Payer: Self-pay

## 2024-05-20 ENCOUNTER — Other Ambulatory Visit (HOSPITAL_COMMUNITY): Payer: Self-pay

## 2024-05-22 ENCOUNTER — Other Ambulatory Visit (HOSPITAL_COMMUNITY): Payer: Self-pay

## 2024-07-04 ENCOUNTER — Other Ambulatory Visit: Payer: Self-pay | Admitting: Family Medicine

## 2024-07-04 DIAGNOSIS — E1169 Type 2 diabetes mellitus with other specified complication: Secondary | ICD-10-CM

## 2024-07-06 ENCOUNTER — Other Ambulatory Visit: Payer: Self-pay

## 2024-07-06 DIAGNOSIS — E1169 Type 2 diabetes mellitus with other specified complication: Secondary | ICD-10-CM

## 2024-07-06 MED ORDER — SEMAGLUTIDE (2 MG/DOSE) 8 MG/3ML ~~LOC~~ SOPN: 2.0000 mg | PEN_INJECTOR | SUBCUTANEOUS | 1 refills | Status: DC

## 2024-07-06 NOTE — Telephone Encounter (Signed)
 I advised it was being refused due to requesting it soon but he states he only has enough till next week

## 2024-07-06 NOTE — Telephone Encounter (Signed)
 Copied from CRM [id number]. Topic: Clinical - Prescription Issue >> Jul 06, 2024  2:13 PM Corin V wrote: Reason for CRM: Patient stated that [facility] contacted him stating Dr. Glenard denied refilling his Semaglutide  prescription. No note in chart regarding denial or refill. Please advise if there is a reason for denial or is a new script needs to be sent to [facility].

## 2024-07-06 NOTE — Addendum Note (Signed)
 Addended by: RENTERIA-GARCIA, Salaya Holtrop on: 07/06/2024 04:08 PM   Modules accepted: Orders

## 2024-09-07 ENCOUNTER — Ambulatory Visit: Admitting: Family Medicine

## 2024-09-07 ENCOUNTER — Encounter: Payer: Self-pay | Admitting: Family Medicine

## 2024-09-07 VITALS — BP 128/82 | HR 76 | Resp 16 | Ht 70.0 in | Wt 197.0 lb

## 2024-09-07 DIAGNOSIS — K760 Fatty (change of) liver, not elsewhere classified: Secondary | ICD-10-CM

## 2024-09-07 DIAGNOSIS — I152 Hypertension secondary to endocrine disorders: Secondary | ICD-10-CM

## 2024-09-07 DIAGNOSIS — J479 Bronchiectasis, uncomplicated: Secondary | ICD-10-CM

## 2024-09-07 DIAGNOSIS — J4520 Mild intermittent asthma, uncomplicated: Secondary | ICD-10-CM | POA: Diagnosis not present

## 2024-09-07 DIAGNOSIS — E1169 Type 2 diabetes mellitus with other specified complication: Secondary | ICD-10-CM

## 2024-09-07 DIAGNOSIS — Q893 Situs inversus: Secondary | ICD-10-CM

## 2024-09-07 DIAGNOSIS — E1159 Type 2 diabetes mellitus with other circulatory complications: Secondary | ICD-10-CM

## 2024-09-07 DIAGNOSIS — Z23 Encounter for immunization: Secondary | ICD-10-CM | POA: Diagnosis not present

## 2024-09-07 DIAGNOSIS — E559 Vitamin D deficiency, unspecified: Secondary | ICD-10-CM

## 2024-09-07 DIAGNOSIS — E785 Hyperlipidemia, unspecified: Secondary | ICD-10-CM

## 2024-09-07 DIAGNOSIS — E1129 Type 2 diabetes mellitus with other diabetic kidney complication: Secondary | ICD-10-CM

## 2024-09-07 DIAGNOSIS — R809 Proteinuria, unspecified: Secondary | ICD-10-CM

## 2024-09-07 LAB — POCT GLYCOSYLATED HEMOGLOBIN (HGB A1C): Hemoglobin A1C: 5.9 % — AB (ref 4.0–5.6)

## 2024-09-07 MED ORDER — VALSARTAN 320 MG PO TABS: 320.0000 mg | ORAL_TABLET | Freq: Every day | ORAL | 1 refills | Status: AC

## 2024-09-07 MED ORDER — DAPAGLIFLOZIN PRO-METFORMIN ER 10-1000 MG PO TB24: 1.0000 | ORAL_TABLET | Freq: Every day | ORAL | 1 refills | Status: AC

## 2024-09-07 MED ORDER — ROSUVASTATIN CALCIUM 40 MG PO TABS: 40.0000 mg | ORAL_TABLET | Freq: Every day | ORAL | 1 refills | Status: AC

## 2024-09-07 NOTE — Progress Notes (Signed)
 Name: MACY LINGENFELTER   MRN: [id number]    DOB: [birth year]   Date:09/07/2024       Progress Note  Subjective  Chief Complaint  Chief Complaint  Patient presents with   Medical Management of Chronic Issues   Discussed the use of AI scribe software for clinical note transcription with the patient, who gave verbal consent to proceed.  History of Present Illness He is here for a regular follow-up visit to discuss his diabetes, hyperlipidemia, hypertension, and bronchial disease.    For diabetes management, he takes Xigduo  09/999 mg once daily and Ozempic  2 mg weekly, with no side effects. His A1c has improved from 6.1 to 5.9 %  He denies polyphagia, polydipsia or polyuria. Denies hypoglycemic episodes and states fasting glucose between 115-130 . He is compliant with medications and denies side effects   Regarding hyperlipidemia, he takes Crestor  (rosuvastatin ) 40 mg with no muscle pain. For hypertension, he is on valsartan  320 mg since 2021, previously on Lotrel, with well-controlled blood pressure and no orthostatic changes   He has a history of bilateral hearing loss and owns hearing aids   He has asthma and bronchiectasis, under the care of a pulmonologist. He reports occasional wheezing and uses an emergency albuterol  inhaler as needed. He is no longer on Arnuity and coughs up secretions all throughout the day  but stable    He takes over-the-counter vitamin D  daily for vitamin D  deficiency.      Patient Active Problem List   Diagnosis Date Noted   Diabetes mellitus with microalbuminuria (HCC) 03/06/2021   Hypertension associated with type 2 diabetes mellitus (HCC) 03/06/2021   Chronic bronchiectasis not affecting current episode of care (HCC) 03/06/2021   Vitamin D  deficiency 03/06/2021   Situs inversus 03/03/2018   Dyslipidemia associated with type 2 diabetes mellitus (HCC) 08/13/2016   Dermatitis of lower extremity 01/23/2016   Elevated liver enzymes 07/25/2015   Mild  intermittent asthma without complication 03/30/2015   Type 2 diabetes mellitus with obesity (HCC) 03/30/2015   Difficulty hearing 03/30/2015   Dyslipidemia 03/30/2015   Benign hypertension 03/30/2015    Past Surgical History:  Procedure Laterality Date   BILIARY TUBE PLACEMENT ([facility] HX)     COLONOSCOPY WITH PROPOFOL  N/A 04/10/2021   Procedure: COLONOSCOPY WITH PROPOFOL ;  Surgeon: Therisa Bi, MD;  Location: [facility] ENDOSCOPY;  Service: Gastroenterology;  Laterality: N/A;   NASAL SINUS SURGERY     RHINOPLASTY      Family History  Problem Relation Age of Onset   Diabetes Mother    Hypertension Father    Diabetes Brother    Depression Paternal Grandmother    Diabetes Paternal Grandmother    Hypertension Paternal Grandfather    Heart disease Paternal Grandfather    Cancer Paternal Grandfather        Prostate    Social History   Tobacco Use   Smoking status: Never   Smokeless tobacco: Never  Substance Use Topics   Alcohol use: No     Current Outpatient Medications:    albuterol  (VENTOLIN  HFA) 108 (90 Base) MCG/ACT inhaler, Inhale 2 puffs into the lungs every 6 (six) hours as needed for wheezing or shortness of breath., Disp: 18 g, Rfl: 12   Cholecalciferol (VITAMIN D ) 2000 units CAPS, Take 1 capsule (2,000 Units total) by mouth daily., Disp: 30 capsule, Rfl: 3   Multiple Vitamin (MULTIVITAMIN) tablet, Take 1 tablet by mouth daily., Disp: , Rfl:    Semaglutide , 2 MG/DOSE, 8 MG/3ML SOPN, Inject  2 mg as directed once a week., Disp: 9 mL, Rfl: 1   triamcinolone  cream (KENALOG ) 0.1 %, Apply 1 Application topically 2 (two) times daily., Disp: 453.6 g, Rfl: 0   vitamin E 400 UNIT capsule, Take 1 capsule by mouth 2 (two) times daily., Disp: , Rfl:    Dapagliflozin  Pro-metFORMIN  ER (XIGDUO  XR) 09-999 MG TB24, Take 1 tablet by mouth daily., Disp: 90 tablet, Rfl: 1   rosuvastatin  (CRESTOR ) 40 MG tablet, Take 1 tablet (40 mg total) by mouth daily., Disp: 90 tablet, Rfl: 1   valsartan   (DIOVAN ) 320 MG tablet, Take 1 tablet (320 mg total) by mouth daily., Disp: 90 tablet, Rfl: 1  No Known Allergies  I personally reviewed active problem list, medication list, allergies, family history with the patient/caregiver today.   ROS  Ten systems reviewed and is negative except as mentioned in HPI    Objective Physical Exam  Constitutional: Patient appears well-developed and well-nourished.  No distress.  HEENT: head atraumatic, normocephalic, pupils equal and reactive to light, neck supple Cardiovascular: Normal rate, regular rhythm and normal heart sounds.  No murmur heard. No BLE edema. Pulmonary/Chest: Effort normal , rhonchi bilaterally that improved with cough.  No respiratory distress. Abdominal: Soft.  There is no tenderness. Psychiatric: Patient has a normal mood and affect. behavior is normal. Judgment and thought content normal.   Vitals:   09/07/24 0830  BP: 128/82  Pulse: 76  Resp: 16  SpO2: 95%  Weight: 197 lb (89.4 kg)  Height: 5' 10 (1.778 m)    Body mass index is 28.27 kg/m.  Recent Results (from the past 2160 hours)  POCT glycosylated hemoglobin (Hb A1C)     Status: Abnormal   Collection Time: 09/07/24  8:33 AM  Result Value Ref Range   Hemoglobin A1C 5.9 (A) 4.0 - 5.6 %   HbA1c POC (<> result, manual entry)     HbA1c, POC (prediabetic range)     HbA1c, POC (controlled diabetic range)      Diabetic Foot Exam:  Diabetic foot exam was performed with the following findings:   No deformities, ulcerations, or other skin breakdown Normal sensation of 10g monofilament Intact posterior tibialis and dorsalis pedis pulses      PHQ2/9:    09/07/2024    8:26 AM 05/04/2024    8:31 AM 01/20/2024    9:08 AM 10/14/2023    9:18 AM 07/15/2023    9:12 AM  Depression screen PHQ 2/9  Decreased Interest 0 0 0 0 0  Down, Depressed, Hopeless 0 0 0 0 0  PHQ - 2 Score 0 0 0 0 0  Altered sleeping   0 0 0  Tired, decreased energy   0 0 3  Change in  appetite   0 0 0  Feeling bad or failure about yourself    0 0 0  Trouble concentrating   0 0 0  Moving slowly or fidgety/restless   0 0 0  Suicidal thoughts   0 0 0  PHQ-9 Score   0 0 3  Difficult doing work/chores   Not difficult at all  Not difficult at all    phq 9 is negative  Fall Risk:    09/07/2024    8:25 AM 05/04/2024    8:31 AM 01/20/2024    9:08 AM 10/14/2023    9:18 AM 07/15/2023    9:12 AM  Fall Risk   Falls in the past year? 0 0 0 0 0  Number  falls in past yr: 0 0 0 0   Injury with Fall? 0 0 0 0   Risk for fall due to : No Fall Risks No Fall Risks No Fall Risks No Fall Risks No Fall Risks  Follow up Falls evaluation completed Falls prevention discussed;Education provided;Falls evaluation completed Falls prevention discussed;Education provided;Falls evaluation completed Falls prevention discussed Falls prevention discussed      Assessment & Plan 1. Dyslipidemia associated with type 2 diabetes mellitus (HCC) (Primary)  - HM Diabetes Foot Exam - POCT glycosylated hemoglobin (Hb A1C) - Dapagliflozin  Pro-metFORMIN  ER (XIGDUO  XR) 09-999 MG TB24; Take 1 tablet by mouth daily.  Dispense: 90 tablet; Refill: 1 - rosuvastatin  (CRESTOR ) 40 MG tablet; Take 1 tablet (40 mg total) by mouth daily.  Dispense: 90 tablet; Refill: 1  2. Hypertension associated with type 2 diabetes mellitus (HCC)  - valsartan  (DIOVAN ) 320 MG tablet; Take 1 tablet (320 mg total) by mouth daily.  Dispense: 90 tablet; Refill: 1  3. Bronchiectasis without complication (HCC)  Keep visit with pulmonologist   4. Mild intermittent asthma without complication  He has an upcoming visit with Dr. Isaiah  5. Need for influenza vaccination  - Flu vaccine trivalent PF, 6mos and older(Flulaval,Afluria,Fluarix,Fluzone)  6. Vitamin D  deficiency  Taking supplementation   7. Situs inversus  unchanged  8. Diabetes mellitus with microalbuminuria (HCC)  - Dapagliflozin  Pro-metFORMIN  ER (XIGDUO  XR) 09-999  MG TB24; Take 1 tablet by mouth daily.  Dispense: 90 tablet; Refill: 1 - valsartan  (DIOVAN ) 320 MG tablet; Take 1 tablet (320 mg total) by mouth daily.  Dispense: 90 tablet; Refill: 1  9. NAFL (nonalcoholic fatty liver)  Diagnosed by a biopsy years ago and per patient advised by GI to take vitamin E daily

## 2024-10-07 ENCOUNTER — Encounter: Payer: Self-pay | Admitting: Internal Medicine

## 2024-10-07 ENCOUNTER — Ambulatory Visit: Admitting: Internal Medicine

## 2024-10-07 VITALS — BP 120/80 | HR 65 | Temp 97.8°F | Ht 70.0 in | Wt 197.6 lb

## 2024-10-07 DIAGNOSIS — J4520 Mild intermittent asthma, uncomplicated: Secondary | ICD-10-CM | POA: Diagnosis not present

## 2024-10-07 NOTE — Patient Instructions (Signed)
 Avoid Allergens and Irritants Avoid secondhand smoke Avoid SICK contacts Recommend  Masking  when appropriate Recommend Keep up-to-date with vaccinations  Use albuterol  as needed

## 2024-10-07 NOTE — Progress Notes (Signed)
 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 use 8 years ago    FLU SHOT up to date OCT 2025  Immunization History  Administered Date(s) Administered   Influenza, Seasonal, Injecte, Preservative Fre 10/14/2023, 09/07/2024   Influenza,inj,Quad PF,6+ Mos 09/06/2014, 01/23/2016, 10/29/2016, 08/19/2017, 09/08/2018, 11/02/2019, 12/05/2020, 09/18/2021, 09/24/2022   Influenza-Unspecified 09/06/2014   PFIZER(Purple Top)SARS-COV-2 Vaccination 09/24/2020, 10/15/2020, 03/18/2021   PNEUMOCOCCAL CONJUGATE-20 06/05/2021   Pneumococcal Conjugate-13 03/03/2018   Pneumococcal Polysaccharide-23 09/06/2014   Td 02/21/2013   Tdap 04/15/2023   Zoster Recombinant(Shingrix ) 06/05/2021, 01/07/2023     BP 120/80   Pulse 65   Temp 97.8 F (36.6 C)   Ht 5' 10 (1.778 m)   Wt 197 lb 9.6 oz (89.6 kg)   SpO2 97%   BMI 28.35 kg/m     Review of Systems: Gen:  Denies  fever, sweats, chills weight loss  HEENT: Denies blurred vision, double vision, ear pain, eye pain, hearing loss, nose bleeds, sore throat Cardiac:  No dizziness, chest pain or heaviness, chest tightness,edema, No JVD Resp:   No cough, -sputum production, -shortness of breath,-wheezing, -hemoptysis,  Other:  All other systems negative   Physical Examination:   General Appearance: No distress  EYES PERRLA, EOM intact.   NECK Supple, No JVD Pulmonary: normal breath sounds, No wheezing.  CardiovascularNormal S1,S2.  No m/r/g.   Abdomen: Benign, Soft,  non-tender. Neurology UE/LE 5/5 strength, no focal deficits Ext pulses intact, cap refill intact ALL OTHER ROS ARE NEGATIVE      ASSESSMENT / PLAN:  48 year old white male with asthma  Last visit December 2023 where we discussed a plan to wean off Arnuity and assess respiratory status, At that time patient had well-controlled asthma and infrequent albuterol  use   Asthma mild well-controlled Doing well with avoidance of triggers Patient is a truck driver but avoid secondhand smoke No maintenance therapy at this time   Previous history of pneumonia with lingular opacity on chest x-ray in 2019 Follow-up chest x-ray did not show any significant abnormalities  No exacerbation at this time No evidence of heart failure at this time No evidence or signs of infection at this time No respiratory distress No fevers, chills, nausea, vomiting, diarrhea No evidence of lower extremity edema No evidence hemoptysis Avoid Allergens and Irritants Avoid secondhand smoke Avoid SICK contacts Recommend  Masking  when appropriate Recommend Keep up-to-date with vaccinations     CURRENT MEDICATIONS REVIEWED AT LENGTH WITH PATIENT TODAY   Patient  satisfied with Plan of action and management. All questions answered   Follow up 1 year   I spent a total of 42 minutes dedicated to the care of this patient on the date of this encounter to include pre-visit review of records, face-to-face time with the patient discussing conditions above, post visit ordering of testing, clinical documentation with the electronic health record, making appropriate referrals as documented, and communicating necessary information to the patient's healthcare team.    The Patient requires high complexity decision making for assessment and support, frequent evaluation and titration  of therapies, application of advanced monitoring technologies and extensive interpretation of multiple databases.  Patient satisfied with  Plan of action and management. All questions answered    Nickolas Alm Cellar, M.D.  [facility] Pulmonary & Critical Care Medicine  Medical Director [facility] [place]

## 2024-12-14 LAB — OPHTHALMOLOGY REPORT-SCANNED

## 2024-12-21 ENCOUNTER — Other Ambulatory Visit: Payer: Self-pay | Admitting: Family Medicine

## 2024-12-21 DIAGNOSIS — E1169 Type 2 diabetes mellitus with other specified complication: Secondary | ICD-10-CM

## 2024-12-22 NOTE — Telephone Encounter (Signed)
 Requested Prescriptions  Pending Prescriptions Disp Refills   OZEMPIC , 2 MG/DOSE, 8 MG/3ML SOPN [Pharmacy Med Name: Ozempic  (2 MG/DOSE) 8 MG/3ML Subcutaneous Solution Pen-injector] 9 mL 2    Sig: INJECT 2 MG ONCE A WEEK     Endocrinology:  Diabetes - GLP-1 Receptor Agonists - semaglutide  Failed - 12/22/2024  3:28 PM      Failed - HBA1C in normal range and within 180 days    Hemoglobin A1C  Date Value Ref Range Status  09/07/2024 5.9 (A) 4.0 - 5.6 % Final  07/02/2022 7.1  Final         Passed - Cr in normal range and within 360 days    Creat  Date Value Ref Range Status  05/04/2024 0.93 0.60 - 1.29 mg/dL Final   Creatinine, Urine  Date Value Ref Range Status  05/04/2024 146 20 - 320 mg/dL Final         Passed - Valid encounter within last 6 months    Recent Outpatient Visits           3 months ago Dyslipidemia associated with type 2 diabetes mellitus [facility])   [place] [facility] Sowles, Krichna, MD   7 months ago Dyslipidemia associated with type 2 diabetes mellitus [facility])   [facility] Sowles, Krichna, MD

## 2025-01-06 ENCOUNTER — Other Ambulatory Visit (HOSPITAL_COMMUNITY): Payer: Self-pay

## 2025-02-22 ENCOUNTER — Ambulatory Visit: Admitting: Family Medicine
